# Patient Record
Sex: Female | Born: 1935
Health system: Southern US, Community
[De-identification: ages and names within clinical notes are randomized; demographics above are authoritative.]

## PROBLEM LIST (undated history)

## (undated) DIAGNOSIS — I4891 Unspecified atrial fibrillation: Secondary | ICD-10-CM

## (undated) DIAGNOSIS — E785 Hyperlipidemia, unspecified: Secondary | ICD-10-CM

## (undated) DIAGNOSIS — I639 Cerebral infarction, unspecified: Secondary | ICD-10-CM

## (undated) DIAGNOSIS — I779 Disorder of arteries and arterioles, unspecified: Secondary | ICD-10-CM

## (undated) DIAGNOSIS — I2510 Atherosclerotic heart disease of native coronary artery without angina pectoris: Secondary | ICD-10-CM

## (undated) DIAGNOSIS — I10 Essential (primary) hypertension: Secondary | ICD-10-CM

## (undated) DIAGNOSIS — I739 Peripheral vascular disease, unspecified: Secondary | ICD-10-CM

## (undated) DIAGNOSIS — I6789 Other cerebrovascular disease: Secondary | ICD-10-CM

## (undated) DIAGNOSIS — I422 Other hypertrophic cardiomyopathy: Secondary | ICD-10-CM

## (undated) DIAGNOSIS — F17200 Nicotine dependence, unspecified, uncomplicated: Secondary | ICD-10-CM

## (undated) HISTORY — DX: Peripheral vascular disease, unspecified: I73.9

## (undated) HISTORY — DX: Other hypertrophic cardiomyopathy: I42.2

## (undated) HISTORY — DX: Nicotine dependence, unspecified, uncomplicated: F17.200

## (undated) HISTORY — DX: Other cerebrovascular disease: I67.89

## (undated) HISTORY — DX: Atherosclerotic heart disease of native coronary artery without angina pectoris: I25.10

## (undated) HISTORY — DX: Hyperlipidemia, unspecified: E78.5

## (undated) HISTORY — DX: Essential (primary) hypertension: I10

## (undated) HISTORY — DX: Disorder of arteries and arterioles, unspecified: I77.9

## (undated) HISTORY — PX: VEIN SURGERY: SHX48

---

## 1997-04-19 ENCOUNTER — Encounter: Admission: RE | Admit: 1997-04-19 | Discharge: 1997-04-19 | Payer: Self-pay | Admitting: Family Medicine

## 1997-04-21 ENCOUNTER — Emergency Department (HOSPITAL_COMMUNITY): Admission: EM | Admit: 1997-04-21 | Discharge: 1997-04-21 | Payer: Self-pay | Admitting: Emergency Medicine

## 1997-05-12 ENCOUNTER — Encounter: Admission: RE | Admit: 1997-05-12 | Discharge: 1997-05-12 | Payer: Self-pay | Admitting: Sports Medicine

## 1997-06-01 ENCOUNTER — Encounter: Admission: RE | Admit: 1997-06-01 | Discharge: 1997-06-01 | Payer: Self-pay | Admitting: Family Medicine

## 1997-06-13 ENCOUNTER — Encounter: Admission: RE | Admit: 1997-06-13 | Discharge: 1997-06-13 | Payer: Self-pay | Admitting: Internal Medicine

## 1997-06-15 ENCOUNTER — Encounter: Admission: RE | Admit: 1997-06-15 | Discharge: 1997-09-13 | Payer: Self-pay | Admitting: Internal Medicine

## 1997-11-24 ENCOUNTER — Encounter: Admission: RE | Admit: 1997-11-24 | Discharge: 1997-11-24 | Payer: Self-pay | Admitting: Family Medicine

## 1997-12-11 ENCOUNTER — Encounter: Payer: Self-pay | Admitting: Family Medicine

## 1997-12-11 ENCOUNTER — Inpatient Hospital Stay (HOSPITAL_COMMUNITY): Admission: EM | Admit: 1997-12-11 | Discharge: 1997-12-19 | Payer: Self-pay | Admitting: Emergency Medicine

## 1997-12-11 ENCOUNTER — Encounter: Payer: Self-pay | Admitting: Emergency Medicine

## 1997-12-14 ENCOUNTER — Encounter: Payer: Self-pay | Admitting: Family Medicine

## 1997-12-29 ENCOUNTER — Encounter: Payer: Self-pay | Admitting: Pulmonary Disease

## 1997-12-29 ENCOUNTER — Ambulatory Visit (HOSPITAL_COMMUNITY): Admission: RE | Admit: 1997-12-29 | Discharge: 1997-12-29 | Payer: Self-pay | Admitting: Pulmonary Disease

## 1998-01-16 ENCOUNTER — Encounter: Admission: RE | Admit: 1998-01-16 | Discharge: 1998-01-16 | Payer: Self-pay | Admitting: Sports Medicine

## 1998-01-29 ENCOUNTER — Encounter: Admission: RE | Admit: 1998-01-29 | Discharge: 1998-02-01 | Payer: Self-pay | Admitting: Internal Medicine

## 1998-02-14 ENCOUNTER — Encounter: Admission: RE | Admit: 1998-02-14 | Discharge: 1998-05-15 | Payer: Self-pay | Admitting: Internal Medicine

## 1998-02-15 ENCOUNTER — Encounter: Payer: Self-pay | Admitting: Pulmonary Disease

## 1998-02-15 ENCOUNTER — Ambulatory Visit (HOSPITAL_COMMUNITY): Admission: RE | Admit: 1998-02-15 | Discharge: 1998-02-15 | Payer: Self-pay | Admitting: Pulmonary Disease

## 1998-02-21 ENCOUNTER — Ambulatory Visit (HOSPITAL_COMMUNITY): Admission: RE | Admit: 1998-02-21 | Discharge: 1998-02-21 | Payer: Self-pay | Admitting: Pulmonary Disease

## 1998-02-21 ENCOUNTER — Encounter: Payer: Self-pay | Admitting: Pulmonary Disease

## 1998-02-26 ENCOUNTER — Encounter: Payer: Self-pay | Admitting: Surgery

## 1998-02-26 ENCOUNTER — Inpatient Hospital Stay (HOSPITAL_COMMUNITY): Admission: RE | Admit: 1998-02-26 | Discharge: 1998-02-28 | Payer: Self-pay | Admitting: Surgery

## 1998-08-14 ENCOUNTER — Encounter (INDEPENDENT_AMBULATORY_CARE_PROVIDER_SITE_OTHER): Payer: Self-pay | Admitting: *Deleted

## 1998-09-10 ENCOUNTER — Encounter: Admission: RE | Admit: 1998-09-10 | Discharge: 1998-09-10 | Payer: Self-pay | Admitting: Family Medicine

## 1999-03-18 ENCOUNTER — Encounter: Admission: RE | Admit: 1999-03-18 | Discharge: 1999-03-18 | Payer: Self-pay | Admitting: Sports Medicine

## 1999-03-18 ENCOUNTER — Encounter: Payer: Self-pay | Admitting: Sports Medicine

## 1999-11-21 ENCOUNTER — Encounter: Admission: RE | Admit: 1999-11-21 | Discharge: 1999-11-21 | Payer: Self-pay | Admitting: Family Medicine

## 2000-11-24 ENCOUNTER — Encounter: Admission: RE | Admit: 2000-11-24 | Discharge: 2000-11-24 | Payer: Self-pay | Admitting: Family Medicine

## 2000-11-30 ENCOUNTER — Encounter: Admission: RE | Admit: 2000-11-30 | Discharge: 2000-11-30 | Payer: Self-pay | Admitting: Sports Medicine

## 2001-06-07 ENCOUNTER — Encounter: Admission: RE | Admit: 2001-06-07 | Discharge: 2001-06-07 | Payer: Self-pay | Admitting: Sports Medicine

## 2001-06-15 ENCOUNTER — Encounter: Payer: Self-pay | Admitting: Sports Medicine

## 2001-06-15 ENCOUNTER — Encounter: Admission: RE | Admit: 2001-06-15 | Discharge: 2001-06-15 | Payer: Self-pay | Admitting: Sports Medicine

## 2001-08-05 ENCOUNTER — Encounter: Admission: RE | Admit: 2001-08-05 | Discharge: 2001-08-05 | Payer: Self-pay | Admitting: Family Medicine

## 2001-08-05 ENCOUNTER — Encounter: Payer: Self-pay | Admitting: Surgery

## 2001-08-05 ENCOUNTER — Ambulatory Visit (HOSPITAL_COMMUNITY): Admission: RE | Admit: 2001-08-05 | Discharge: 2001-08-05 | Payer: Self-pay | Admitting: Surgery

## 2001-11-19 ENCOUNTER — Encounter: Admission: RE | Admit: 2001-11-19 | Discharge: 2001-11-19 | Payer: Self-pay | Admitting: Family Medicine

## 2002-01-04 ENCOUNTER — Encounter: Admission: RE | Admit: 2002-01-04 | Discharge: 2002-01-04 | Payer: Self-pay | Admitting: Sports Medicine

## 2002-11-11 ENCOUNTER — Encounter: Admission: RE | Admit: 2002-11-11 | Discharge: 2002-11-11 | Payer: Self-pay | Admitting: Family Medicine

## 2003-10-31 ENCOUNTER — Ambulatory Visit: Payer: Self-pay | Admitting: Family Medicine

## 2004-01-29 ENCOUNTER — Ambulatory Visit: Payer: Self-pay | Admitting: Sports Medicine

## 2004-09-11 ENCOUNTER — Ambulatory Visit: Payer: Self-pay | Admitting: Family Medicine

## 2004-10-10 ENCOUNTER — Ambulatory Visit: Payer: Self-pay | Admitting: Family Medicine

## 2004-11-11 ENCOUNTER — Ambulatory Visit: Payer: Self-pay | Admitting: Sports Medicine

## 2004-11-28 ENCOUNTER — Ambulatory Visit: Payer: Self-pay | Admitting: Cardiology

## 2004-11-28 ENCOUNTER — Encounter: Payer: Self-pay | Admitting: Cardiology

## 2004-11-28 ENCOUNTER — Ambulatory Visit (HOSPITAL_COMMUNITY): Admission: RE | Admit: 2004-11-28 | Discharge: 2004-11-28 | Payer: Self-pay | Admitting: Sports Medicine

## 2004-12-11 ENCOUNTER — Ambulatory Visit: Payer: Self-pay | Admitting: Family Medicine

## 2005-04-10 ENCOUNTER — Ambulatory Visit: Payer: Self-pay | Admitting: Family Medicine

## 2005-09-24 ENCOUNTER — Ambulatory Visit: Payer: Self-pay | Admitting: Sports Medicine

## 2005-10-08 ENCOUNTER — Ambulatory Visit: Payer: Self-pay | Admitting: Family Medicine

## 2005-10-16 ENCOUNTER — Ambulatory Visit: Payer: Self-pay | Admitting: Family Medicine

## 2005-10-24 ENCOUNTER — Ambulatory Visit: Payer: Self-pay | Admitting: Family Medicine

## 2005-11-04 ENCOUNTER — Ambulatory Visit: Payer: Self-pay | Admitting: Family Medicine

## 2005-11-25 HISTORY — PX: NM MYOCAR PERF WALL MOTION: HXRAD629

## 2006-01-27 ENCOUNTER — Inpatient Hospital Stay (HOSPITAL_COMMUNITY): Admission: AD | Admit: 2006-01-27 | Discharge: 2006-01-28 | Payer: Self-pay | Admitting: Cardiology

## 2006-01-27 HISTORY — PX: CARDIAC CATHETERIZATION: SHX172

## 2006-03-12 DIAGNOSIS — B351 Tinea unguium: Secondary | ICD-10-CM

## 2006-03-12 DIAGNOSIS — I70219 Atherosclerosis of native arteries of extremities with intermittent claudication, unspecified extremity: Secondary | ICD-10-CM

## 2006-03-12 DIAGNOSIS — M1710 Unilateral primary osteoarthritis, unspecified knee: Secondary | ICD-10-CM

## 2006-03-12 DIAGNOSIS — I10 Essential (primary) hypertension: Secondary | ICD-10-CM | POA: Insufficient documentation

## 2006-03-12 DIAGNOSIS — E7800 Pure hypercholesterolemia, unspecified: Secondary | ICD-10-CM

## 2006-03-12 DIAGNOSIS — I2510 Atherosclerotic heart disease of native coronary artery without angina pectoris: Secondary | ICD-10-CM | POA: Insufficient documentation

## 2006-03-12 DIAGNOSIS — F068 Other specified mental disorders due to known physiological condition: Secondary | ICD-10-CM

## 2006-03-12 DIAGNOSIS — I6789 Other cerebrovascular disease: Secondary | ICD-10-CM

## 2006-03-12 DIAGNOSIS — J449 Chronic obstructive pulmonary disease, unspecified: Secondary | ICD-10-CM | POA: Insufficient documentation

## 2006-03-12 DIAGNOSIS — F17200 Nicotine dependence, unspecified, uncomplicated: Secondary | ICD-10-CM

## 2006-03-12 DIAGNOSIS — I739 Peripheral vascular disease, unspecified: Secondary | ICD-10-CM

## 2006-03-12 HISTORY — DX: Nicotine dependence, unspecified, uncomplicated: F17.200

## 2006-03-12 HISTORY — DX: Other cerebrovascular disease: I67.89

## 2006-03-13 ENCOUNTER — Encounter (INDEPENDENT_AMBULATORY_CARE_PROVIDER_SITE_OTHER): Payer: Self-pay | Admitting: *Deleted

## 2006-04-17 ENCOUNTER — Encounter (INDEPENDENT_AMBULATORY_CARE_PROVIDER_SITE_OTHER): Payer: Self-pay | Admitting: Family Medicine

## 2006-06-15 ENCOUNTER — Telehealth: Payer: Self-pay | Admitting: *Deleted

## 2006-06-29 ENCOUNTER — Telehealth (INDEPENDENT_AMBULATORY_CARE_PROVIDER_SITE_OTHER): Payer: Self-pay | Admitting: Family Medicine

## 2006-08-03 ENCOUNTER — Telehealth (INDEPENDENT_AMBULATORY_CARE_PROVIDER_SITE_OTHER): Payer: Self-pay | Admitting: Family Medicine

## 2006-08-04 ENCOUNTER — Telehealth (INDEPENDENT_AMBULATORY_CARE_PROVIDER_SITE_OTHER): Payer: Self-pay | Admitting: *Deleted

## 2006-08-12 ENCOUNTER — Encounter (INDEPENDENT_AMBULATORY_CARE_PROVIDER_SITE_OTHER): Payer: Self-pay | Admitting: Family Medicine

## 2006-08-12 ENCOUNTER — Ambulatory Visit: Payer: Self-pay | Admitting: Family Medicine

## 2006-08-12 DIAGNOSIS — I2510 Atherosclerotic heart disease of native coronary artery without angina pectoris: Secondary | ICD-10-CM | POA: Insufficient documentation

## 2006-08-12 LAB — CONVERTED CEMR LAB
ALT: <8 units/L (ref 0–35)
BUN: 15 mg/dL (ref 6–23)
CO2: 27 meq/L (ref 19–32)
Chloride: 110 meq/L (ref 96–112)
Creatinine, Ser: 0.95 mg/dL (ref 0.40–1.20)
Direct LDL: 117 mg/dL — ABNORMAL HIGH
Glucose, Bld: 98 mg/dL (ref 70–99)
HCT: 35.6 % — ABNORMAL LOW (ref 36.0–46.0)
Hemoglobin: 11.7 g/dL — ABNORMAL LOW (ref 12.0–15.0)
MCHC: 32.9 g/dL (ref 30.0–36.0)
MCV: 93.7 fL (ref 78.0–100.0)
Platelets: 193 10*3/uL (ref 150–400)
RDW: 13.6 % (ref 11.5–14.0)
Total Bilirubin: 0.5 mg/dL (ref 0.3–1.2)
Total Protein: 7.3 g/dL (ref 6.0–8.3)

## 2006-11-20 ENCOUNTER — Ambulatory Visit: Payer: Self-pay | Admitting: Family Medicine

## 2007-06-11 ENCOUNTER — Encounter (INDEPENDENT_AMBULATORY_CARE_PROVIDER_SITE_OTHER): Payer: Self-pay | Admitting: Family Medicine

## 2007-06-11 ENCOUNTER — Ambulatory Visit: Payer: Self-pay | Admitting: Family Medicine

## 2007-06-14 LAB — CONVERTED CEMR LAB
BUN: 11 mg/dL (ref 6–23)
Calcium: 9.2 mg/dL (ref 8.4–10.5)
Creatinine, Ser: 0.69 mg/dL (ref 0.40–1.20)
Hemoglobin: 11.6 g/dL — ABNORMAL LOW (ref 12.0–15.0)
Platelets: 201 10*3/uL (ref 150–400)
Triglycerides: 46 mg/dL (ref <150)
WBC: 3.6 10*3/uL — ABNORMAL LOW (ref 4.0–10.5)

## 2007-10-27 ENCOUNTER — Ambulatory Visit: Payer: Self-pay | Admitting: Family Medicine

## 2007-11-29 ENCOUNTER — Telehealth: Payer: Self-pay | Admitting: *Deleted

## 2007-12-24 ENCOUNTER — Telehealth: Payer: Self-pay | Admitting: *Deleted

## 2008-01-05 ENCOUNTER — Telehealth: Payer: Self-pay | Admitting: *Deleted

## 2008-01-24 ENCOUNTER — Ambulatory Visit: Payer: Self-pay | Admitting: Family Medicine

## 2008-02-01 ENCOUNTER — Encounter: Payer: Self-pay | Admitting: Family Medicine

## 2008-07-18 ENCOUNTER — Ambulatory Visit: Payer: Self-pay | Admitting: Family Medicine

## 2008-10-09 ENCOUNTER — Encounter: Payer: Self-pay | Admitting: Family Medicine

## 2008-10-12 ENCOUNTER — Encounter: Admission: RE | Admit: 2008-10-12 | Discharge: 2008-10-12 | Payer: Self-pay | Admitting: Family Medicine

## 2008-10-16 ENCOUNTER — Encounter: Payer: Self-pay | Admitting: Family Medicine

## 2008-10-23 ENCOUNTER — Ambulatory Visit: Payer: Self-pay | Admitting: Family Medicine

## 2008-12-03 ENCOUNTER — Encounter: Payer: Self-pay | Admitting: Family Medicine

## 2008-12-06 ENCOUNTER — Encounter: Payer: Self-pay | Admitting: Family Medicine

## 2008-12-19 ENCOUNTER — Encounter: Payer: Self-pay | Admitting: Family Medicine

## 2009-02-05 ENCOUNTER — Telehealth: Payer: Self-pay | Admitting: Family Medicine

## 2009-07-10 ENCOUNTER — Ambulatory Visit: Payer: Self-pay | Admitting: Family Medicine

## 2009-08-12 ENCOUNTER — Emergency Department (HOSPITAL_COMMUNITY): Admission: EM | Admit: 2009-08-12 | Discharge: 2009-08-12 | Payer: Self-pay | Admitting: Emergency Medicine

## 2009-10-26 ENCOUNTER — Ambulatory Visit: Payer: Self-pay | Admitting: Family Medicine

## 2009-12-10 ENCOUNTER — Encounter: Payer: Self-pay | Admitting: Family Medicine

## 2009-12-10 DIAGNOSIS — I5032 Chronic diastolic (congestive) heart failure: Secondary | ICD-10-CM

## 2010-02-02 ENCOUNTER — Encounter: Payer: Self-pay | Admitting: Family Medicine

## 2010-02-14 NOTE — Assessment & Plan Note (Signed)
Summary: flu shot,tcb   Nurse Visit   Vital Signs:  Patient profile:   77 year old female Temp:     98.4 degrees F  Vitals Entered By: Theresia Lo RN (October 26, 2009 3:10 PM)  Allergies: No Known Drug Allergies  Immunizations Administered:  Influenza Vaccine # 1:    Vaccine Type: Fluvax MCR    Site: right deltoid    Mfr: GlaxoSmithKline    Dose: 0.5 ml    Route: IM    Given by: Theresia Lo RN    Exp. Date: 07/10/2010    Lot #: [id number]    VIS given: 08/07/09 version given October 26, 2009.  Flu Vaccine Consent Questions:    Do you have a history of severe allergic reactions to this vaccine? no    Any prior history of allergic reactions to egg and/or gelatin? no    Do you have a sensitivity to the preservative Thimersol? no    Do you have a past history of Guillan-Barre Syndrome? no    Do you currently have an acute febrile illness? no    Have you ever had a severe reaction to latex? no    Vaccine information given and explained to patient? yes    Are you currently pregnant? no  Orders Added: 1)  Influenza Vaccine MCR [00025] 2)  Administration Flu vaccine - MCR [G0008]     Vital Signs:  Patient profile:   77 year old female Temp:     98.4 degrees F  Vitals Entered By: Theresia Lo RN (October 26, 2009 3:10 PM)

## 2010-02-14 NOTE — Miscellaneous (Signed)
Summary: QI project   Clinical Lists Changes  Problems: Removed problem of NEED PROPHYLACTIC VACCINATION&INOCULATION FLU (ICD-V04.81) Removed problem of NEED PROPHYLACTIC VACCINATION&INOCULATION FLU (ICD-V04.81) Removed problem of CHF, EJECTION FRACTION > OR = 50% (ICD-428.32) Added new problem of CHRONIC DIASTOLIC HEART FAILURE (ICD-428.32)

## 2010-02-14 NOTE — Progress Notes (Signed)
Summary: Rx Req   Phone Note Refill Request Call back at [phone] Message from:  Tilman Neat Neice  Refills Requested: Medication #1:  COMBIVENT 103-18 MCG/ACT  AERO 1 puff qid PT [facility]  Initial call taken by: Clydell Hakim,  February 05, 2009 3:40 PM  Follow-up for Phone Call        will forward to MD. Follow-up by: Theresia Lo RN,  February 05, 2009 3:45 PM    Prescriptions: COMBIVENT 103-18 MCG/ACT  AERO (ALBUTEROL-IPRATROPIUM) 1 puff qid  #1 x 1   Entered and Authorized by:   Angeline Slim MD   Signed by:   Angeline Slim MD on 02/05/2009   Method used:   [facility]ically to        [facility] [address] [phone]* (retail)       [address]       [place], [place]  [address]       Ph: [phone] or [phone]       Fax: [phone]   RxID:   [id number]

## 2010-02-14 NOTE — Assessment & Plan Note (Signed)
Summary: knee pain,tcb   Vital Signs:  Patient profile:   77 year old female Weight:      130.9 pounds Temp:     97.9 degrees F oral Pulse rate:   57 / minute Pulse rhythm:   regular BP sitting:   153 / 66  (right arm)  Vitals Entered By: Loralee Pacas CMA (July 10, 2009 10:10 AM)  Serial Vital Signs/Assessments:  Time      Position  BP       Pulse  Resp  Temp     By                     136/60                         Rodney Langton MD   Primary Care Provider:  Jamie Brookes MD   History of Present Illness: Pt here with c/o L knee pain.  Pain:  has actually since resolved.  Not taking ibuprofen but is taking ASA 325 regularly.  Pain was random, not associated with activity, no swelling, no redness, no fevers/chills, no injury, no popping/catching/locking.  Was present posteromedial knee.  Hx blood clot.  HTN:  BP elevated today.  CAD:  Was taking ASA 81 before switching to 325.  Current Medications (verified): 1)  Norvasc 10 Mg  Tabs (Amlodipine Besylate) .Marland Kitchen.. 1 Tab By Mouth Daily 2)  Zocor 40 Mg  Tabs (Simvastatin) .Marland Kitchen.. 1 Tab By Mouth Daily 3)  Imdur 30 Mg  Tb24 (Isosorbide Mononitrate) .Marland Kitchen.. 1 Tab By Mouth Daily 4)  Hydrochlorothiazide 25 Mg  Tabs (Hydrochlorothiazide) .Marland Kitchen.. 1 Tab By Mouth Daily 5)  Combivent 103-18 Mcg/act  Aero (Albuterol-Ipratropium) .Marland Kitchen.. 1 Puff Qid 6)  Lopressor 50 Mg  Tabs (Metoprolol Tartrate) .Marland Kitchen.. 1 Tab By Mouth Two Times A Day. 7)  Adult Aspirin Ec Low Strength 81 Mg  Tbec (Aspirin) .Marland Kitchen.. 1 Tab By Mouth Daily 8)  Oscal 500/200 D-3 500-200 Mg-Unit  Tabs (Calcium-Vitamin D) .Marland Kitchen.. 1 Tab By Mouth Bid 9)  Tylenol Arthritis Pain 650 Mg Cr-Tabs (Acetaminophen) .... One Tab By Mouth Q8h As Needed For Knee Pain  Allergies (verified): No Known Drug Allergies  Physical Exam  General:  Well-developed,well-nourished,in no acute distress; alert,appropriate and cooperative throughout examination Lungs:  Normal respiratory effort, chest expands  symmetrically. Lungs are clear to auscultation, no crackles or wheezes. Heart:  Normal rate and regular rhythm. S1 and S2 normal without gallop, murmur, click, rub or other extra sounds. Msk:  Knee:L Normal to inspection with no erythema or effusion or obvious bony abnormalities. Palpation normal with no warmth or joint line tenderness or patellar tenderness or condyle tenderness. ROM normal in flexion and extension and lower leg rotation. Ligaments with solid consistent endpoints including ACL, PCL, LCL, MCL. Negative Mcmurray's and provocative meniscal tests. Non painful patellar compression. Patellar and quadriceps tendons unremarkable. Hamstring and quadriceps strength is normal.     Pt pointed to spot where it was originally tender which appears to be over the medial head of the gastrocnemius.  No longer tender.  Homan's Negative.   Impression & Recommendations:  Problem # 1:  OSTEOARTHRITIS, LOWER LEG (ICD-715.96) Assessment Improved Posteromedial joint OA may be the cause of her knee pain,  all ligamentous structures intact, pain gone with NSAIDS so no further tx.  Would like her to go back to ASA 81 and try tylenol 650 for her knee pain as gastritis/pud is a concern  in this elderly lady.  She can RTC if pain recurs  on tylenol and I would be happy to try a therapeutic/diagnostic knee injection.  Her updated medication list for this problem includes:    Adult Aspirin Ec Low Strength 81 Mg Tbec (Aspirin) .Marland Kitchen... 1 tab by mouth daily    Tylenol Arthritis Pain 650 Mg Cr-tabs (Acetaminophen) ..... One tab by mouth q8h as needed for knee pain  Orders: [facility]- Est  Level 4 (16109)  Problem # 2:  CORONARY ARTERY DISEASE (ICD-414.00) Assessment: Unchanged Back to ASA 81.  Her updated medication list for this problem includes:    Norvasc 10 Mg Tabs (Amlodipine besylate) .Marland Kitchen... 1 tab by mouth daily    Imdur 30 Mg Tb24 (Isosorbide mononitrate) .Marland Kitchen... 1 tab by mouth daily     Hydrochlorothiazide 25 Mg Tabs (Hydrochlorothiazide) .Marland Kitchen... 1 tab by mouth daily    Lopressor 50 Mg Tabs (Metoprolol tartrate) .Marland Kitchen... 1 tab by mouth two times a day.    Adult Aspirin Ec Low Strength 81 Mg Tbec (Aspirin) .Marland Kitchen... 1 tab by mouth daily  Orders: [facility]- Est  Level 4 (60454)  Problem # 3:  HYPERTENSION, BENIGN SYSTEMIC (ICD-401.1) Assessment: Improved BP better on recheck, no changes.  Her updated medication list for this problem includes:    Norvasc 10 Mg Tabs (Amlodipine besylate) .Marland Kitchen... 1 tab by mouth daily    Hydrochlorothiazide 25 Mg Tabs (Hydrochlorothiazide) .Marland Kitchen... 1 tab by mouth daily    Lopressor 50 Mg Tabs (Metoprolol tartrate) .Marland Kitchen... 1 tab by mouth two times a day.  Orders: [facility]- Est  Level 4 (99214)  Complete Medication List: 1)  Norvasc 10 Mg Tabs (Amlodipine besylate) .Marland Kitchen.. 1 tab by mouth daily 2)  Zocor 40 Mg Tabs (Simvastatin) .Marland Kitchen.. 1 tab by mouth daily 3)  Imdur 30 Mg Tb24 (Isosorbide mononitrate) .Marland Kitchen.. 1 tab by mouth daily 4)  Hydrochlorothiazide 25 Mg Tabs (Hydrochlorothiazide) .Marland Kitchen.. 1 tab by mouth daily 5)  Combivent 103-18 Mcg/act Aero (Albuterol-ipratropium) .Marland Kitchen.. 1 puff qid 6)  Lopressor 50 Mg Tabs (Metoprolol tartrate) .Marland Kitchen.. 1 tab by mouth two times a day. 7)  Adult Aspirin Ec Low Strength 81 Mg Tbec (Aspirin) .Marland Kitchen.. 1 tab by mouth daily 8)  Oscal 500/200 D-3 500-200 Mg-unit Tabs (Calcium-vitamin d) .Marland Kitchen.. 1 tab by mouth bid 9)  Tylenol Arthritis Pain 650 Mg Cr-tabs (Acetaminophen) .... One tab by mouth q8h as needed for knee pain  Patient Instructions: 1)  Great to meet you today, 2)  Stop your aspirin 325 mg and go back to a baby aspirin (81mg ) a day for now, try tylenol 650 if your knee pain comes back. 3)  -Dr. Karie Schwalbe. Prescriptions: TYLENOL ARTHRITIS PAIN 650 MG CR-TABS (ACETAMINOPHEN) One tab by mouth q8h as needed for knee pain  #30 x 6   Entered and Authorized by:   Rodney Langton MD   Signed by:   Rodney Langton MD on 07/10/2009   Method used:   Print  then Give to Patient   RxID:   [id number]

## 2010-03-12 ENCOUNTER — Other Ambulatory Visit: Payer: Self-pay | Admitting: Family Medicine

## 2010-03-12 NOTE — Telephone Encounter (Signed)
Refill request

## 2010-04-11 ENCOUNTER — Other Ambulatory Visit: Payer: Self-pay | Admitting: Family Medicine

## 2010-04-11 ENCOUNTER — Encounter: Payer: Self-pay | Admitting: Family Medicine

## 2010-04-11 ENCOUNTER — Ambulatory Visit (INDEPENDENT_AMBULATORY_CARE_PROVIDER_SITE_OTHER): Payer: Medicare Other | Admitting: Family Medicine

## 2010-04-11 VITALS — BP 185/70 | HR 64 | Temp 97.6°F | Wt 132.0 lb

## 2010-04-11 DIAGNOSIS — I10 Essential (primary) hypertension: Secondary | ICD-10-CM

## 2010-04-11 DIAGNOSIS — R2689 Other abnormalities of gait and mobility: Secondary | ICD-10-CM | POA: Insufficient documentation

## 2010-04-11 DIAGNOSIS — R269 Unspecified abnormalities of gait and mobility: Secondary | ICD-10-CM

## 2010-04-11 MED ORDER — ISOSORBIDE MONONITRATE ER 30 MG PO TB24: 30.0000 mg | ORAL_TABLET | Freq: Every day | ORAL | Status: DC

## 2010-04-11 MED ORDER — METOPROLOL TARTRATE 50 MG PO TABS: 50.0000 mg | ORAL_TABLET | Freq: Two times a day (BID) | ORAL | Status: DC

## 2010-04-11 NOTE — Patient Instructions (Signed)
I will call you with results.  Keep exercising. And use the medicines as directed including the inhaler.

## 2010-04-12 LAB — BASIC METABOLIC PANEL
BUN: 14 mg/dL (ref 6–23)
Calcium: 9.2 mg/dL (ref 8.4–10.5)
Chloride: 107 mEq/L (ref 96–112)
Potassium: 4 mEq/L (ref 3.5–5.3)
Sodium: 142 mEq/L (ref 135–145)

## 2010-04-12 LAB — CBC
Hemoglobin: 12.2 g/dL (ref 12.0–15.0)
Platelets: 172 10*3/uL (ref 150–400)
RDW: 13.7 % (ref 11.5–15.5)
WBC: 4.8 10*3/uL (ref 4.0–10.5)

## 2010-04-15 ENCOUNTER — Telehealth: Payer: Self-pay | Admitting: *Deleted

## 2010-04-15 NOTE — Telephone Encounter (Signed)
Informed of lab work being normal

## 2010-04-15 NOTE — Telephone Encounter (Signed)
Message copied by Jimmy Footman on Mon Apr 15, 2010  9:32 AM ------      Message from: Clotilde Dieter, [place]      Created: Mon Apr 15, 2010  9:05 AM      Regarding: Please call this patient.        Please let this patient know that her labs and completely normal.       Thanks.             ----- Message -----         From: Lab In [facility] Interface         Sent: 04/12/2010   2:37 AM           To: Jamie Brookes

## 2010-04-15 NOTE — Assessment & Plan Note (Signed)
Pt has a high BP today. Unclear why it is so high as the patient says she is taking all her meds.  I am concerned with her slow gait and left knee pain that if I drop her BP she may also become dizzy and unbalanced. Will not change her meds at this time but if it is high the next time she comes in I would add a 4th medication or increase her Metoprolol.

## 2010-04-15 NOTE — Assessment & Plan Note (Signed)
Pt has some left knee pain off and on for many months if not years (daughter and sister disagree). She fell about 4 months ago but was hurting even before that. She does not use a walking or cane and generally has good balance when she is walking. She is just slow. Advised to not stop moving and to keep going to the gym with her sister. Pt is not SOB and oxygen while sitting and walking are above 92%.  No new care plans at this time.

## 2010-04-15 NOTE — Progress Notes (Signed)
Knee pain and unsteady gait: Pt has had a slow gait and left knee pain for several months if not years. Her daughter and her sister do not agree on the timeframe but it is clear that she has OA and is taking Tylenol for her pain. She did fall about 4 months ago but says that her knee was hurting before that as well. She is continuing to go to a gym and exercise classes but is concerned because she is the slowest person there. She wants to know if there is something wrong with her. She says she use to smoke for about 60 years but does not necessarily feel short of breath.   HTN: Pt has a high BP today. She is taking all her medications and has brought them in today. Her BP is high today but with her story of a fall a few months ago and her feeling even more slow, weak and with unsteady gait/knee pain she likely does not need low blood pressure as well.   ROS: neg except for aches and pains with her joints at times. She has OA.   PE:  Gen: NAD, sitting on bed comfortably.  HEENT: [place]/AT, eye conjunctiva is normal and pink on the lower lid. EOMI, PERRL CV: RRR, no murmurs Pulm: CTAB, no wheezes  Ext: some left knee tenderness and enlargement, no significant swelling, no erythema or fluctuance.

## 2010-04-17 ENCOUNTER — Encounter: Payer: Self-pay | Admitting: Home Health Services

## 2010-05-04 ENCOUNTER — Other Ambulatory Visit: Payer: Self-pay | Admitting: Family Medicine

## 2010-05-05 NOTE — Telephone Encounter (Signed)
Refill request

## 2010-05-14 ENCOUNTER — Other Ambulatory Visit: Payer: Self-pay | Admitting: Family Medicine

## 2010-05-14 NOTE — Telephone Encounter (Signed)
Refill request

## 2010-05-31 NOTE — Discharge Summary (Signed)
Bethany Cardenas, Bethany Cardenas               ACCOUNT NO.:  [id number]   MEDICAL RECORD NO.:  [id number]          PATIENT TYPE:  INP   LOCATION:  3735                         FACILITY:  [facility]   PHYSICIAN:  Madaline Savage, M.D.DATE OF BIRTH:  1922-10-20   DATE OF ADMISSION:  01/27/2006  DATE OF DISCHARGE:  01/28/2006                               DISCHARGE SUMMARY   DISCHARGE DIAGNOSIS:  1. Severe three vessel coronary disease with an 80% left main and      normal left ventricular function not felt to be an operative      candidate.  2. Known peripheral vascular disease.  3. Treated hypertension with ACE inhibitor intolerance.  4. Treated dyslipidemia.  5. Chronic obstructive pulmonary disease.   HOSPITAL COURSE:  The patient is a pleasant 77 year old female who is  followed by Dr. Deirdre Priest.  She was referred to Dr. Elsie Lincoln for cardiac  evaluation.  She had an outpatient Cardiolite which was abnormal.  She  also had markedly diminished ABIs at 0.42 on the right and 0.39 on the  left.  She has moderate disease in her internal carotids bilaterally.  She was set up for diagnostic catheterization as an outpatient which was  done January 27, 2006.  This revealed total native LAD, total native  circumflex, total native RCA.  There was some right to right collaterals  and some right to left collaterals.  Her EF was 60%.  There was also an  80% left main stenosis noted.  The patient was transferred to [place] and put on heparin and nitrates.  She was seen in consult by  Dr. Rexanne Mano.  Dr. Laneta Simmers felt ultimately that she was a poor  candidate for bypass surgery.  She has had a prior stroke, she has  diffuse vascular disease, there is some degree of dementia, and she has  COPD.  The plan is for medical therapy.  We did add nitrate.  We feel  she can be discharged January 28, 2006.   DISCHARGE MEDICATIONS:  HCTZ 25 mg a day, Toprol XL 100 mg a day, Pletal  100 mg b.i.d., simvastatin  40 mg a day, coated aspirin daily, Norvasc 10  mg a day, Imdur 30 mg a day, nitroglycerin sublingual p.r.n.   LABORATORY DATA:  White count 6.6, hemoglobin 10.9, hematocrit 33.2,  platelets 158.  Sodium 139, potassium 4, BUN 10, creatinine 0.8.  Liver  functions were normal.  LDL was 71.  TSH is pending.   DISPOSITION:  The patient is discharged in stable condition.  She lives  with her sister.  She will follow up with Dr. gamble and Dr. Deirdre Priest  as an outpatient.      Abelino Derrick, P.A.    ______________________________  Madaline Savage, M.D.    Lenard Lance  D:  01/28/2006  T:  01/28/2006  Job:  [id number]   cc:   Pearlean Brownie, M.D.  Evelene Croon, M.D.

## 2010-05-31 NOTE — H&P (Signed)
Bethany Cardenas, Bethany Cardenas               ACCOUNT NO.:  [id number]   MEDICAL RECORD NO.:  [id number]          PATIENT TYPE:  INP   LOCATION:  3735                         FACILITY:  [facility]   PHYSICIAN:  Madaline Savage, M.D.DATE OF BIRTH:  06/05/1922   DATE OF ADMISSION:  01/27/2006  DATE OF DISCHARGE:  01/28/2006                              HISTORY & PHYSICAL   Dictation ended at this point.      Abelino Derrick, P.A.    ______________________________  Madaline Savage, M.D.    Lenard Lance  D:  03/17/2006  T:  03/17/2006  Job:  [id number]

## 2010-08-15 ENCOUNTER — Other Ambulatory Visit: Payer: Self-pay | Admitting: Family Medicine

## 2010-08-15 NOTE — Telephone Encounter (Signed)
Refill request

## 2010-08-16 NOTE — Telephone Encounter (Signed)
Refill request

## 2010-11-14 ENCOUNTER — Ambulatory Visit (INDEPENDENT_AMBULATORY_CARE_PROVIDER_SITE_OTHER): Payer: Medicare Other | Admitting: *Deleted

## 2010-11-14 DIAGNOSIS — Z23 Encounter for immunization: Secondary | ICD-10-CM

## 2011-01-16 ENCOUNTER — Other Ambulatory Visit: Payer: Self-pay | Admitting: Family Medicine

## 2011-01-16 NOTE — Telephone Encounter (Signed)
Refill request

## 2011-01-24 DIAGNOSIS — Q828 Other specified congenital malformations of skin: Secondary | ICD-10-CM | POA: Diagnosis not present

## 2011-01-24 DIAGNOSIS — M79609 Pain in unspecified limb: Secondary | ICD-10-CM | POA: Diagnosis not present

## 2011-01-24 DIAGNOSIS — B351 Tinea unguium: Secondary | ICD-10-CM | POA: Diagnosis not present

## 2011-04-04 DIAGNOSIS — Q828 Other specified congenital malformations of skin: Secondary | ICD-10-CM | POA: Diagnosis not present

## 2011-04-04 DIAGNOSIS — B351 Tinea unguium: Secondary | ICD-10-CM | POA: Diagnosis not present

## 2011-04-04 DIAGNOSIS — M79609 Pain in unspecified limb: Secondary | ICD-10-CM | POA: Diagnosis not present

## 2011-04-25 ENCOUNTER — Ambulatory Visit (INDEPENDENT_AMBULATORY_CARE_PROVIDER_SITE_OTHER): Payer: Medicare Other | Admitting: Family Medicine

## 2011-04-25 ENCOUNTER — Encounter: Payer: Self-pay | Admitting: Family Medicine

## 2011-04-25 VITALS — BP 130/62 | HR 64 | Temp 98.1°F | Wt 132.0 lb

## 2011-04-25 DIAGNOSIS — Z23 Encounter for immunization: Secondary | ICD-10-CM

## 2011-04-25 DIAGNOSIS — I509 Heart failure, unspecified: Secondary | ICD-10-CM

## 2011-04-25 DIAGNOSIS — IMO0002 Reserved for concepts with insufficient information to code with codable children: Secondary | ICD-10-CM | POA: Diagnosis not present

## 2011-04-25 DIAGNOSIS — M1710 Unilateral primary osteoarthritis, unspecified knee: Secondary | ICD-10-CM

## 2011-04-25 DIAGNOSIS — I5032 Chronic diastolic (congestive) heart failure: Secondary | ICD-10-CM | POA: Diagnosis not present

## 2011-04-25 MED ORDER — ACETAMINOPHEN ER 650 MG PO TBCR: 650.0000 mg | EXTENDED_RELEASE_TABLET | Freq: Two times a day (BID) | ORAL | Status: DC

## 2011-04-25 MED ORDER — POTASSIUM CHLORIDE ER 10 MEQ PO TBCR: 20.0000 meq | EXTENDED_RELEASE_TABLET | Freq: Every day | ORAL | Status: DC

## 2011-04-25 MED ORDER — FUROSEMIDE 40 MG PO TABS: 40.0000 mg | ORAL_TABLET | Freq: Every day | ORAL | Status: DC

## 2011-04-25 MED ORDER — ZOSTER VACCINE LIVE 19400 UNT/0.65ML ~~LOC~~ SOLR: 0.6500 mL | Freq: Once | SUBCUTANEOUS | Status: AC

## 2011-04-25 NOTE — Progress Notes (Signed)
  Subjective:    Patient ID: Bethany Cardenas, female    DOB: December 27, 1922, 78 y.o.   MRN: [id number]  HPI 1. SOB Patient has a dx of CHF. She has SOB with minimal exertion. No sob at night. No GERD symptoms. No respiratory distress today. She has an inhaler for COPD as well. No wheezing noted. She has dull sounds on ausculation. She has controlled BP today.   2. Bilateral knee pain Patient has knee pain bilaterally, without joint swelling or injury. She has worse pain in the morning. No other joints. Blood supply appears normal to lower ext. Pulses palpable and skin healthy. Negative homen's sign. She does have trace pitting edema of the lower ext.   Tobacco use: Patient is a non-smoker.   Review of Systems Pertinent items are noted in HPI.     Objective:   Physical Exam Filed Vitals:   04/25/11 1046  BP: 130/62  Pulse: 64  Temp: 98.1 F (36.7 C)  TempSrc: Oral  Weight: 132 lb (59.875 kg)  SpO2: 97%  Neck:  No deformities, thyromegaly, masses, or tenderness noted.   Supple with full range of motion without pain.Marland Kitchen Heart - Regular rate and rhythm.  No murmurs, gallops or rubs.    Lungs:  Chest expands symmetrically. Lungs are dull to auscultation with poor insp. effort, no crackles or wheezes. Extremities:   Non-tender, No cyanosis, edema, or deformity noted. DP and PT pulses palpable. onychomycosis bilateral feet.  Skin:  Intact without suspicious lesions or rashes        Assessment & Plan:

## 2011-04-25 NOTE — Patient Instructions (Signed)
It was great to see you today!  Schedule an appointment to see me in 2 weeks.

## 2011-04-25 NOTE — Progress Notes (Signed)
Addended by: Deno Etienne on: 04/25/2011 01:31 PM   Modules accepted: Orders

## 2011-04-25 NOTE — Assessment & Plan Note (Signed)
C/o bilateral knee pain that is classic for OA Meds ordered this encounter  Medications  . zoster vaccine live, PF, (ZOSTAVAX) 46962 UNT/0.65ML injection    Sig: Inject 19,400 Units into the skin once.    Dispense:  1 each    Refill:  0  . furosemide (LASIX) 40 MG tablet    Sig: Take 1 tablet (40 mg total) by mouth daily.    Dispense:  14 tablet    Refill:  0    14 days of lasix for leg edema, SOB  . potassium chloride (K-DUR) 10 MEQ tablet    Sig: Take 2 tablets (20 mEq total) by mouth daily.    Dispense:  30 tablet    Refill:  0  . acetaminophen (TYLENOL 8 HOUR) 650 MG CR tablet    Sig: Take 1 tablet (650 mg total) by mouth every 12 (twelve) hours.    Dispense:  60 tablet    Refill:  6

## 2011-04-25 NOTE — Assessment & Plan Note (Signed)
Patient has trace pitting edema and sob with minimal exertion. I will treat her with Lasix 40 mg daily for the next two weeks and reevaluate.

## 2011-05-06 ENCOUNTER — Encounter: Payer: Self-pay | Admitting: Family Medicine

## 2011-05-06 ENCOUNTER — Ambulatory Visit (INDEPENDENT_AMBULATORY_CARE_PROVIDER_SITE_OTHER): Payer: Medicare Other | Admitting: Family Medicine

## 2011-05-06 VITALS — BP 132/70 | HR 64 | Temp 97.0°F | Wt 133.0 lb

## 2011-05-06 DIAGNOSIS — I5032 Chronic diastolic (congestive) heart failure: Secondary | ICD-10-CM | POA: Diagnosis not present

## 2011-05-06 DIAGNOSIS — I509 Heart failure, unspecified: Secondary | ICD-10-CM | POA: Diagnosis not present

## 2011-05-06 DIAGNOSIS — R609 Edema, unspecified: Secondary | ICD-10-CM

## 2011-05-06 DIAGNOSIS — R600 Localized edema: Secondary | ICD-10-CM | POA: Insufficient documentation

## 2011-05-06 DIAGNOSIS — M1710 Unilateral primary osteoarthritis, unspecified knee: Secondary | ICD-10-CM

## 2011-05-06 DIAGNOSIS — IMO0002 Reserved for concepts with insufficient information to code with codable children: Secondary | ICD-10-CM | POA: Diagnosis not present

## 2011-05-06 MED ORDER — POTASSIUM CHLORIDE ER 10 MEQ PO TBCR: 20.0000 meq | EXTENDED_RELEASE_TABLET | Freq: Every day | ORAL | Status: DC

## 2011-05-06 MED ORDER — FUROSEMIDE 40 MG PO TABS: 40.0000 mg | ORAL_TABLET | Freq: Every day | ORAL | Status: DC

## 2011-05-06 NOTE — Assessment & Plan Note (Signed)
Continue tylenol 650 mg q 8 hours.  I do not want to prescribe NSAIDS with her chf/edema. I do not feel tramadol or narcotics is a good idea with her unsteady gait, beers criteria.

## 2011-05-06 NOTE — Patient Instructions (Signed)
It was great to see you today!  Schedule an appointment to see me as needed.  Meds ordered this encounter  Medications  . furosemide (LASIX) 40 MG tablet    Sig: Take 1 tablet (40 mg total) by mouth daily.    Dispense:  30 tablet    Refill:  6    Patient to take it PRN leg swelling and SOB. She should take it for a week when the swelling starts.  . potassium chloride (K-DUR) 10 MEQ tablet    Sig: Take 2 tablets (20 mEq total) by mouth daily.    Dispense:  30 tablet    Refill:  6   I have prescribed you LAsix and potassium. Take this when your legs swell or you feel short of breath. Take it for one week at a time.  Please call me if you are short of breath.

## 2011-05-06 NOTE — Assessment & Plan Note (Signed)
Doing better after 2 weeks of lasix. No edema at this time. She will take the lasix one week at a time when she has swelling or SOB.

## 2011-05-06 NOTE — Progress Notes (Signed)
  Subjective:    Patient ID: Bethany Cardenas, female    DOB: [birth year], 78 y.o.   MRN: [id number]  HPI 1. SOB/EDEMA Patient has a dx of CHF. She has completed a 2 week course of lasix. She has resolved SOB with minimal exertion. No sob at night. No GERD symptoms. No respiratory distress today. She has an inhaler for COPD as well. No wheezing noted. She has dull sounds on ausculation. She has slightly elevated BP today.  Her edema in both legs is completely resolved. She has good skin turgor and does not appear dehydrated.  2. Bilateral knee pain Patient has knee pain bilaterally, without joint swelling or injury. She has worse pain in the morning. No other joints. Blood supply appears normal to lower ext. Pulses palpable and skin healthy. Negative homen's sign. She does have trace pitting edema of the lower ext.   Tobacco use: Patient is a non-smoker.   Review of Systems Pertinent items are noted in HPI.     Objective:   Physical Exam Filed Vitals:   05/06/11 0945  BP: 155/68  Pulse: 57  Temp: 97 F (36.1 C)  TempSrc: Oral  Weight: 133 lb (60.328 kg)  Neck:  No deformities, thyromegaly, masses, or tenderness noted.   Supple with full range of motion without pain.Marland Kitchen Heart - Regular rate and rhythm.  No murmurs, gallops or rubs.    Lungs:  Chest expands symmetrically. Lungs are dull to auscultation with poor insp. effort, no crackles or wheezes. Extremities:   Non-tender, No cyanosis, no edema, or deformity noted.  Skin:  Intact without suspicious lesions or rashes    Assessment & Plan:

## 2011-05-06 NOTE — Assessment & Plan Note (Signed)
Doing better after 2 weeks of lasix. No edema at this time. She will take the lasix one week at a time when she has swelling or SOB. 

## 2011-06-05 ENCOUNTER — Ambulatory Visit (INDEPENDENT_AMBULATORY_CARE_PROVIDER_SITE_OTHER): Payer: Medicare Other | Admitting: Family Medicine

## 2011-06-05 ENCOUNTER — Encounter: Payer: Self-pay | Admitting: Family Medicine

## 2011-06-05 VITALS — BP 152/63 | HR 61 | Temp 97.1°F | Wt 131.4 lb

## 2011-06-05 DIAGNOSIS — R609 Edema, unspecified: Secondary | ICD-10-CM

## 2011-06-05 DIAGNOSIS — I509 Heart failure, unspecified: Secondary | ICD-10-CM | POA: Diagnosis not present

## 2011-06-05 DIAGNOSIS — I5032 Chronic diastolic (congestive) heart failure: Secondary | ICD-10-CM

## 2011-06-05 DIAGNOSIS — I10 Essential (primary) hypertension: Secondary | ICD-10-CM

## 2011-06-05 DIAGNOSIS — R600 Localized edema: Secondary | ICD-10-CM

## 2011-06-05 NOTE — Assessment & Plan Note (Signed)
BP Readings from Last 3 Encounters:  06/05/11 152/63  05/06/11 132/70  04/25/11 130/62  I think in an 78 y/o female the risk of lowering her current BP with med adjustments outweighs the benefits.  Will continue current regimen. 6 month follow up.

## 2011-06-05 NOTE — Assessment & Plan Note (Signed)
No edema whatsoever, continue current regimen.

## 2011-06-05 NOTE — Patient Instructions (Signed)
It was great to see you today!  Schedule an appointment to see me in 6 months.  Im glad you have no Shortness of breath or leg swelling.  Your blood pressure was acceptable today.

## 2011-06-05 NOTE — Assessment & Plan Note (Signed)
Doing well. No sob or leg swelling. Continue same plan.

## 2011-06-05 NOTE — Progress Notes (Signed)
  Subjective:   Patient ID: Bethany Cardenas, female DOB: July 25, 1922 78 y.o. MRN: [id number] HPI:  1. CHF Synopsis: well controlled at this time without SOB or edema.  Onset: has been chronic  Time period of: many year(s).  Severity is described as mild-moderate.  Aggravating: unknown Alleviating: lasix Associated sx/sn: no leg swelling, no falls, no syncope, no sob, no chest pain  2. Leg edema Synopsis: likely 2nd to CHF. Controlled with PRN lasix. Currently not present.  Location: lower ext. b/l Onset: has been recurrent and resolved  Time period of: years  Severity is described as mild-moderate.  Aggravating: prolonged standing, CHF exacerbation Alleviating: lasix, walking, elevation of legs.  Associated sx/sn: currently none.   3. HYPERTENSION Disease Monitoring Home BP Monitoring not doing Chest pain- no     Dyspnea-  no  Medications Compliance: taking as prescribed. Lightheadedness-  no  Edema-  no, does occasionally have edema, now well controlled.    [facility] Lab Review   Potassium  Date Value Range Status  04/11/2010 4.0  3.5-5.3 (mEq/L) Final     Sodium  Date Value Range Status  04/11/2010 142  135-145 (mEq/L) Final      History  Substance Use Topics  . Smoking status: Former Games developer  . Smokeless tobacco: Not on file  . Alcohol Use: No    Review of Systems: Pertinent items are noted in HPI.  Labs Reviewed: last BMP/CBC Cr normal Reviewed Chart Review for last notes.     Objective:   Filed Vitals:   06/05/11 0859  BP: 152/63  Pulse: 61  Temp: 97.1 F (36.2 C)  TempSrc: Oral  Weight: 131 lb 6.4 oz (59.603 kg)  SpO2: 96%   Physical Exam: General: aaf, nad, pleasant Lungs:  Normal respiratory effort, chest expands symmetrically. Lungs are clear to auscultation but distant lung sounds, no crackles or wheezes. Heart - Regular rate and rhythm.  No murmurs, gallops or rubs.    Extremities: Non-tender, No cyanosis, edema, or deformity noted. Abdomen: soft  and non-tender without masses, organomegaly or hernias noted.  No guarding or rebound Skin:  Intact without suspicious lesions or rashes Neurologic exam : Cn 2-7 intact Strength equal & normal in upper & lower extremities Able to walk without marked gait irregularity Assessment & Plan:

## 2011-07-05 ENCOUNTER — Other Ambulatory Visit: Payer: Self-pay | Admitting: Family Medicine

## 2011-07-09 DIAGNOSIS — I6529 Occlusion and stenosis of unspecified carotid artery: Secondary | ICD-10-CM | POA: Diagnosis not present

## 2011-07-31 DIAGNOSIS — I4891 Unspecified atrial fibrillation: Secondary | ICD-10-CM | POA: Diagnosis not present

## 2011-07-31 DIAGNOSIS — I2510 Atherosclerotic heart disease of native coronary artery without angina pectoris: Secondary | ICD-10-CM | POA: Diagnosis not present

## 2011-07-31 DIAGNOSIS — I10 Essential (primary) hypertension: Secondary | ICD-10-CM | POA: Diagnosis not present

## 2011-08-05 DIAGNOSIS — B351 Tinea unguium: Secondary | ICD-10-CM | POA: Diagnosis not present

## 2011-08-05 DIAGNOSIS — M79609 Pain in unspecified limb: Secondary | ICD-10-CM | POA: Diagnosis not present

## 2011-08-05 DIAGNOSIS — Q828 Other specified congenital malformations of skin: Secondary | ICD-10-CM | POA: Diagnosis not present

## 2011-10-09 ENCOUNTER — Ambulatory Visit (INDEPENDENT_AMBULATORY_CARE_PROVIDER_SITE_OTHER): Payer: Medicare Other | Admitting: Family Medicine

## 2011-10-09 ENCOUNTER — Encounter: Payer: Self-pay | Admitting: Family Medicine

## 2011-10-09 VITALS — BP 156/75 | HR 93 | Ht 60.5 in | Wt 128.5 lb

## 2011-10-09 DIAGNOSIS — M949 Disorder of cartilage, unspecified: Secondary | ICD-10-CM

## 2011-10-09 DIAGNOSIS — R269 Unspecified abnormalities of gait and mobility: Secondary | ICD-10-CM | POA: Diagnosis not present

## 2011-10-09 DIAGNOSIS — F068 Other specified mental disorders due to known physiological condition: Secondary | ICD-10-CM | POA: Diagnosis not present

## 2011-10-09 DIAGNOSIS — I10 Essential (primary) hypertension: Secondary | ICD-10-CM

## 2011-10-09 DIAGNOSIS — M899 Disorder of bone, unspecified: Secondary | ICD-10-CM

## 2011-10-09 DIAGNOSIS — R1013 Epigastric pain: Secondary | ICD-10-CM | POA: Insufficient documentation

## 2011-10-09 DIAGNOSIS — I5032 Chronic diastolic (congestive) heart failure: Secondary | ICD-10-CM

## 2011-10-09 DIAGNOSIS — Z23 Encounter for immunization: Secondary | ICD-10-CM | POA: Diagnosis not present

## 2011-10-09 DIAGNOSIS — R2689 Other abnormalities of gait and mobility: Secondary | ICD-10-CM

## 2011-10-09 LAB — COMPREHENSIVE METABOLIC PANEL
ALT: 12 U/L (ref 0–35)
Albumin: 4.3 g/dL (ref 3.5–5.2)
BUN: 13 mg/dL (ref 6–23)
Sodium: 139 mEq/L (ref 135–145)
Total Protein: 7.3 g/dL (ref 6.0–8.3)

## 2011-10-09 LAB — CBC
HCT: 34.8 % — ABNORMAL LOW (ref 36.0–46.0)
Hemoglobin: 11.9 g/dL — ABNORMAL LOW (ref 12.0–15.0)
MCH: 30.2 pg (ref 26.0–34.0)
MCHC: 34.2 g/dL (ref 30.0–36.0)
Platelets: 186 10*3/uL (ref 150–400)
RBC: 3.94 MIL/uL (ref 3.87–5.11)

## 2011-10-09 MED ORDER — PANTOPRAZOLE SODIUM 40 MG PO TBEC: 40.0000 mg | DELAYED_RELEASE_TABLET | Freq: Every day | ORAL | Status: DC

## 2011-10-09 NOTE — Assessment & Plan Note (Signed)
Moderate, likely vascular etiology vs alzheimer's. Living situation is stable currently. Discussed option of cholinergic medications, do not feel strongly about starting an agent today in absence of decompensation or family desire. Consider aricept if behavioral issues arise.

## 2011-10-09 NOTE — Assessment & Plan Note (Signed)
Asymptomatic. Continue imdur, BB, lasix, statin. Check labs.

## 2011-10-09 NOTE — Assessment & Plan Note (Signed)
Balance is steady. No concerns identified today. Advised she may benefit from assistive cane if worsens. Encouraged continuing home and senior program exercises.

## 2011-10-09 NOTE — Assessment & Plan Note (Signed)
Intermittent. No red flags on exam today or by history. Treat as presumed GERD/gastritis. Start daily PPI. Follow up in 1-2 weeks if not resolved or sooner if worsens. Discussed dietary modification-no caffeine, spicy foods.

## 2011-10-09 NOTE — Patient Instructions (Addendum)
Nice to meet you. Your stomach pain may be from acid reflux. Please start taking protonix daily-sent to your pharmacy. Will call or send letter about lab work. Keep up good work with exercises and staying active. Please make another appointment if you still have stomach pain in next 2 weeks. Avoid drinking caffeine or eating spicy foods. Make appointment with Dr. Claiborne Billings in next 2-3 months for check up.

## 2011-10-09 NOTE — Assessment & Plan Note (Signed)
At goal today for age. Compliant with norvasc, HCTZ, metoprolol. Due for labs today.

## 2011-10-09 NOTE — Progress Notes (Signed)
  Subjective:    Patient ID: Bethany Cardenas, female    DOB: 09/29/1922, 78 y.o.   MRN: [id number]  HPI  Geriatric assessment.  1. Dementia. She lives independently with her 33 yo sister who does many IADLS including cooking. The patient's niece Christin Bach also checks on them daily and a grand-daughter puts meds in pill box.  Endorses some memory problems and speech-word finding difficulty since a stroke 10 years ago. She has regained some speech function. Has a very mild left side weakness sometimes. Ambulates without assistive device. Her home has only one step at front door, denies problems navigating. Her last fall was several years ago closer to time of her stroke. Does have urinary urge incontinence and wears diapers sometimes. Denies hearing, vision decline or depression recently.   2. Epigastric abdominal pain. Has complained to family about this intermittently past 2 weeks. Not present today. Has good appetite, though was decreased transiently last week. No significant weight loss. Denies any bowel changes, dysuria, emesis, chest pain, palpitations, dyspnea, excessive caffeine intake or NSAID use.   Review of Systems See HPI otherwise negative.  reports that she has quit smoking. She does not have any smokeless tobacco history on file.     Objective:   Physical Exam  Vitals reviewed. Constitutional: She appears well-developed and well-nourished. No distress.  HENT:  Head: Normocephalic and atraumatic.  Mouth/Throat: Oropharynx is clear and moist.  Eyes: EOM are normal. Pupils are equal, round, and reactive to light.  Cardiovascular: Normal rate, regular rhythm and normal heart sounds.  Exam reveals no gallop.   No murmur heard. Pulmonary/Chest: Effort normal and breath sounds normal. No respiratory distress. She has no wheezes.       Bibasilar rales.  Abdominal: Soft. Bowel sounds are normal. She exhibits no distension. There is no tenderness. There is no rebound and no  guarding.  Musculoskeletal: She exhibits no edema and no tenderness.       Bilateral bunions, overriding second toes.  Gait is narrow based, short strides, decreased arm swing on left. Good turn around.  Requires extra time with getting up.  Neurological: She is alert.  Skin: She is not diaphoretic.       Right foot has corn on metatarsal pad. No erythema or sign of infection.  Psychiatric: She has a normal mood and affect.       MMSE 12/30. Oriented to month, day of week, state only.  Speech is hesitant and mumbles sometimes.   Alert and appropriate.    IADL Independent Needs Assistance Dependent  Cooking x    Housework  x   Manage Medications  x   Manage the telephone x    Shopping for food, clothes, Meds, etc  x   Use transportation  x   Manage Finances   x        Assessment & Plan:

## 2011-10-10 ENCOUNTER — Other Ambulatory Visit: Payer: Self-pay | Admitting: Family Medicine

## 2011-10-10 MED ORDER — ERGOCALCIFEROL 1.25 MG (50000 UT) PO CAPS: 50000.0000 [IU] | ORAL_CAPSULE | ORAL | Status: DC

## 2011-10-14 ENCOUNTER — Telehealth: Payer: Self-pay | Admitting: *Deleted

## 2011-10-14 NOTE — Telephone Encounter (Signed)
Message copied by Farrell Ours on Tue Oct 14, 2011  1:54 PM ------      Message from: Durwin Reges      Created: Fri Oct 10, 2011  4:51 PM      Regarding: low vitamin D       Please notify I sent vitamin d rx 50000 units q week to the pharmacy [facility] to start taking. Needs OV to meet new PCP in 2 months please.

## 2011-10-14 NOTE — Telephone Encounter (Signed)
LVM for patient callback.   

## 2011-10-15 NOTE — Telephone Encounter (Signed)
Called to inform pt that she can p/u her Rx was given the following # to call Claris Che [phone]. Spoke with Claris Che and was told that this med was picked up.Loralee Pacas [place]

## 2011-10-28 DIAGNOSIS — Q828 Other specified congenital malformations of skin: Secondary | ICD-10-CM | POA: Diagnosis not present

## 2011-10-28 DIAGNOSIS — B351 Tinea unguium: Secondary | ICD-10-CM | POA: Diagnosis not present

## 2011-10-28 DIAGNOSIS — M79609 Pain in unspecified limb: Secondary | ICD-10-CM | POA: Diagnosis not present

## 2012-01-03 ENCOUNTER — Other Ambulatory Visit: Payer: Self-pay | Admitting: Family Medicine

## 2012-01-05 ENCOUNTER — Other Ambulatory Visit: Payer: Self-pay | Admitting: Family Medicine

## 2012-01-05 NOTE — Telephone Encounter (Signed)
Will need this patient to to make an appointment in January to establish with me. I have refilled her prescriptions for now. Thanks.

## 2012-01-30 ENCOUNTER — Other Ambulatory Visit: Payer: Self-pay | Admitting: Family Medicine

## 2012-01-30 DIAGNOSIS — M79609 Pain in unspecified limb: Secondary | ICD-10-CM | POA: Diagnosis not present

## 2012-01-30 DIAGNOSIS — B351 Tinea unguium: Secondary | ICD-10-CM | POA: Diagnosis not present

## 2012-01-30 DIAGNOSIS — Q828 Other specified congenital malformations of skin: Secondary | ICD-10-CM | POA: Diagnosis not present

## 2012-02-27 ENCOUNTER — Other Ambulatory Visit: Payer: Self-pay | Admitting: Family Medicine

## 2012-03-15 DIAGNOSIS — H25099 Other age-related incipient cataract, unspecified eye: Secondary | ICD-10-CM | POA: Diagnosis not present

## 2012-05-29 ENCOUNTER — Other Ambulatory Visit: Payer: Self-pay | Admitting: Cardiovascular Disease

## 2012-05-31 ENCOUNTER — Other Ambulatory Visit: Payer: Self-pay | Admitting: *Deleted

## 2012-05-31 MED ORDER — HYDROCHLOROTHIAZIDE 25 MG PO TABS: ORAL_TABLET | ORAL | Status: DC

## 2012-06-30 ENCOUNTER — Other Ambulatory Visit: Payer: Self-pay | Admitting: Family Medicine

## 2012-06-30 ENCOUNTER — Other Ambulatory Visit: Payer: Self-pay | Admitting: *Deleted

## 2012-06-30 MED ORDER — PANTOPRAZOLE SODIUM 40 MG PO TBEC: 40.0000 mg | DELAYED_RELEASE_TABLET | Freq: Every day | ORAL | Status: DC

## 2012-07-01 ENCOUNTER — Other Ambulatory Visit: Payer: Self-pay | Admitting: Family Medicine

## 2012-07-02 ENCOUNTER — Ambulatory Visit: Payer: Medicare Other | Admitting: Family Medicine

## 2012-07-02 ENCOUNTER — Ambulatory Visit (INDEPENDENT_AMBULATORY_CARE_PROVIDER_SITE_OTHER): Payer: Medicare Other | Admitting: Family Medicine

## 2012-07-02 ENCOUNTER — Encounter: Payer: Self-pay | Admitting: Family Medicine

## 2012-07-02 VITALS — BP 159/71 | HR 96 | Temp 97.3°F | Ht 60.5 in | Wt 125.8 lb

## 2012-07-02 DIAGNOSIS — R197 Diarrhea, unspecified: Secondary | ICD-10-CM | POA: Diagnosis not present

## 2012-07-02 DIAGNOSIS — M79605 Pain in left leg: Secondary | ICD-10-CM | POA: Insufficient documentation

## 2012-07-02 DIAGNOSIS — G2581 Restless legs syndrome: Secondary | ICD-10-CM

## 2012-07-02 LAB — CBC WITH DIFFERENTIAL/PLATELET
Eosinophils Absolute: 0.1 10*3/uL (ref 0.0–0.7)
Eosinophils Relative: 3 % (ref 0–5)
HCT: 35.7 % — ABNORMAL LOW (ref 36.0–46.0)
Hemoglobin: 11.7 g/dL — ABNORMAL LOW (ref 12.0–15.0)
Lymphs Abs: 1.9 10*3/uL (ref 0.7–4.0)
MCH: 29.3 pg (ref 26.0–34.0)
MCV: 89.5 fL (ref 78.0–100.0)
Monocytes Absolute: 0.5 10*3/uL (ref 0.1–1.0)
Monocytes Relative: 12 % (ref 3–12)
RBC: 3.99 MIL/uL (ref 3.87–5.11)

## 2012-07-02 LAB — COMPREHENSIVE METABOLIC PANEL
CO2: 25 mEq/L (ref 19–32)
Calcium: 8.8 mg/dL (ref 8.4–10.5)
Chloride: 106 mEq/L (ref 96–112)
Creat: 0.7 mg/dL (ref 0.50–1.10)
Glucose, Bld: 87 mg/dL (ref 70–99)
Total Bilirubin: 0.6 mg/dL (ref 0.3–1.2)

## 2012-07-02 LAB — VITAMIN B12: Vitamin B-12: 529 pg/mL (ref 211–911)

## 2012-07-02 MED ORDER — POLYETHYLENE GLYCOL 3350 17 GM/SCOOP PO POWD: 17.0000 g | Freq: Every day | ORAL | Status: DC

## 2012-07-02 NOTE — Assessment & Plan Note (Signed)
-   patient with restless leg syndrome, encourage first starting with tylenol prior to bed. She takes 500 mg tabs PRN. Instructed patient and family on max dose of daily tylenol. - CBC, CMP, B12 and vitamin D levels ordered today.  - Will follow up with patient in 2 weeks. If pain is not better with tylenol HS, then will consider Requip or levodopa at small dose.

## 2012-07-02 NOTE — Patient Instructions (Addendum)
It was a pleasure meeting you today. I am going to get some basic lab work from Bethany Cardenas and we can go over the results on your next appointment in two weeks. I also would like you to take 500 mg tylenol before you go to bed every night (Do NOT take over 300 mg a day). If this does not make your legs feel better at night we will start a different medication on your next visit. I have also called in Miralax for you to take daily. Try to take about 1/2 cap (or less daily) to resolve your bowel problems. If this does not make your diarrhea go away, then we will check other possible things next visit.   Restless Legs Syndrome Restless legs syndrome is a movement disorder. It may also be called a sensori-motor disorder.  CAUSES  No one knows what specifically causes restless legs syndrome, but it tends to run in families. It is also more common in people with low iron, in pregnancy, in people who need dialysis, and those with nerve damage (neuropathy).Some medications may make restless legs syndrome worse.Those medications include drugs to treat high blood pressure, some heart conditions, nausea, colds, allergies, and depression. SYMPTOMS Symptoms include uncomfortable sensations in the legs. These leg sensations are worse during periods of inactivity or rest. They are also worse while sitting or lying down. Individuals that have the disorder describe sensations in the legs that feel like:  Pulling.  Drawing.  Crawling.  Worming.  Boring.  Tingling.  Pins and needles.  Prickling.  Pain. The sensations are usually accompanied by an overwhelming urge to move the legs. Sudden muscle jerks may also occur. Movement provides temporary relief from the discomfort. In rare cases, the arms may also be affected. Symptoms may interfere with going to sleep (sleep onset insomnia). Restless legs syndrome may also be related to periodic limb movement disorder (PLMD). PLMD is another more common motor  disorder. It also causes interrupted sleep. The symptoms from PLMD usually occur most often when you are awake. TREATMENT  Treatment for restless legs syndrome is symptomatic. This means that the symptoms are treated.   Massage and cold compresses may provide temporary relief.  Walk, stretch, or take a cold or hot bath.  Get regular exercise and a good night's sleep.  Avoid caffeine, alcohol, nicotine, and medications that can make it worse.  Do activities that provide mental stimulation like discussions, needlework, and video games. These may be helpful if you are not able to walk or stretch. Some medications are effective in relieving the symptoms. However, many of these medications have side effects. Ask your caregiver about medications that may help your symptoms. Correcting iron deficiency may improve symptoms for some patients. Document Released: 12/20/2001 Document Revised: 03/24/2011 Document Reviewed: 03/28/2010 [facility] Patient Information 2014 [place], [place].

## 2012-07-02 NOTE — Assessment & Plan Note (Addendum)
-   Possibly constipation causing intermitent diarrhea. Have instructed to use Miralax 1/2 cap until BM regulate. If diarrhea becomes worse to stop Miralax. - F/U: 2 weeks

## 2012-07-02 NOTE — Progress Notes (Signed)
Subjective:     Patient ID: Bethany Cardenas, female   DOB: 1922/09/30, 79 y.o.   MRN: [id number]  HPI Leg pain: She is in accompianed by her sister and niece. She describes the pain as achy bilateral pain that is worse at night, causing her to stay awake and need to move her legs, or get up and walk. She has had this pain intermittently in the past, but the last few days has become worse. She denies fever, erythema, weakness or swelling of her legs. She does not feel this pain with walking or after walking. She reports the pain is only below her knees and it does not affect her thighs or arms.   Diarrhea: Her family reports diarrhea. She does not seem as concerned. Her family reports it is often and patient had diarrhea from Thursday to Sunday. Patient does not complain of abdominal pain, nausea, vomit, dysuria or fever.     Of note patient lives with her sister (elderly), the niece visits daily and manages all medicaitons.   A full review of medications, social history, problem list, immunizations and allergies were performed.  Review of Systems See above HPI    Objective:   Physical Exam BP 159/71  Pulse 96  Temp(Src) 97.3 F (36.3 C) (Oral)  Ht 5' 0.5" (1.537 m)  Wt 125 lb 12.8 oz (57.063 kg)  BMI 24.15 kg/m2 Gen: NAD. Pleasant elderly female. Rather mobile for age. Likely mild dementia. HENT: [place]. AT. PERLA EOMI. Bilateral nares without deformity and patent. Bilateral eyes without sclera icterus or drainage. Mildly HOH.  CV: RRR. No murmur.  Chest: CTAB. No wheeze or rales ABd: Soft. NTND. Could palpate mild stool burden. BS present and normal.  MSK: Gait with wide stance, unable to tandem stance. Normal walk without deviation. Able to maneuver on table slowly. 5/5 muscle strength.  Neuro: Grossly intact. PERLA EOMi. Mildly HOH Ext: Normal ROM. No erythema, trace edema. Negative homans. No tenderness to palpation. +2/4 pulses bilaterally.

## 2012-07-03 ENCOUNTER — Encounter: Payer: Self-pay | Admitting: Family Medicine

## 2012-07-03 DIAGNOSIS — E559 Vitamin D deficiency, unspecified: Secondary | ICD-10-CM | POA: Insufficient documentation

## 2012-07-03 LAB — VITAMIN D 25 HYDROXY (VIT D DEFICIENCY, FRACTURES): Vit D, 25-Hydroxy: 22 ng/mL — ABNORMAL LOW (ref 30–89)

## 2012-07-05 DIAGNOSIS — M79609 Pain in unspecified limb: Secondary | ICD-10-CM | POA: Diagnosis not present

## 2012-07-05 DIAGNOSIS — B351 Tinea unguium: Secondary | ICD-10-CM | POA: Diagnosis not present

## 2012-07-05 DIAGNOSIS — Q828 Other specified congenital malformations of skin: Secondary | ICD-10-CM | POA: Diagnosis not present

## 2012-07-30 ENCOUNTER — Ambulatory Visit: Payer: Medicare Other | Admitting: Family Medicine

## 2012-08-11 ENCOUNTER — Ambulatory Visit: Payer: Medicare Other | Admitting: Family Medicine

## 2012-08-31 ENCOUNTER — Encounter: Payer: Self-pay | Admitting: Family Medicine

## 2012-08-31 ENCOUNTER — Ambulatory Visit (INDEPENDENT_AMBULATORY_CARE_PROVIDER_SITE_OTHER): Payer: Medicare Other | Admitting: Family Medicine

## 2012-08-31 VITALS — BP 145/57 | HR 92 | Temp 98.8°F | Ht 65.0 in | Wt 124.0 lb

## 2012-08-31 DIAGNOSIS — I70219 Atherosclerosis of native arteries of extremities with intermittent claudication, unspecified extremity: Secondary | ICD-10-CM | POA: Diagnosis not present

## 2012-08-31 DIAGNOSIS — M79605 Pain in left leg: Secondary | ICD-10-CM

## 2012-08-31 DIAGNOSIS — M79609 Pain in unspecified limb: Secondary | ICD-10-CM

## 2012-08-31 DIAGNOSIS — D649 Anemia, unspecified: Secondary | ICD-10-CM

## 2012-08-31 NOTE — Patient Instructions (Addendum)
It was a pleasure seeing you today. -  I want you to drink 1 L of water a day.  - I am going to get Iron studies today. I will call you with results - I am also ordering an ABI of your left leg and compression stockings  Please make an appointment with your cardiologist ASAP for follow up to your heart condition.

## 2012-09-01 ENCOUNTER — Encounter: Payer: Self-pay | Admitting: Family Medicine

## 2012-09-01 LAB — IBC PANEL
%SAT: 30 % (ref 20–55)
TIBC: 275 ug/dL (ref 250–470)

## 2012-09-01 NOTE — Assessment & Plan Note (Addendum)
-   Prior appt symptoms seemed more consistent with restless leg. Now seems more cramp-like in nature. Unilateral (left) and anterior leg. I do not believe she is a good candidate for muscle relaxer.  - Will order compression stockings. Likely Venous insufficiency causing ache/cramps. No signs or symptoms of DVT.  - Electrolytes have been normal on last visit, as well B12 and Vitamin D.  - Will complete Iron studies today and ABI. Raj Janus studies also normal today) - encouraged her drink a liter of fluid a day (at least) - F/u after abi and cardiology appt.

## 2012-09-01 NOTE — Progress Notes (Signed)
Subjective:     Patient ID: Bethany Cardenas, female   DOB: 07-19-1922, 79 y.o.   MRN: [id number]  HPI Left Leg pain: She reports her left leg still hurts her. She now points to her anterior LE by her shin. She reports it is more of a cramp like in nature and occurs day or night. She reports it helps to walk it out. She does not report pain with walking distances in her legs. She does not take tylenol every night, as we discussed last appointment. She has recently lost her brother and was unable to continue follow-up at scheduled appointment for me or her cardiologists. She has been trying to drink more water since her last visit, but does not feel she is drinking enough. We reviewed her last labs that were basically normal CBC, B12 and vitamin D. She has seen no improvement since last visit. She has never wore compression stockings that she can recall. She denies current pain.   Patient's past medical, social, and family history were reviewed and updated as appropriate. Review of Systems Negative, with the exception of above mentioned in HPI  Objective:   Physical Exam BP 145/57  Pulse 92  Temp(Src) 98.8 F (37.1 C) (Oral)  Ht 5\' 5"  (1.651 m)  Wt 124 lb (56.246 kg)  BMI 20.63 kg/m2 Gen: Pleasant elderly lady. Ambulates well with caution.  CV: RRR Chest: CTAB Ext: No edema or erythema. Not TTP currently. Left leg is mildly larger than right (has been the location of vein graft prior). Pulses equal bilateral LE

## 2012-09-06 ENCOUNTER — Other Ambulatory Visit: Payer: Self-pay | Admitting: Cardiovascular Disease

## 2012-09-22 ENCOUNTER — Other Ambulatory Visit: Payer: Self-pay | Admitting: Family Medicine

## 2012-09-29 ENCOUNTER — Telehealth: Payer: Self-pay | Admitting: Family Medicine

## 2012-09-29 NOTE — Telephone Encounter (Signed)
Pt called and would like to get a refill on amlodipine sent to pharmacy. JW

## 2012-09-30 ENCOUNTER — Other Ambulatory Visit: Payer: Self-pay | Admitting: Family Medicine

## 2012-09-30 DIAGNOSIS — I10 Essential (primary) hypertension: Secondary | ICD-10-CM

## 2012-09-30 MED ORDER — AMLODIPINE BESYLATE 10 MG PO TABS: 10.0000 mg | ORAL_TABLET | Freq: Every day | ORAL | Status: DC

## 2012-09-30 NOTE — Telephone Encounter (Signed)
Will forward to Dr Claiborne Billings

## 2012-10-19 ENCOUNTER — Other Ambulatory Visit: Payer: Self-pay | Admitting: Cardiovascular Disease

## 2012-10-20 NOTE — Telephone Encounter (Signed)
Rx was sent to pharmacy electronically. 

## 2012-10-26 ENCOUNTER — Telehealth: Payer: Self-pay | Admitting: Family Medicine

## 2012-10-26 NOTE — Telephone Encounter (Signed)
Patient would like a referral to [facility]. Please call her niece, Claris Che, at [phone]

## 2012-10-28 NOTE — Telephone Encounter (Signed)
Will need more information in order to refer this patient to podiatry. Please call and obtain more information from niece, Claris Che, on why she would like a referral to podiatry. Her phone number per prior message is [phone]. Thanks

## 2012-10-31 ENCOUNTER — Other Ambulatory Visit: Payer: Self-pay | Admitting: Cardiovascular Disease

## 2012-11-01 ENCOUNTER — Encounter: Payer: Self-pay | Admitting: *Deleted

## 2012-11-02 NOTE — Telephone Encounter (Signed)
Rx was sent to pharmacy electronically. 

## 2012-11-20 ENCOUNTER — Other Ambulatory Visit: Payer: Self-pay | Admitting: Family Medicine

## 2012-11-23 ENCOUNTER — Ambulatory Visit (INDEPENDENT_AMBULATORY_CARE_PROVIDER_SITE_OTHER): Payer: Medicare Other | Admitting: *Deleted

## 2012-11-23 DIAGNOSIS — Z23 Encounter for immunization: Secondary | ICD-10-CM

## 2012-11-30 ENCOUNTER — Other Ambulatory Visit: Payer: Self-pay | Admitting: Family Medicine

## 2012-11-30 ENCOUNTER — Other Ambulatory Visit: Payer: Self-pay | Admitting: Cardiovascular Disease

## 2012-12-01 NOTE — Telephone Encounter (Signed)
Rx was sent to pharmacy electronically. 

## 2012-12-03 ENCOUNTER — Encounter: Payer: Self-pay | Admitting: Family Medicine

## 2012-12-03 ENCOUNTER — Ambulatory Visit (INDEPENDENT_AMBULATORY_CARE_PROVIDER_SITE_OTHER): Payer: Medicare Other | Admitting: Family Medicine

## 2012-12-03 VITALS — BP 144/66 | HR 96 | Temp 97.6°F | Ht 65.0 in | Wt 125.1 lb

## 2012-12-03 DIAGNOSIS — H6120 Impacted cerumen, unspecified ear: Secondary | ICD-10-CM

## 2012-12-03 DIAGNOSIS — H6123 Impacted cerumen, bilateral: Secondary | ICD-10-CM

## 2012-12-03 NOTE — Patient Instructions (Addendum)
Thank you for coming in today!  Your ear complaints are related to ear wax accumulation, which was treated today. If you experience continuing symptoms, return to the clinic. You should not place anything into your ears, including paper towels, tissue, or Qtips.   Take care and seek immediate care if you develop persistent dizziness, nausea or vomiting, ear drainage, or severe ear pain.  Please feel free to call with any questions or concerns at any time, at [phone]. - Dr. Jarvis Newcomer  Follow up in about 2 weeks with Dr. Claiborne Billings to follow up on your right ear, which still has some wax in it.

## 2012-12-03 NOTE — Progress Notes (Signed)
Patient ID: Bethany Cardenas, female   DOB: 27-Oct-1922, 79 y.o.   MRN: [id number] Subjective:  HPI:   Bethany Cardenas is a 79 y.o. female with h/o CAD, PVD, HTN, HLD, and dementia here with ear complaints.   She has had decreased hearing on both sides and c/o feeling fullness bilaterally. She describes a "wooshing" occasionally but no pain, no drainage, no recent illness or sick contacts. No fever, sore throat, cough. No history of this. No recent swimming.   She is accompanied by her sister and niece, and history is supplemented by them. Per them, this has been a progressive issue over weeks, worst in the past week.   Review of Systems:  Per HPI. All other systems reviewed and are negative.    Objective:  Physical Exam: BP 144/66  Pulse 96  Temp(Src) 97.6 F (36.4 C) (Oral)  Ht 5\' 5"  (1.651 m)  Wt 125 lb 1.6 oz (56.745 kg)  BMI 20.82 kg/m2  Gen: 79 y.o. female in NAD HEENT: MMM, oropharynx clear without erythema or enlarged tonsils.  Ear canals obstructed with dark brown cerumen bilaterally. No pain on palpation of pinna, no drainage. TMs not visualized.  CV: RRR, no MRG Resp: Non-labored, CTAB, no wheezes noted Abd: Soft, NTND, BS present, no guarding or organomegaly MSK: No edema noted, full ROM Neuro: Alert and oriented, speech normal, hearing grossly impaired, othe CNs grossly intact.     Assessment:     Bethany Cardenas is a 79 y.o. female with cerumen impaction.     Plan:     Cerumen is removed by syringing and manual debridement. Post procedure examination shows cerumen was completely removed from L ear, though R ear still has dark brown impaction with some lighter brown more friable components. Efforts were stopped due to patient reporting some pain.  Hearing much improved bilaterally. Instructions for home care to prevent wax buildup are given. Follow up in 2 weeks to reassess R ear.

## 2012-12-12 ENCOUNTER — Emergency Department (HOSPITAL_COMMUNITY)
Admission: EM | Admit: 2012-12-12 | Discharge: 2012-12-12 | Disposition: A | Discharge status: Home / Self Care | Payer: Medicare Other | Attending: Emergency Medicine | Admitting: Emergency Medicine

## 2012-12-12 ENCOUNTER — Other Ambulatory Visit: Payer: Self-pay | Admitting: Cardiovascular Disease

## 2012-12-12 ENCOUNTER — Emergency Department (HOSPITAL_COMMUNITY): Payer: Medicare Other

## 2012-12-12 ENCOUNTER — Encounter (HOSPITAL_COMMUNITY): Payer: Self-pay | Admitting: Emergency Medicine

## 2012-12-12 DIAGNOSIS — Z8673 Personal history of transient ischemic attack (TIA), and cerebral infarction without residual deficits: Secondary | ICD-10-CM | POA: Insufficient documentation

## 2012-12-12 DIAGNOSIS — Z79899 Other long term (current) drug therapy: Secondary | ICD-10-CM | POA: Diagnosis not present

## 2012-12-12 DIAGNOSIS — I4891 Unspecified atrial fibrillation: Secondary | ICD-10-CM | POA: Insufficient documentation

## 2012-12-12 DIAGNOSIS — R55 Syncope and collapse: Secondary | ICD-10-CM | POA: Diagnosis not present

## 2012-12-12 DIAGNOSIS — Z87891 Personal history of nicotine dependence: Secondary | ICD-10-CM | POA: Diagnosis not present

## 2012-12-12 DIAGNOSIS — R42 Dizziness and giddiness: Secondary | ICD-10-CM | POA: Diagnosis not present

## 2012-12-12 DIAGNOSIS — R404 Transient alteration of awareness: Secondary | ICD-10-CM | POA: Diagnosis not present

## 2012-12-12 DIAGNOSIS — Z7982 Long term (current) use of aspirin: Secondary | ICD-10-CM | POA: Insufficient documentation

## 2012-12-12 DIAGNOSIS — R5381 Other malaise: Secondary | ICD-10-CM | POA: Diagnosis not present

## 2012-12-12 HISTORY — DX: Cerebral infarction, unspecified: I63.9

## 2012-12-12 HISTORY — DX: Unspecified atrial fibrillation: I48.91

## 2012-12-12 LAB — URINALYSIS, ROUTINE W REFLEX MICROSCOPIC
Bilirubin Urine: NEGATIVE
Glucose, UA: NEGATIVE mg/dL
Hgb urine dipstick: NEGATIVE
Nitrite: NEGATIVE
Protein, ur: NEGATIVE mg/dL
Urobilinogen, UA: 1 mg/dL (ref 0.0–1.0)
pH: 5.5 (ref 5.0–8.0)

## 2012-12-12 LAB — CBC WITH DIFFERENTIAL/PLATELET
Basophils Absolute: 0 10*3/uL (ref 0.0–0.1)
Basophils Relative: 0 % (ref 0–1)
Eosinophils Absolute: 0 10*3/uL (ref 0.0–0.7)
Eosinophils Relative: 1 % (ref 0–5)
HCT: 38.6 % (ref 36.0–46.0)
Lymphs Abs: 1.6 10*3/uL (ref 0.7–4.0)
MCH: 31.5 pg (ref 26.0–34.0)
MCHC: 34.7 g/dL (ref 30.0–36.0)
MCV: 90.6 fL (ref 78.0–100.0)
Monocytes Absolute: 0.6 10*3/uL (ref 0.1–1.0)
Neutrophils Relative %: 64 % (ref 43–77)
Platelets: 162 10*3/uL (ref 150–400)
RDW: 13.1 % (ref 11.5–15.5)
WBC: 6.2 10*3/uL (ref 4.0–10.5)

## 2012-12-12 LAB — URINE MICROSCOPIC-ADD ON

## 2012-12-12 LAB — BASIC METABOLIC PANEL
BUN: 16 mg/dL (ref 6–23)
CO2: 25 mEq/L (ref 19–32)
Calcium: 9.8 mg/dL (ref 8.4–10.5)
GFR calc non Af Amer: 64 mL/min — ABNORMAL LOW (ref >90)
Glucose, Bld: 78 mg/dL (ref 70–99)

## 2012-12-12 NOTE — ED Provider Notes (Signed)
CSN: [id number]     Arrival date & time 12/12/12  1218 History   First MD Initiated Contact with Patient 12/12/12 1253     Chief Complaint  Patient presents with  . Hot Flashes    Patient is a 79 y.o. female presenting with weakness. The history is provided by the patient and a relative.  Weakness This is a new problem. Episode onset: earlier this morning. The problem has been rapidly improving. Pertinent negatives include no chest pain, no abdominal pain, no headaches and no shortness of breath. Nothing aggravates the symptoms. Nothing relieves the symptoms.  pt presents for "feeling hot" She was at church and felt "dizzy" but no LOC reported No cp/sob No HA No focal weakness No vomiting/diarrhea and no recent dark stools No abd pain She now feels improved  No fever reported  She had otherwise been at her baseline No new meds    Past Medical History  Diagnosis Date  . TOBACCO DEPENDENCE 03/12/2006    Qualifier: Diagnosis of  By: Abundio Miu    . CVA 03/12/2006    Qualifier: Diagnosis of  By: Abundio Miu    . Stroke   . Atrial fibrillation    History reviewed. No pertinent past surgical history. History reviewed. No pertinent family history. History  Substance Use Topics  . Smoking status: Former Games developer  . Smokeless tobacco: Never Used  . Alcohol Use: No   OB History   Grav Para Term Preterm Abortions TAB SAB Ect Mult Living                 Review of Systems  Respiratory: Negative for shortness of breath.   Cardiovascular: Negative for chest pain.  Gastrointestinal: Negative for abdominal pain and blood in stool.  Neurological: Positive for weakness. Negative for headaches.  All other systems reviewed and are negative.    Allergies  Review of patient's allergies indicates no known allergies.  Home Medications   Current Outpatient Rx  Name  Route  Sig  Dispense  Refill  . EXPIRED: acetaminophen (TYLENOL 8 HOUR) 650 MG CR tablet   Oral   Take  1 tablet (650 mg total) by mouth every 12 (twelve) hours.   60 tablet   6   . acetaminophen (TYLENOL) 650 MG CR tablet   Oral   Take 650 mg by mouth every 8 (eight) hours as needed.           Marland Kitchen amLODipine (NORVASC) 10 MG tablet   Oral   Take 1 tablet (10 mg total) by mouth daily.   30 tablet   6   . aspirin (ADULT ASPIRIN EC LOW STRENGTH) 81 MG EC tablet   Oral   Take 81 mg by mouth daily.           . calcium-vitamin D (OSCAL 500/200 D-3) 500 MG tablet   Oral   Take 1 tablet by mouth 2 (two) times daily.           . COMBIVENT 18-103 MCG/ACT inhaler      *SHAKE WELL* INHALE 1 PUFF BY MOUTH 4 TIMES A DAY   14.7 g   1   . furosemide (LASIX) 40 MG tablet      TAKE 1 TABLET (40 MG TOTAL) BY MOUTH DAILY.   30 tablet   6   . hydrochlorothiazide (HYDRODIURIL) 25 MG tablet      TAKE 1 TABLET EVERY DAY   30 tablet   2   . isosorbide  mononitrate (IMDUR) 30 MG 24 hr tablet      TAKE 1 TABLET BY MOUTH EVERY DAY   15 tablet   0     PATIENT NEEDS AN APPOINTMENT FOR FUTURE REFILLS.   Marland Kitchen metoprolol (LOPRESSOR) 50 MG tablet      TAKE 1 TABLET TWICE A DAY   15 tablet   0     PATIENT NEEDS AN APPOINTMENT FOR FUTURE REFILLS.   Marland Kitchen pantoprazole (PROTONIX) 20 MG tablet      TAKE 2 TABLETS BY MOUTH EVERY DAY   60 tablet   0   . pantoprazole (PROTONIX) 20 MG tablet      TAKE 2 TABLETS BY MOUTH EVERY DAY   60 tablet   2   . pantoprazole (PROTONIX) 40 MG tablet   Oral   Take 1 tablet (40 mg total) by mouth daily.   30 tablet   6   . polyethylene glycol powder (GLYCOLAX/MIRALAX) powder   Oral   Take 17 g by mouth daily.   3350 g   1   . EXPIRED: potassium chloride (K-DUR) 10 MEQ tablet   Oral   Take 2 tablets (20 mEq total) by mouth daily.   30 tablet   6   . potassium chloride SA (K-DUR,KLOR-CON) 20 MEQ tablet   Oral   Take 20 mEq by mouth 2 (two) times daily.         . simvastatin (ZOCOR) 40 MG tablet      TAKE 1 TABLET BY MOUTH EVERY DAY   30  tablet   0     Patient needs to make a appointment for future ref ...   . Vitamin D, Ergocalciferol, (DRISDOL) 50000 UNITS CAPS      TAKE 1 CAPSULE (50,000 UNITS TOTAL) BY MOUTH ONCE A WEEK.   4 capsule   2    BP 138/73  Pulse 84  Temp(Src) 97.9 F (36.6 C) (Oral)  Resp 15  SpO2 99% Physical Exam CONSTITUTIONAL: Well developed/well nourished HEAD: Normocephalic/atraumatic EYES: EOMI/PERRL ENMT: Mucous membranes moist NECK: supple no meningeal signs SPINE:entire spine nontender CV: irregular no murmurs/rubs/gallops noted LUNGS: coarse BS noted bilaterally ABDOMEN: soft, nontender, no rebound or guarding GU:no cva tenderness NEURO: Pt is awake/alert, moves all extremitiesx4, no arm/leg drift EXTREMITIES: pulses normal, full ROM SKIN: warm, color normal PSYCH: no abnormalities of mood noted  ED Course  Procedures (including critical care time)  1:34 PM Pt felt "hot" at church now back to baseline No syncope No new weakness and no slurred speech reported She has h/o afib and is apparently not on anticoagulants Will check labs/imaging and reassess  3:26 PM Pt feels well, no new complaints Labs reassuring Urine sent for culture (denied dysuria/pain) She has PCP in two days already scheduled Discussed strict return precautions  Labs Review Labs Reviewed  BASIC METABOLIC PANEL  URINALYSIS, ROUTINE W REFLEX MICROSCOPIC  CBC WITH DIFFERENTIAL   Imaging Review Dg Chest Portable 1 View  12/12/2012   CLINICAL DATA:  Weakness  EXAM: PORTABLE CHEST - 1 VIEW  COMPARISON:  01/28/2006  FINDINGS: Cardiac leads project over the chest. Heart size appears enlarged for portable technique, and appears more prominent in size than it did on a chest radiograph of 2008. Thoracic aortic atherosclerotic calcification noted. Pulmonary vascularity is normal. No focal airspace opacities, visible effusions, or pulmonary edema. Trachea is midline. Cardiac leads project over the chest. No  acute osseous abnormality seen.  IMPRESSION: Probable cardiomegaly. Heart size appears larger  than it did on the chest radiograph of 2008. The lungs are clear.   Electronically Signed   By: Britta Mccreedy M.D.   On: 12/12/2012 13:31    EKG Interpretation    Date/Time:  Sunday December 12 2012 13:24:00 EST Ventricular Rate:  85 PR Interval:    QRS Duration: 84 QT Interval:  381 QTC Calculation: 453 R Axis:   -84 Text Interpretation:  Atrial fibrillation Ventricular premature complex Left anterior fascicular block Probable anterior infarct, old Borderline repolarization abnormality Artifact in lead(s) I III aVR aVL aVF Confirmed by Bebe Shaggy  MD, Larry Knipp ([id number]) on 12/12/2012 1:34:11 PM            MDM  No diagnosis found. Nursing notes including past medical history and social history reviewed and considered in documentation Labs/vital reviewed and considered xrays reviewed and considered     Joya Gaskins, MD 12/12/12 1527

## 2012-12-12 NOTE — ED Notes (Signed)
Pt from church via [place] with c/o of getting "hot."  Denies LOC.  Hx of stroke.  Pt in NAD, A&O.

## 2012-12-13 NOTE — Telephone Encounter (Signed)
Rx was sent to pharmacy electronically. 

## 2012-12-14 ENCOUNTER — Encounter: Payer: Self-pay | Admitting: Family Medicine

## 2012-12-14 ENCOUNTER — Ambulatory Visit (INDEPENDENT_AMBULATORY_CARE_PROVIDER_SITE_OTHER): Payer: Medicare Other | Admitting: Family Medicine

## 2012-12-14 VITALS — BP 152/75 | HR 98 | Temp 98.1°F | Ht 65.0 in | Wt 122.0 lb

## 2012-12-14 DIAGNOSIS — H6120 Impacted cerumen, unspecified ear: Secondary | ICD-10-CM | POA: Insufficient documentation

## 2012-12-14 DIAGNOSIS — N390 Urinary tract infection, site not specified: Secondary | ICD-10-CM | POA: Diagnosis not present

## 2012-12-14 DIAGNOSIS — H6123 Impacted cerumen, bilateral: Secondary | ICD-10-CM

## 2012-12-14 MED ORDER — CEPHALEXIN 500 MG PO CAPS: 500.0000 mg | ORAL_CAPSULE | Freq: Three times a day (TID) | ORAL | Status: DC

## 2012-12-14 NOTE — Progress Notes (Signed)
   Subjective:    Patient ID: Bethany Cardenas, female    DOB: 18-Jul-1922, 79 y.o.   MRN: [id number]  HPI  UTI: Patient had urine culture obtained in the ED 2 days ago. She presented to the ED for dizziness that she says is from being overheated during church. She's had a two-week history of lack of appetite and some nausea. Original dizziness 2 weeks ago as well which was thought to be from her ear impaction. Urine culture is growing gram-negative rod greater than 100,000 colonies. Patient denies dysuria, frequency or fever.  Ears: Patient underwent cerumen impaction removal 2 weeks ago. She reports she has had no complications with the procedure and is hearing much better.    Review of Systems Negative, with the exception of above mentioned in HPI  Objective:   Physical Exam BP 152/75  Pulse 98  Temp(Src) 98.1 F (36.7 C) (Oral)  Ht 5\' 5"  (1.651 m)  Wt 122 lb (55.339 kg)  BMI 20.30 kg/m2 Gen: NAD. Pleasant. HEENT: AT. [name]. Bilateral TM visualized and normal in appearance on the left; right ear still with hard impaction. Bilateral eyes without injections or icterus. MMM.  CV: RRR Chest: CTAB, no wheeze or crackles Abd: Soft. NTND. BS present.

## 2012-12-14 NOTE — Assessment & Plan Note (Signed)
Patient doing well. Right ear still impacted. Recommended over-the-counter preparation to help loosen the earwax slowly over time.

## 2012-12-14 NOTE — Assessment & Plan Note (Signed)
Patient has gram-negative rods greater than 100,000 colonies per culture 2 days ago in the emergency room. Start patient on Keflex for 7 days. We will follow cultures.

## 2012-12-14 NOTE — Patient Instructions (Addendum)
Urinary Tract Infection Urinary tract infections (UTIs) can develop anywhere along your urinary tract. Your urinary tract is your body's drainage system for removing wastes and extra water. Your urinary tract includes two kidneys, two ureters, a bladder, and a urethra. Your kidneys are a pair of bean-shaped organs. Each kidney is about the size of your fist. They are located below your ribs, one on each side of your spine. CAUSES Infections are caused by microbes, which are microscopic organisms, including fungi, viruses, and bacteria. These organisms are so small that they can only be seen through a microscope. Bacteria are the microbes that most commonly cause UTIs. SYMPTOMS  Symptoms of UTIs may vary by age and gender of the patient and by the location of the infection. Symptoms in young women typically include a frequent and intense urge to urinate and a painful, burning feeling in the bladder or urethra during urination. Older women and men are more likely to be tired, shaky, and weak and have muscle aches and abdominal pain. A fever may mean the infection is in your kidneys. Other symptoms of a kidney infection include pain in your back or sides below the ribs, nausea, and vomiting. DIAGNOSIS To diagnose a UTI, your caregiver will ask you about your symptoms. Your caregiver also will ask to provide a urine sample. The urine sample will be tested for bacteria and white blood cells. White blood cells are made by your body to help fight infection. TREATMENT  Typically, UTIs can be treated with medication. Because most UTIs are caused by a bacterial infection, they usually can be treated with the use of antibiotics. The choice of antibiotic and length of treatment depend on your symptoms and the type of bacteria causing your infection. HOME CARE INSTRUCTIONS  If you were prescribed antibiotics, take them exactly as your caregiver instructs you. Finish the medication even if you feel better after you  have only taken some of the medication.  Drink enough water and fluids to keep your urine clear or pale yellow.  Avoid caffeine, tea, and carbonated beverages. They tend to irritate your bladder.  Empty your bladder often. Avoid holding urine for long periods of time.  Empty your bladder before and after sexual intercourse.  After a bowel movement, women should cleanse from front to back. Use each tissue only once. SEEK MEDICAL CARE IF:   You have back pain.  You develop a fever.  Your symptoms do not begin to resolve within 3 days. SEEK IMMEDIATE MEDICAL CARE IF:   You have severe back pain or lower abdominal pain.  You develop chills.  You have nausea or vomiting.  You have continued burning or discomfort with urination. MAKE SURE YOU:   Understand these instructions.  Will watch your condition.  Will get help right away if you are not doing well or get worse. Document Released: 10/09/2004 Document Revised: 07/01/2011 Document Reviewed: 02/07/2011 [facility] Patient Information 2014 [place], [place].  Please take the antibiotic 3 times a day for one week. She had any complications please contact the office and make an appointment. Your ears look very good today. That right ear still has some ear wax. I would recommend attempting an over-the-counter preparation. Ask your pharmacist about this.

## 2012-12-16 LAB — URINE CULTURE

## 2012-12-16 NOTE — ED Notes (Signed)
+   Urine Patient treated per protocol MD. 

## 2012-12-26 ENCOUNTER — Other Ambulatory Visit: Payer: Self-pay | Admitting: Cardiovascular Disease

## 2012-12-27 NOTE — Telephone Encounter (Signed)
Rx was sent to pharmacy electronically. 

## 2013-01-05 ENCOUNTER — Ambulatory Visit (INDEPENDENT_AMBULATORY_CARE_PROVIDER_SITE_OTHER): Payer: Medicare Other | Admitting: Podiatry

## 2013-01-05 ENCOUNTER — Encounter: Payer: Self-pay | Admitting: Podiatry

## 2013-01-05 VITALS — BP 131/58 | HR 87 | Resp 16

## 2013-01-05 DIAGNOSIS — B351 Tinea unguium: Secondary | ICD-10-CM

## 2013-01-05 DIAGNOSIS — Q828 Other specified congenital malformations of skin: Secondary | ICD-10-CM | POA: Diagnosis not present

## 2013-01-05 DIAGNOSIS — M79609 Pain in unspecified limb: Secondary | ICD-10-CM

## 2013-01-05 NOTE — Progress Notes (Signed)
   Subjective:    Patient ID: Bethany Cardenas, female    DOB: 01/03/1923, 79 y.o.   MRN: [id number]  HPI I just need my toenails trimmed up and also my calluses need a trim  Patient presents for ongoing debridement of painful mycotic toenails and keratoses at approximately three-month intervals or she's been a patient in this practice since 2008.  Review of Systems     Objective:   Physical Exam  Orientated x107 79 year old black female   Dermatological: Hypertrophic, brittle, elongated, discolored toenails x10. Large nucleated keratoses plantar right.        Assessment & Plan:   Assessment: Symptomatic onychomycosis x10 Porokeratoses x1  Plan: Nails x10 debrided back without a bleeding. Porokeratoses x1 debrided back without any bleeding. Reappoint at three-month intervals.

## 2013-01-09 ENCOUNTER — Emergency Department (HOSPITAL_COMMUNITY): Payer: Medicare Other

## 2013-01-09 ENCOUNTER — Encounter (HOSPITAL_COMMUNITY): Payer: Self-pay | Admitting: Emergency Medicine

## 2013-01-09 ENCOUNTER — Emergency Department (HOSPITAL_COMMUNITY)
Admission: EM | Admit: 2013-01-09 | Discharge: 2013-01-09 | Disposition: A | Discharge status: Home / Self Care | Payer: Medicare Other | Source: Home / Self Care | Attending: Emergency Medicine | Admitting: Emergency Medicine

## 2013-01-09 DIAGNOSIS — I658 Occlusion and stenosis of other precerebral arteries: Secondary | ICD-10-CM | POA: Diagnosis not present

## 2013-01-09 DIAGNOSIS — I6350 Cerebral infarction due to unspecified occlusion or stenosis of unspecified cerebral artery: Secondary | ICD-10-CM | POA: Diagnosis not present

## 2013-01-09 DIAGNOSIS — R4789 Other speech disturbances: Secondary | ICD-10-CM | POA: Diagnosis not present

## 2013-01-09 DIAGNOSIS — R918 Other nonspecific abnormal finding of lung field: Secondary | ICD-10-CM | POA: Diagnosis not present

## 2013-01-09 DIAGNOSIS — I651 Occlusion and stenosis of basilar artery: Secondary | ICD-10-CM | POA: Diagnosis not present

## 2013-01-09 DIAGNOSIS — E785 Hyperlipidemia, unspecified: Secondary | ICD-10-CM | POA: Diagnosis present

## 2013-01-09 DIAGNOSIS — K219 Gastro-esophageal reflux disease without esophagitis: Secondary | ICD-10-CM | POA: Diagnosis not present

## 2013-01-09 DIAGNOSIS — I369 Nonrheumatic tricuspid valve disorder, unspecified: Secondary | ICD-10-CM | POA: Diagnosis not present

## 2013-01-09 DIAGNOSIS — Z87891 Personal history of nicotine dependence: Secondary | ICD-10-CM | POA: Insufficient documentation

## 2013-01-09 DIAGNOSIS — Z7982 Long term (current) use of aspirin: Secondary | ICD-10-CM | POA: Insufficient documentation

## 2013-01-09 DIAGNOSIS — F458 Other somatoform disorders: Secondary | ICD-10-CM | POA: Diagnosis not present

## 2013-01-09 DIAGNOSIS — R490 Dysphonia: Secondary | ICD-10-CM | POA: Diagnosis present

## 2013-01-09 DIAGNOSIS — R0602 Shortness of breath: Secondary | ICD-10-CM | POA: Diagnosis not present

## 2013-01-09 DIAGNOSIS — R1310 Dysphagia, unspecified: Secondary | ICD-10-CM | POA: Diagnosis not present

## 2013-01-09 DIAGNOSIS — IMO0002 Reserved for concepts with insufficient information to code with codable children: Secondary | ICD-10-CM | POA: Diagnosis not present

## 2013-01-09 DIAGNOSIS — R000 Tachycardia, unspecified: Secondary | ICD-10-CM | POA: Diagnosis not present

## 2013-01-09 DIAGNOSIS — I2510 Atherosclerotic heart disease of native coronary artery without angina pectoris: Secondary | ICD-10-CM | POA: Diagnosis present

## 2013-01-09 DIAGNOSIS — R1312 Dysphagia, oropharyngeal phase: Secondary | ICD-10-CM | POA: Diagnosis not present

## 2013-01-09 DIAGNOSIS — F0390 Unspecified dementia without behavioral disturbance: Secondary | ICD-10-CM | POA: Diagnosis not present

## 2013-01-09 DIAGNOSIS — T17308A Unspecified foreign body in larynx causing other injury, initial encounter: Secondary | ICD-10-CM | POA: Diagnosis not present

## 2013-01-09 DIAGNOSIS — I10 Essential (primary) hypertension: Secondary | ICD-10-CM | POA: Diagnosis not present

## 2013-01-09 DIAGNOSIS — Z8673 Personal history of transient ischemic attack (TIA), and cerebral infarction without residual deficits: Secondary | ICD-10-CM | POA: Diagnosis not present

## 2013-01-09 DIAGNOSIS — J029 Acute pharyngitis, unspecified: Secondary | ICD-10-CM | POA: Diagnosis not present

## 2013-01-09 DIAGNOSIS — I5032 Chronic diastolic (congestive) heart failure: Secondary | ICD-10-CM | POA: Diagnosis not present

## 2013-01-09 DIAGNOSIS — R498 Other voice and resonance disorders: Secondary | ICD-10-CM | POA: Diagnosis not present

## 2013-01-09 DIAGNOSIS — J984 Other disorders of lung: Secondary | ICD-10-CM | POA: Diagnosis not present

## 2013-01-09 DIAGNOSIS — J449 Chronic obstructive pulmonary disease, unspecified: Secondary | ICD-10-CM | POA: Diagnosis present

## 2013-01-09 DIAGNOSIS — R6889 Other general symptoms and signs: Secondary | ICD-10-CM | POA: Diagnosis not present

## 2013-01-09 DIAGNOSIS — I4891 Unspecified atrial fibrillation: Secondary | ICD-10-CM | POA: Diagnosis present

## 2013-01-09 DIAGNOSIS — G9389 Other specified disorders of brain: Secondary | ICD-10-CM | POA: Diagnosis not present

## 2013-01-09 DIAGNOSIS — I509 Heart failure, unspecified: Secondary | ICD-10-CM | POA: Diagnosis not present

## 2013-01-09 NOTE — Progress Notes (Signed)
Patient stating that she can not take a deep breath but saturation is at 98%.

## 2013-01-09 NOTE — ED Notes (Signed)
Patient and family states that she was eating soup and "food went down the wrong way". Patient stated at one point she could not speak. She has an oxygen saturation of 100% at this time on RA, but states that she feels like something is still there. She's speaking, but her voice is a little raspy. Family just wanted to bring her in to be evaluated.

## 2013-01-09 NOTE — ED Provider Notes (Signed)
CSN: [id number]     Arrival date & time 01/09/13  1537 History   First MD Initiated Contact with Patient 01/09/13 1557     Chief Complaint  Patient presents with  . Airway Obstruction   (Consider location/radiation/quality/duration/timing/severity/associated sxs/prior Treatment) HPI This 79 year old female lives at home with family was eating student chicken soup just prior to arrival when she had a choking spell and thought some of the supine down the windpipe she had difficulty talking and was coughing choking but did not require Heimlich maneuver and she was able to talk during her spell he did not have syncope or vomiting, she has a mild persistent foreign body sensation in her throat however is speaking normally again is not short of breath has no chest pain no abdominal pain no vomiting and she is able to swallow her saliva as well as drink water without difficulty now. There is no treatment prior to arrival. She never had complete airway obstruction. Past Medical History  Diagnosis Date  . TOBACCO DEPENDENCE 03/12/2006    Qualifier: Diagnosis of  By: Abundio Miu    . CVA 03/12/2006    Qualifier: Diagnosis of  By: Abundio Miu    . Stroke   . Atrial fibrillation    History reviewed. No pertinent past surgical history. No family history on file. History  Substance Use Topics  . Smoking status: Former Games developer  . Smokeless tobacco: Never Used  . Alcohol Use: No   OB History   Grav Para Term Preterm Abortions TAB SAB Ect Mult Living                 Review of Systems 10 Systems reviewed and are negative for acute change except as noted in the HPI. Allergies  Review of patient's allergies indicates no known allergies.  Home Medications   Current Outpatient Rx  Name  Route  Sig  Dispense  Refill  . amLODipine (NORVASC) 10 MG tablet   Oral   Take 1 tablet (10 mg total) by mouth daily.   30 tablet   6   . aspirin (ADULT ASPIRIN EC LOW STRENGTH) 81 MG EC tablet    Oral   Take 81 mg by mouth daily.          . ergocalciferol (VITAMIN D2) 50000 UNITS capsule   Oral   Take 50,000 Units by mouth once a week. Unknown day of the week         . furosemide (LASIX) 40 MG tablet   Oral   Take 40 mg by mouth daily.         . hydrochlorothiazide (HYDRODIURIL) 25 MG tablet   Oral   Take 25 mg by mouth daily.         . isosorbide mononitrate (IMDUR) 30 MG 24 hr tablet   Oral   Take 30 mg by mouth daily.         . metoprolol (LOPRESSOR) 50 MG tablet   Oral   Take 50 mg by mouth 2 (two) times daily.         . pantoprazole (PROTONIX) 40 MG tablet   Oral   Take 1 tablet (40 mg total) by mouth daily.   30 tablet   6   . simvastatin (ZOCOR) 40 MG tablet   Oral   Take 40 mg by mouth daily.          Marland Kitchen albuterol-ipratropium (COMBIVENT) 18-103 MCG/ACT inhaler   Inhalation   Inhale 1 puff into the  lungs every 6 (six) hours as needed for wheezing or shortness of breath.           BP 146/59  Pulse 115  Temp(Src) 98.1 F (36.7 C) (Oral)  Resp 20  SpO2 100% Physical Exam  Nursing note and vitals reviewed. Constitutional:  Awake, alert, nontoxic appearance.  HENT:  Head: Atraumatic.  Mouth/Throat: Oropharynx is clear and moist.  Eyes: Right eye exhibits no discharge. Left eye exhibits no discharge.  Neck: Neck supple.  Cardiovascular:  No murmur heard. Irregularly irregular rate and rhythm patient with history of chronic atrial fibrillation  Pulmonary/Chest: Effort normal and breath sounds normal. No respiratory distress. She has no wheezes. She has no rales. She exhibits no tenderness.  Pulse oximetry normal on room air at 100%  Abdominal: Soft. Bowel sounds are normal. She exhibits no distension. There is no tenderness. There is no rebound and no guarding.  Musculoskeletal: She exhibits no tenderness.  Baseline ROM, no obvious new focal weakness.  Neurological: She is alert.  Mental status and motor strength appears baseline  for patient and situation.  Skin: No rash noted.  Psychiatric: She has a normal mood and affect.    ED Course  Procedures (including critical care time) Pt stable in ED with no significant deterioration in condition.Patient / Family / Caregiver informed of clinical course, understand medical decision-making process, and agree with plan. Labs Review Labs Reviewed - No data to display Imaging Review Dg Chest 2 View  01/09/2013   CLINICAL DATA:  Airway obstruction.  EXAM: CHEST  2 VIEW  COMPARISON:  12/12/2012  FINDINGS: Mild cardiac enlargement noted. There is no pleural effusion or edema identified. No airspace consolidation noted. The visualized skeletal structures appear unremarkable.  IMPRESSION: 1. No acute cardiopulmonary abnormalities.   Electronically Signed   By: Signa Kell M.D.   On: 01/09/2013 16:25   Ct Head Wo Contrast  01/10/2013   CLINICAL DATA:  Pt with hx of stroke. Pt's daughter states that pt has hx of CVA and has to be careful with swallowing. Pt's daughter states she thinks pt aspirated some food. Pt with sore throat and voice is hoarse. Pt's states she feels like something is sticking in the middle of her throat.  EXAM: CT HEAD WITHOUT CONTRAST  TECHNIQUE: Contiguous axial images were obtained from the base of the skull through the vertex without intravenous contrast.  COMPARISON:  08/12/2009  FINDINGS: There is no evidence of mass effect, midline shift, or extra-axial fluid collections. There is no evidence of a space-occupying lesion or intracranial hemorrhage. There is no evidence of a cortical-based area of acute infarction. There is an old right basal ganglia infarct with encephalomalacia. There are bilateral nonspecific basal ganglia calcifications. There is generalized cerebral atrophy. There is periventricular white matter low attenuation likely secondary to microangiopathy.  The ventricles and sulci are appropriate for the patient's age. The basal cisterns are  patent.  Visualized portions of the orbits are unremarkable. The visualized portions of the paranasal sinuses and mastoid air cells are unremarkable. Cerebrovascular atherosclerotic calcifications are noted.  The osseous structures are unremarkable.  IMPRESSION: No acute intracranial pathology.   Electronically Signed   By: Elige Ko   On: 01/10/2013 19:35   Ct Soft Tissue Neck Wo Contrast  01/10/2013   CLINICAL DATA:  Question aspiration. Patient with sore throat, hoarse voice, feels like something is thickening and mental of current  EXAM: CT NECK WITHOUT CONTRAST  TECHNIQUE: Multidetector CT imaging of the neck was  performed following the standard protocol without intravenous contrast.  COMPARISON:  Prior CT from 08/12/2009  FINDINGS: The visualized portions of the brain are unremarkable.  The visualized orbits are grossly normal. The paranasal sinuses are clear. No mastoid effusion within the partially visualized mastoid air cells.  The salivary glands including the parotid glands and submandibular glands demonstrate a normal appearance and are symmetric in size bilaterally.  The oral cavity is within normal limits. Oropharynx is clear. The parapharyngeal fat is preserved bilaterally. There is asymmetric fullness within the right posterior nasopharynx and fossa of Rosenmuller (series 2, image 27). No definite discrete mass lesion identified on this noncontrast CT.  Evaluation of the airway is somewhat limited by motion artifact. The vallecula is grossly clear. Epiglottis is normal. The hypopharynx and supraglottic larynx are within normal limits. True vocal cords are symmetric bilaterally. Subglottic airway is clear. Esophagus is within normal limits.  No pathologically enlarged cervical lymph nodes are identified. No adenopathy seen within the visualized superior mediastinum. No mass lesion or loculated fluid collection identified within the neck.  Thyroid gland is within normal limits.  Atherosclerotic  calcifications noted about the carotid bifurcations bilaterally.  The visualized portions of the upper lobes are clear.  Multilevel degenerative changes noted within the visualized spine. There is chronic grade 1 anterolisthesis of C3 on C4 and C4 on C5. Moderate degenerative disc disease as evidenced by intervertebral disc space narrowing, endplate sclerosis, and endplate osteophytosis is seen at C5-6 and C6-7. No focal lytic or blastic osseous lesions identified.  IMPRESSION: 1. Asymmetric fullness within the right posterior nasopharynx without definite discrete mass lesion. This finding is indeterminate, and may be partially related to patient positioning. Correlation with direct visualization for further evaluation could be performed as clinically indicated. 2. Otherwise unremarkable noncontrast CT of the neck without evidence of mass lesion, adenopathy, or loculated fluid collection.   Electronically Signed   By: Rise Mu M.D.   On: 01/10/2013 17:46   Ct Chest Wo Contrast  01/10/2013   CLINICAL DATA:  Sore throat. , course, choking, evaluate for foreign body.  EXAM: CT CHEST WITHOUT CONTRAST  TECHNIQUE: Multidetector CT imaging of the chest was performed following the standard protocol without IV contrast.  COMPARISON:  None.  FINDINGS: Noncontrast evaluation of thoracic inlet is unremarkable.  There is no evidence of radiopaque foreign bodies projecting in the the lumen of the esophagus or along the course of the esophagus. There is not appear to be significant esophageal wall thickening. There is no evidence of esophageal dilation.  Multichamber cardiac enlargement is appreciated. Atherosclerotic calcifications in the aorta and coronary arteries are identified. Within the limitations of a noncontrast CT there is no evidence of pathologic sized adenopathy nor masses within the mediastinum or hilar regions. The lung parenchyma demonstrates no focal reason consolidation. Mild areas of  interstitial prominence identified within the lung bases mild focal regions of consolidation. These findings appear to be of greatest confluence in the right infrahilar region. There is a component of mild subpleural septal thickening in the right lung base. A vague focus of ground-glass density projects within the posterior aspect of the lateral segment right middle lobe image number 25 series 3 measuring approximately 1.2 x 1 cm.  Visualized upper abdominal viscera demonstrate no gross abnormalities. Atherosclerotic calcifications are appreciated within the abdominal aorta and mesenteric vessels.  IMPRESSION: 1. Nonspecific vague finding within the lateral segment right middle lobe differential considerations are scarring versus posterior early or mild infiltrate. Surveillance evaluation  of this finding and 3-6 months is recommended. 2. Mild interstitial findings within the lung bases right greater than left. 3. No CT evidence of a radiopaque foreign body within the lumen of the esophagus nor along the course of the esophagus. 4. Cardiomegaly 5. Atherosclerotic disease   Electronically Signed   By: Salome Holmes M.D.   On: 01/10/2013 17:34    EKG Interpretation   None       MDM   1. Choking, initial encounter    I doubt any other EMC precluding discharge at this time including, but not necessarily limited to the following:aspiration PNA or airway compromise or impacted esophageal foreign body.    Hurman Horn, MD 01/10/13 2031

## 2013-01-09 NOTE — ED Notes (Signed)
Bed: ZO10 Expected date: 01/09/13 Expected time: 3:29 PM Means of arrival:  Comments: EMS FB in throat ?

## 2013-01-10 ENCOUNTER — Encounter (HOSPITAL_COMMUNITY): Payer: Self-pay | Admitting: Emergency Medicine

## 2013-01-10 ENCOUNTER — Emergency Department (HOSPITAL_COMMUNITY): Payer: Medicare Other

## 2013-01-10 ENCOUNTER — Inpatient Hospital Stay (HOSPITAL_COMMUNITY)
Admission: EM | Admit: 2013-01-10 | Discharge: 2013-01-14 | DRG: 392 | Disposition: A | Discharge status: Home Health | Payer: Medicare Other | Attending: Family Medicine | Admitting: Family Medicine

## 2013-01-10 ENCOUNTER — Inpatient Hospital Stay (HOSPITAL_COMMUNITY): Payer: Medicare Other

## 2013-01-10 DIAGNOSIS — R499 Unspecified voice and resonance disorder: Secondary | ICD-10-CM

## 2013-01-10 DIAGNOSIS — J984 Other disorders of lung: Secondary | ICD-10-CM | POA: Diagnosis not present

## 2013-01-10 DIAGNOSIS — R000 Tachycardia, unspecified: Secondary | ICD-10-CM | POA: Diagnosis not present

## 2013-01-10 DIAGNOSIS — Z8673 Personal history of transient ischemic attack (TIA), and cerebral infarction without residual deficits: Secondary | ICD-10-CM

## 2013-01-10 DIAGNOSIS — M1710 Unilateral primary osteoarthritis, unspecified knee: Secondary | ICD-10-CM

## 2013-01-10 DIAGNOSIS — E7800 Pure hypercholesterolemia, unspecified: Secondary | ICD-10-CM

## 2013-01-10 DIAGNOSIS — E785 Hyperlipidemia, unspecified: Secondary | ICD-10-CM | POA: Diagnosis present

## 2013-01-10 DIAGNOSIS — R498 Other voice and resonance disorders: Secondary | ICD-10-CM | POA: Diagnosis not present

## 2013-01-10 DIAGNOSIS — I5032 Chronic diastolic (congestive) heart failure: Secondary | ICD-10-CM

## 2013-01-10 DIAGNOSIS — I10 Essential (primary) hypertension: Secondary | ICD-10-CM

## 2013-01-10 DIAGNOSIS — I509 Heart failure, unspecified: Secondary | ICD-10-CM | POA: Diagnosis present

## 2013-01-10 DIAGNOSIS — IMO0002 Reserved for concepts with insufficient information to code with codable children: Secondary | ICD-10-CM

## 2013-01-10 DIAGNOSIS — J029 Acute pharyngitis, unspecified: Secondary | ICD-10-CM | POA: Diagnosis present

## 2013-01-10 DIAGNOSIS — J4489 Other specified chronic obstructive pulmonary disease: Secondary | ICD-10-CM

## 2013-01-10 DIAGNOSIS — R490 Dysphonia: Secondary | ICD-10-CM | POA: Diagnosis present

## 2013-01-10 DIAGNOSIS — I70219 Atherosclerosis of native arteries of extremities with intermittent claudication, unspecified extremity: Secondary | ICD-10-CM

## 2013-01-10 DIAGNOSIS — R1310 Dysphagia, unspecified: Secondary | ICD-10-CM

## 2013-01-10 DIAGNOSIS — R6889 Other general symptoms and signs: Secondary | ICD-10-CM | POA: Diagnosis not present

## 2013-01-10 DIAGNOSIS — F0390 Unspecified dementia without behavioral disturbance: Secondary | ICD-10-CM | POA: Diagnosis present

## 2013-01-10 DIAGNOSIS — R1312 Dysphagia, oropharyngeal phase: Principal | ICD-10-CM | POA: Diagnosis present

## 2013-01-10 DIAGNOSIS — I2510 Atherosclerotic heart disease of native coronary artery without angina pectoris: Secondary | ICD-10-CM

## 2013-01-10 DIAGNOSIS — J449 Chronic obstructive pulmonary disease, unspecified: Secondary | ICD-10-CM | POA: Diagnosis present

## 2013-01-10 DIAGNOSIS — I4891 Unspecified atrial fibrillation: Secondary | ICD-10-CM | POA: Diagnosis present

## 2013-01-10 DIAGNOSIS — T17308A Unspecified foreign body in larynx causing other injury, initial encounter: Secondary | ICD-10-CM

## 2013-01-10 LAB — CBC WITH DIFFERENTIAL/PLATELET
Basophils Absolute: 0 10*3/uL (ref 0.0–0.1)
Basophils Relative: 0 % (ref 0–1)
Eosinophils Absolute: 0.1 10*3/uL (ref 0.0–0.7)
Hemoglobin: 12.7 g/dL (ref 12.0–15.0)
MCH: 31.2 pg (ref 26.0–34.0)
MCHC: 34 g/dL (ref 30.0–36.0)
Monocytes Absolute: 0.9 10*3/uL (ref 0.1–1.0)
Monocytes Relative: 13 % — ABNORMAL HIGH (ref 3–12)
Neutrophils Relative %: 67 % (ref 43–77)
RDW: 13.1 % (ref 11.5–15.5)

## 2013-01-10 LAB — POCT I-STAT TROPONIN I: Troponin i, poc: 0.05 ng/mL (ref 0.00–0.08)

## 2013-01-10 LAB — POCT I-STAT, CHEM 8
Calcium, Ion: 1.17 mmol/L (ref 1.13–1.30)
Glucose, Bld: 114 mg/dL — ABNORMAL HIGH (ref 70–99)
HCT: 45 % (ref 36.0–46.0)
Hemoglobin: 15.3 g/dL — ABNORMAL HIGH (ref 12.0–15.0)
Potassium: 4.2 mEq/L (ref 3.5–5.1)

## 2013-01-10 MED ORDER — HEPARIN SODIUM (PORCINE) 5000 UNIT/ML IJ SOLN
5000.0000 [IU] | Freq: Three times a day (TID) | INTRAMUSCULAR | Status: DC
  Administered 2013-01-11 – 2013-01-14 (×10): 5000 [IU] via SUBCUTANEOUS
  Filled 2013-01-10 (×14): qty 1

## 2013-01-10 MED ORDER — ASPIRIN 300 MG RE SUPP
300.0000 mg | Freq: Every day | RECTAL | Status: DC
  Filled 2013-01-10 (×2): qty 1

## 2013-01-10 MED ORDER — IPRATROPIUM-ALBUTEROL 18-103 MCG/ACT IN AERO: 1.0000 | INHALATION_SPRAY | Freq: Four times a day (QID) | RESPIRATORY_TRACT | Status: DC | PRN

## 2013-01-10 MED ORDER — DEXTROSE-NACL 5-0.45 % IV SOLN
INTRAVENOUS | Status: DC
  Administered 2013-01-11 – 2013-01-12 (×2): via INTRAVENOUS

## 2013-01-10 MED ORDER — SODIUM CHLORIDE 0.9 % IV BOLUS (SEPSIS)
500.0000 mL | Freq: Once | INTRAVENOUS | Status: AC
  Administered 2013-01-10: 500 mL via INTRAVENOUS

## 2013-01-10 MED ORDER — SENNOSIDES-DOCUSATE SODIUM 8.6-50 MG PO TABS
1.0000 | ORAL_TABLET | Freq: Every evening | ORAL | Status: DC | PRN
  Filled 2013-01-10: qty 1

## 2013-01-10 MED ORDER — METOPROLOL TARTRATE 1 MG/ML IV SOLN
5.0000 mg | Freq: Four times a day (QID) | INTRAVENOUS | Status: DC
  Administered 2013-01-10 – 2013-01-14 (×16): 5 mg via INTRAVENOUS
  Filled 2013-01-10 (×20): qty 5

## 2013-01-10 MED ORDER — STROKE: EARLY STAGES OF RECOVERY BOOK
Freq: Once | Status: AC
  Administered 2013-01-10: 23:00:00
  Filled 2013-01-10: qty 1

## 2013-01-10 NOTE — H&P (Signed)
Family Medicine Teaching [facility] Admission History and Physical Service Pager: [phone]  Patient name: Bethany Cardenas Medical record number: [id number] Date of birth: March 08, 1922 Age: 79 y.o. Gender: female  Primary Care Provider: Felix Pacini, DO Consultants: none Code Status: full  Chief Complaint: sore throat, hoarseness, feeling like something caught in her throat  Assessment and Plan: Bethany BRYAND is a 79 y.o. female with a history of afib, CVA, CAD, HLD, dementia, HTN, COPD presenting with a change in her voice and the sensation that something is caught in her throat.  # Hoarseness/voice change/dysphagia: patient with sudden onset of sensation of something caught in her throat yesterday with progressive hoarseness and voice change. Given history of CVA and afib not on anticoagulation (especially with chads-vasc score of 7) there is concern that this could be related to a CVA, though this may be an unlikely presentation given there are no other neuro changes other than voice change. Must also consider GI causes, such as reflux, stricture, esophageal cancer, and pulmonary tree issues such as a laryngeal cancer. -will admit to tele, attending Dr Lum Babe -will proceed with stroke work up-MRI/MRA, echo, carotid dopplers, lipid panel, A1c, monitor on tele, will increase ASA to 300 mg daily rectally -will plan to consult GI in the morning for further evaluation of this dysphagia -will start on IV protonix for potential GI cause -patient failed bedside swallow, so will need SLP eval -PT/OT to eval as well  # A fib: rate increased to the 120's-130's in the ED. Patient without any symptoms with this. -given failed swallow study will plan on rate control with metoprolol 5 mg IV q6 hours -will monitor on tele -repeat EKG in the am -consider cardiology consult in the am as it appears the patient has been without a cardiologist for the past year  # HTN: normal range on  admission. -given concern for possible stroke will hold all anti-HTN medications with exception of metoprolol for rate control to allow for permissive HTN  # History of diastolic CHF: no recent echo with last in 2006 without mention of diastolic dysfunction. -will obtain repeat echo -will hydrate gently given NPO status with D5 1/2 NS @ 75 mL/hr -monitor fluid status with strict I/O's  # COPD: will continue home combivent  FEN/GI: NPO until passes speech eval, D5 1/2 NS 75 mL/hr Prophylaxis: heparin SQ  Disposition: admit to tele, pending completion of evaluation  History of Present Illness: Bethany Cardenas is a 79 y.o. female with a history of afib, CVA, CAD, HLD, dementia, HTN, COPD presenting with a change in her voice and the sensation that something is caught in her throat. The patient states on 01/09/13 around 2 pm she was eating some chicken stew when she noted a choking sensation and then feeling like she could not breath. They called EMS and they stated her pulse and oxygen were ok, but she was brought to the ED for evaluation. Per the ED note the patient felt as though some of the stew went down her windpipe and she had coughing and trouble talking afterward. She did not require the heimlich and was able to talk during this event. She had a CXR in the ED that revealed no acute process. At that time she denied chest pain, abdominal pain, vomiting, and was able to handle her secretions well. She was discharged home, but since that time the patients family notes that her voice has become more raspy and less understandable to them. At this  time the patient denies difficulty breathing and has no irritation in her throat, though she states earlier she had some difficulty breathing and irritation. She denies chest pain, weakness, numbness, and tingling.  In the ED the patient had a CT neck and a chest to evaluate for foreign body and mass. The CT neck revealed an asymmetric fullness in the right  posterior nasopharynx without a discrete mass, otherwise this study was unremarkable. Her CT chest revealed scarring versus posterior early  or mild infiltrate and mild interstitial findings in the lung bases R>L. She had a CT head that revealed no acute findings. Her CBC and chem 8 were unremarkable. She had a negative istat troponin.  Review Of Systems: Per HPI with the following additions: none Otherwise 12 point review of systems was performed and was unremarkable.  Patient Active Problem List   Diagnosis Date Noted  . Change in voice 01/10/2013  . Infection of urinary tract 12/14/2012  . Cerumen impaction 12/14/2012  . Unspecified vitamin D deficiency 07/03/2012  . Diarrhea 07/02/2012  . Left leg pain 07/02/2012  . Cautious gait 04/11/2010  . Chronic diastolic heart failure 12/10/2009  . CORONARY ARTERY DISEASE 08/12/2006  . HYPERCHOLESTEROLEMIA 03/12/2006  . DEMENTIA, NOT SPECIFIED 03/12/2006  . HYPERTENSION, BENIGN SYSTEMIC 03/12/2006  . PERIPHERAL VASCULAR DISEASE, UNSPEC. 03/12/2006  . COPD 03/12/2006  . OSTEOARTHRITIS, LOWER LEG 03/12/2006   Past Medical History: Past Medical History  Diagnosis Date  . TOBACCO DEPENDENCE 03/12/2006    Qualifier: Diagnosis of  By: Abundio Miu    . CVA 03/12/2006    Qualifier: Diagnosis of  By: Abundio Miu    . Stroke   . Atrial fibrillation    Past Surgical History: History reviewed. No pertinent past surgical history. Social History: History  Substance Use Topics  . Smoking status: Former Games developer  . Smokeless tobacco: Never Used  . Alcohol Use: No   Additional social history: none  Please also refer to relevant sections of EMR.  Family History: No family history on file. Allergies and Medications: No Known Allergies No current facility-administered medications on file prior to encounter.   Current Outpatient Prescriptions on File Prior to Encounter  Medication Sig Dispense Refill  . albuterol-ipratropium  (COMBIVENT) 18-103 MCG/ACT inhaler Inhale 1 puff into the lungs every 6 (six) hours as needed for wheezing or shortness of breath.       Marland Kitchen amLODipine (NORVASC) 10 MG tablet Take 1 tablet (10 mg total) by mouth daily.  30 tablet  6  . aspirin (ADULT ASPIRIN EC LOW STRENGTH) 81 MG EC tablet Take 81 mg by mouth daily.       . ergocalciferol (VITAMIN D2) 50000 UNITS capsule Take 50,000 Units by mouth once a week. Unknown day of the week      . furosemide (LASIX) 40 MG tablet Take 40 mg by mouth daily.      . hydrochlorothiazide (HYDRODIURIL) 25 MG tablet Take 25 mg by mouth daily.      . isosorbide mononitrate (IMDUR) 30 MG 24 hr tablet Take 30 mg by mouth daily.      . metoprolol (LOPRESSOR) 50 MG tablet Take 50 mg by mouth 2 (two) times daily.      . pantoprazole (PROTONIX) 40 MG tablet Take 1 tablet (40 mg total) by mouth daily.  30 tablet  6  . simvastatin (ZOCOR) 40 MG tablet Take 40 mg by mouth daily.         Objective: BP 121/78  Pulse 106  Temp(Src) 97.8 F (36.6 C) (Oral)  Resp 19  SpO2 99% Exam: General: NAD, sitting in bed comfortably HEENT: NCAT, MMM, no apparent oral lesions, patient would not let me stick the tongue depressor in to further evaluate her throat Cardiovascular: irr irr, no murmurs appreciated Respiratory: Clear with exception of upper airway sounds that sounded as though the patient is groaning, when asked to not make noise while breathing these sounds would go away Abdomen: s, NT, ND Extremities: no edema Skin: no lesions noted Neuro: alert, speech is difficult to understand, it is raspy and mumbled, otherwise CN 2-12 intact with good palate elevation, 5/5 strength in bilateral biceps, triceps, grip, hip flexors, quads, plantar flexion and dorsiflexion, sensation to light touch intact throughout bilateral upper and lower extremities  Labs and Imaging: CBC BMET   Recent Labs Lab 01/10/13 1833  WBC 6.8  HGB 12.7  HCT 37.3  PLT 171    Recent Labs Lab  01/10/13 1657  NA 139  K 4.2  CL 103  BUN 13  CREATININE 0.70  GLUCOSE 114*     Results for orders placed during the hospital encounter of 01/10/13 (from the past 24 hour(s))  POCT I-STAT TROPONIN I     Status: None   Collection Time    01/10/13  4:55 PM      Result Value Range   Troponin i, poc 0.05  0.00 - 0.08 ng/mL   Comment 3           POCT I-STAT, CHEM 8     Status: Abnormal   Collection Time    01/10/13  4:57 PM      Result Value Range   Sodium 139  135 - 145 mEq/L   Potassium 4.2  3.5 - 5.1 mEq/L   Chloride 103  96 - 112 mEq/L   BUN 13  6 - 23 mg/dL   Creatinine, Ser 1.61  0.50 - 1.10 mg/dL   Glucose, Bld 096 (*) 70 - 99 mg/dL   Calcium, Ion 0.45  4.09 - 1.30 mmol/L   TCO2 26  0 - 100 mmol/L   Hemoglobin 15.3 (*) 12.0 - 15.0 g/dL   HCT 81.1  91.4 - 78.2 %  CBC WITH DIFFERENTIAL     Status: Abnormal   Collection Time    01/10/13  6:33 PM      Result Value Range   WBC 6.8  4.0 - 10.5 K/uL   RBC 4.07  3.87 - 5.11 MIL/uL   Hemoglobin 12.7  12.0 - 15.0 g/dL   HCT 95.6  21.3 - 08.6 %   MCV 91.6  78.0 - 100.0 fL   MCH 31.2  26.0 - 34.0 pg   MCHC 34.0  30.0 - 36.0 g/dL   RDW 57.8  46.9 - 62.9 %   Platelets 171  150 - 400 K/uL   Neutrophils Relative % 67  43 - 77 %   Neutro Abs 4.6  1.7 - 7.7 K/uL   Lymphocytes Relative 19  12 - 46 %   Lymphs Abs 1.3  0.7 - 4.0 K/uL   Monocytes Relative 13 (*) 3 - 12 %   Monocytes Absolute 0.9  0.1 - 1.0 K/uL   Eosinophils Relative 1  0 - 5 %   Eosinophils Absolute 0.1  0.0 - 0.7 K/uL   Basophils Relative 0  0 - 1 %   Basophils Absolute 0.0  0.0 - 0.1 K/uL   Dg Chest 2 View  01/09/2013  IMPRESSION: 1. No acute cardiopulmonary abnormalities.   Electronically Signed   By: Signa Kell M.D.   On: 01/09/2013 16:25   Ct Head Wo Contrast  01/10/2013    IMPRESSION: No acute intracranial pathology.   Electronically Signed   By: Elige Ko   On: 01/10/2013 19:35   Ct Soft Tissue Neck Wo Contrast  01/10/2013    IMPRESSION:  1. Asymmetric fullness within the right posterior nasopharynx without definite discrete mass lesion. This finding is indeterminate, and may be partially related to patient positioning. Correlation with direct visualization for further evaluation could be performed as clinically indicated. 2. Otherwise unremarkable noncontrast CT of the neck without evidence of mass lesion, adenopathy, or loculated fluid collection.   Electronically Signed   By: Rise Mu M.D.   On: 01/10/2013 17:46   Ct Chest Wo Contrast  01/10/2013    IMPRESSION: 1. Nonspecific vague finding within the lateral segment right middle lobe differential considerations are scarring versus posterior early or mild infiltrate. Surveillance evaluation of this finding and 3-6 months is recommended. 2. Mild interstitial findings within the lung bases right greater than left. 3. No CT evidence of a radiopaque foreign body within the lumen of the esophagus nor along the course of the esophagus. 4. Cardiomegaly 5. Atherosclerotic disease   Electronically Signed   By: Salome Holmes M.D.   On: 01/10/2013 17:34     Glori Luis, MD 01/10/2013, 8:07 PM PGY-2, [facility]PT[facility] Intern pager: [phone], text pages welcome

## 2013-01-10 NOTE — ED Notes (Signed)
Pt with hx of stroke.  Pt's daughter states that pt has hx of CVA and has to be careful with swallowing.  Pt's daughter states she thinks pt aspirated some food.  Pt with sore throat and voice is hoarse.  Pt's states she feels like something is sticking in the middle of her throat.

## 2013-01-10 NOTE — ED Provider Notes (Signed)
CSN: [id number]     Arrival date & time 01/10/13  1404 History   First MD Initiated Contact with Patient 01/10/13 1621     Chief Complaint  Patient presents with  . Sore Throat  . Hoarse  . Dysphagia   (Consider location/radiation/quality/duration/timing/severity/associated sxs/prior Treatment) Patient is a 79 y.o. female presenting with pharyngitis. The history is provided by the patient.  Sore Throat Associated symptoms include shortness of breath. Pertinent negatives include no chest pain and no abdominal pain.   patient choked on some chicken soup yesterday. Felt as if some food went down the wrong pipe. She was seen in the ED and had negative x-ray. She's continued to have difficulty breathing and difficulty talking. Patient gives very little history mostly comes from someone else in the room. The patient reportedly has not been able to the much in her voice sounds different. No fevers. No vomiting,, however they state that food will not go down. She has a mild sore throat. She's has had occasional episodes with difficulty swallowing after previous stroke.  Past Medical History  Diagnosis Date  . TOBACCO DEPENDENCE 03/12/2006    Qualifier: Diagnosis of  By: Abundio Miu    . CVA 03/12/2006    Qualifier: Diagnosis of  By: Abundio Miu    . Stroke   . Atrial fibrillation    History reviewed. No pertinent past surgical history. No family history on file. History  Substance Use Topics  . Smoking status: Former Games developer  . Smokeless tobacco: Never Used  . Alcohol Use: No   OB History   Grav Para Term Preterm Abortions TAB SAB Ect Mult Living                 Review of Systems  Constitutional: Negative for fever, activity change and appetite change.  HENT: Positive for sore throat and voice change.   Eyes: Negative for visual disturbance.  Respiratory: Positive for cough and shortness of breath.   Cardiovascular: Negative for chest pain.  Gastrointestinal: Negative for  abdominal pain.  Genitourinary: Negative for dyspareunia.  Skin: Negative for wound.  Neurological: Negative for light-headedness.    Allergies  Review of patient's allergies indicates no known allergies.  Home Medications   No current outpatient prescriptions on file. BP 148/84  Pulse 109  Temp(Src) 97.8 F (36.6 C) (Oral)  Resp 18  Ht 5' (1.524 m)  Wt 129 lb 6.6 oz (58.7 kg)  BMI 25.27 kg/m2  SpO2 97% Physical Exam  Constitutional: She appears well-developed.  HENT:  Head: Normocephalic.  Difficulty viewing posterior pharynx. Harsh voice.  Eyes: EOM are normal. Pupils are equal, round, and reactive to light.  Neck: Neck supple.  Cardiovascular:  Tachycardia  Pulmonary/Chest: She has wheezes.  Mild diffuse wheezes and prolonged expirations. Possible mild stridor  Abdominal: There is no tenderness.  Musculoskeletal: Normal range of motion.  Neurological: She is alert.  Patient appears awake and in her reported baseline.  Skin: Skin is warm.    ED Course  Procedures (including critical care time) Labs Review Labs Reviewed  CBC WITH DIFFERENTIAL - Abnormal; Notable for the following:    Monocytes Relative 13 (*)    All other components within normal limits  POCT I-STAT, CHEM 8 - Abnormal; Notable for the following:    Glucose, Bld 114 (*)    Hemoglobin 15.3 (*)    All other components within normal limits  HEMOGLOBIN A1C  LIPID PANEL  CBC  BASIC METABOLIC PANEL  POCT I-STAT TROPONIN  I   Imaging Review Dg Chest 2 View  01/09/2013   CLINICAL DATA:  Airway obstruction.  EXAM: CHEST  2 VIEW  COMPARISON:  12/12/2012  FINDINGS: Mild cardiac enlargement noted. There is no pleural effusion or edema identified. No airspace consolidation noted. The visualized skeletal structures appear unremarkable.  IMPRESSION: 1. No acute cardiopulmonary abnormalities.   Electronically Signed   By: Signa Kell M.D.   On: 01/09/2013 16:25   Ct Head Wo Contrast  01/10/2013    CLINICAL DATA:  Pt with hx of stroke. Pt's daughter states that pt has hx of CVA and has to be careful with swallowing. Pt's daughter states she thinks pt aspirated some food. Pt with sore throat and voice is hoarse. Pt's states she feels like something is sticking in the middle of her throat.  EXAM: CT HEAD WITHOUT CONTRAST  TECHNIQUE: Contiguous axial images were obtained from the base of the skull through the vertex without intravenous contrast.  COMPARISON:  08/12/2009  FINDINGS: There is no evidence of mass effect, midline shift, or extra-axial fluid collections. There is no evidence of a space-occupying lesion or intracranial hemorrhage. There is no evidence of a cortical-based area of acute infarction. There is an old right basal ganglia infarct with encephalomalacia. There are bilateral nonspecific basal ganglia calcifications. There is generalized cerebral atrophy. There is periventricular white matter low attenuation likely secondary to microangiopathy.  The ventricles and sulci are appropriate for the patient's age. The basal cisterns are patent.  Visualized portions of the orbits are unremarkable. The visualized portions of the paranasal sinuses and mastoid air cells are unremarkable. Cerebrovascular atherosclerotic calcifications are noted.  The osseous structures are unremarkable.  IMPRESSION: No acute intracranial pathology.   Electronically Signed   By: Elige Ko   On: 01/10/2013 19:35   Ct Soft Tissue Neck Wo Contrast  01/10/2013   CLINICAL DATA:  Question aspiration. Patient with sore throat, hoarse voice, feels like something is thickening and mental of current  EXAM: CT NECK WITHOUT CONTRAST  TECHNIQUE: Multidetector CT imaging of the neck was performed following the standard protocol without intravenous contrast.  COMPARISON:  Prior CT from 08/12/2009  FINDINGS: The visualized portions of the brain are unremarkable.  The visualized orbits are grossly normal. The paranasal sinuses are  clear. No mastoid effusion within the partially visualized mastoid air cells.  The salivary glands including the parotid glands and submandibular glands demonstrate a normal appearance and are symmetric in size bilaterally.  The oral cavity is within normal limits. Oropharynx is clear. The parapharyngeal fat is preserved bilaterally. There is asymmetric fullness within the right posterior nasopharynx and fossa of Rosenmuller (series 2, image 27). No definite discrete mass lesion identified on this noncontrast CT.  Evaluation of the airway is somewhat limited by motion artifact. The vallecula is grossly clear. Epiglottis is normal. The hypopharynx and supraglottic larynx are within normal limits. True vocal cords are symmetric bilaterally. Subglottic airway is clear. Esophagus is within normal limits.  No pathologically enlarged cervical lymph nodes are identified. No adenopathy seen within the visualized superior mediastinum. No mass lesion or loculated fluid collection identified within the neck.  Thyroid gland is within normal limits.  Atherosclerotic calcifications noted about the carotid bifurcations bilaterally.  The visualized portions of the upper lobes are clear.  Multilevel degenerative changes noted within the visualized spine. There is chronic grade 1 anterolisthesis of C3 on C4 and C4 on C5. Moderate degenerative disc disease as evidenced by intervertebral disc space narrowing,  endplate sclerosis, and endplate osteophytosis is seen at C5-6 and C6-7. No focal lytic or blastic osseous lesions identified.  IMPRESSION: 1. Asymmetric fullness within the right posterior nasopharynx without definite discrete mass lesion. This finding is indeterminate, and may be partially related to patient positioning. Correlation with direct visualization for further evaluation could be performed as clinically indicated. 2. Otherwise unremarkable noncontrast CT of the neck without evidence of mass lesion, adenopathy, or  loculated fluid collection.   Electronically Signed   By: Rise Mu M.D.   On: 01/10/2013 17:46   Ct Chest Wo Contrast  01/10/2013   CLINICAL DATA:  Sore throat. , course, choking, evaluate for foreign body.  EXAM: CT CHEST WITHOUT CONTRAST  TECHNIQUE: Multidetector CT imaging of the chest was performed following the standard protocol without IV contrast.  COMPARISON:  None.  FINDINGS: Noncontrast evaluation of thoracic inlet is unremarkable.  There is no evidence of radiopaque foreign bodies projecting in the the lumen of the esophagus or along the course of the esophagus. There is not appear to be significant esophageal wall thickening. There is no evidence of esophageal dilation.  Multichamber cardiac enlargement is appreciated. Atherosclerotic calcifications in the aorta and coronary arteries are identified. Within the limitations of a noncontrast CT there is no evidence of pathologic sized adenopathy nor masses within the mediastinum or hilar regions. The lung parenchyma demonstrates no focal reason consolidation. Mild areas of interstitial prominence identified within the lung bases mild focal regions of consolidation. These findings appear to be of greatest confluence in the right infrahilar region. There is a component of mild subpleural septal thickening in the right lung base. A vague focus of ground-glass density projects within the posterior aspect of the lateral segment right middle lobe image number 25 series 3 measuring approximately 1.2 x 1 cm.  Visualized upper abdominal viscera demonstrate no gross abnormalities. Atherosclerotic calcifications are appreciated within the abdominal aorta and mesenteric vessels.  IMPRESSION: 1. Nonspecific vague finding within the lateral segment right middle lobe differential considerations are scarring versus posterior early or mild infiltrate. Surveillance evaluation of this finding and 3-6 months is recommended. 2. Mild interstitial findings within  the lung bases right greater than left. 3. No CT evidence of a radiopaque foreign body within the lumen of the esophagus nor along the course of the esophagus. 4. Cardiomegaly 5. Atherosclerotic disease   Electronically Signed   By: Salome Holmes M.D.   On: 01/10/2013 17:34    EKG Interpretation    Date/Time:  Monday January 10 2013 17:39:05 EST Ventricular Rate:  133 PR Interval:    QRS Duration: 68 QT Interval:  349 QTC Calculation: 519 R Axis:   -24 Text Interpretation:  Atrial fibrillation Borderline left axis deviation Anterior infarct, old Nonspecific T abnormalities, lateral leads Prolonged QT interval Baseline wander in lead(s) I II aVR aVL aVF V1 V2 V3 V4 V5 V6 Confirmed by Justine Dines  MD, Maridel Pixler ([id number]) on 01/10/2013 11:59:09 PM            MDM   1. Change in voice   2. Choking, initial encounter    Patient with change in her voice and choking. Episode occurred a day ago. Seen in ER for same. Imaging negative here after CT. Will admit to patient's primary care Dr. Patient has had previous strokes. She also has atrial fibrillation with RVR.    Juliet Rude. Rubin Payor, MD 01/10/13 [phone]

## 2013-01-11 ENCOUNTER — Inpatient Hospital Stay (HOSPITAL_COMMUNITY): Payer: Medicare Other

## 2013-01-11 DIAGNOSIS — I6350 Cerebral infarction due to unspecified occlusion or stenosis of unspecified cerebral artery: Secondary | ICD-10-CM | POA: Diagnosis not present

## 2013-01-11 DIAGNOSIS — R4789 Other speech disturbances: Secondary | ICD-10-CM | POA: Diagnosis not present

## 2013-01-11 DIAGNOSIS — I10 Essential (primary) hypertension: Secondary | ICD-10-CM

## 2013-01-11 DIAGNOSIS — R1312 Dysphagia, oropharyngeal phase: Secondary | ICD-10-CM | POA: Diagnosis not present

## 2013-01-11 DIAGNOSIS — I658 Occlusion and stenosis of other precerebral arteries: Secondary | ICD-10-CM | POA: Diagnosis not present

## 2013-01-11 DIAGNOSIS — I651 Occlusion and stenosis of basilar artery: Secondary | ICD-10-CM | POA: Diagnosis not present

## 2013-01-11 DIAGNOSIS — R498 Other voice and resonance disorders: Secondary | ICD-10-CM | POA: Diagnosis not present

## 2013-01-11 DIAGNOSIS — I369 Nonrheumatic tricuspid valve disorder, unspecified: Secondary | ICD-10-CM

## 2013-01-11 DIAGNOSIS — T17308A Unspecified foreign body in larynx causing other injury, initial encounter: Secondary | ICD-10-CM

## 2013-01-11 DIAGNOSIS — F0390 Unspecified dementia without behavioral disturbance: Secondary | ICD-10-CM | POA: Diagnosis not present

## 2013-01-11 DIAGNOSIS — I509 Heart failure, unspecified: Secondary | ICD-10-CM | POA: Diagnosis not present

## 2013-01-11 DIAGNOSIS — I5032 Chronic diastolic (congestive) heart failure: Secondary | ICD-10-CM | POA: Diagnosis not present

## 2013-01-11 DIAGNOSIS — IMO0002 Reserved for concepts with insufficient information to code with codable children: Secondary | ICD-10-CM | POA: Diagnosis not present

## 2013-01-11 LAB — BASIC METABOLIC PANEL
BUN: 8 mg/dL (ref 6–23)
CO2: 23 mEq/L (ref 19–32)
Chloride: 101 mEq/L (ref 96–112)
Creatinine, Ser: 0.72 mg/dL (ref 0.50–1.10)
GFR calc non Af Amer: 74 mL/min — ABNORMAL LOW (ref >90)
Glucose, Bld: 110 mg/dL — ABNORMAL HIGH (ref 70–99)
Sodium: 139 mEq/L (ref 137–147)

## 2013-01-11 LAB — CBC
HCT: 41.8 % (ref 36.0–46.0)
Hemoglobin: 14.6 g/dL (ref 12.0–15.0)
MCV: 91.9 fL (ref 78.0–100.0)
Platelets: 158 10*3/uL (ref 150–400)
RBC: 4.55 MIL/uL (ref 3.87–5.11)
WBC: 6 10*3/uL (ref 4.0–10.5)

## 2013-01-11 LAB — LIPID PANEL
LDL Cholesterol: 65 mg/dL (ref 0–99)
Total CHOL/HDL Ratio: 2 RATIO
VLDL: 9 mg/dL (ref 0–40)

## 2013-01-11 LAB — HEMOGLOBIN A1C
Hgb A1c MFr Bld: 6.4 % — ABNORMAL HIGH
Mean Plasma Glucose: 137 mg/dL — ABNORMAL HIGH

## 2013-01-11 MED ORDER — PANTOPRAZOLE SODIUM 40 MG IV SOLR
40.0000 mg | INTRAVENOUS | Status: DC
  Administered 2013-01-11 (×2): 40 mg via INTRAVENOUS
  Filled 2013-01-11 (×3): qty 40

## 2013-01-11 NOTE — Progress Notes (Signed)
  Echocardiogram 2D Echocardiogram has been performed.  Sherisse Fullilove 01/11/2013, 11:23 AM

## 2013-01-11 NOTE — Evaluation (Signed)
Clinical/Bedside Swallow Evaluation Patient Details  Name: Bethany Cardenas MRN: [id number] Date of Birth: 01/27/1922  Today's Date: 01/11/2013 Time: 4098-1191 SLP Time Calculation (min): 20 min  Past Medical History:  Past Medical History  Diagnosis Date  . TOBACCO DEPENDENCE 03/12/2006    Qualifier: Diagnosis of  By: Abundio Miu    . CVA 03/12/2006    Qualifier: Diagnosis of  By: Abundio Miu    . Stroke   . Atrial fibrillation    Past Surgical History: History reviewed. No pertinent past surgical history. HPI:  79 yr old female with a history of afib, CVA, CAD, HLD, dementia, HTN, COPD presenting with a change in her voice and the sensation that something is caught in her throat.  # Hoarseness/voice change/dysphagia: patient with sudden onset of sensation of something caught in her throat yesterday with progressive hoarseness and voice change. Given history of CVA and afib not on anticoagulation (especially with chads-vasc score of 7) there is concern that this could be related to a CVA, though this may be an unlikely presentation given there are no other neuro changes other than voice change. Must also consider GI causes, such as reflux, stricture, esophageal cancer, and pulmonary tree issues such as a laryngeal cancer.  CXR No active disease. Cardiomegaly. Chronic pulmonary markings.    Assessment / Plan / Recommendation Clinical Impression  Pharyngeal deficits identified during evaluation include moderately decreased laryngeal elevation during reflexive swallows, audible swallow sometimes indicative of dysfunction and delayed cough.  She may also have esophageal components as well.  Recommend MBS to fully assess oropharyngeal swallow function and recommend safest po's.        Aspiration Risk  Moderate    Diet Recommendation NPO        Other  Recommendations Recommended Consults: MBS Oral Care Recommendations: Oral care BID   Follow Up Recommendations   (TBD)     Frequency and Duration        Pertinent Vitals/Pain WDL         Swallow Study        Oral/Motor/Sensory Function Overall Oral Motor/Sensory Function:  (mild general weakness)   Ice Chips Ice chips: Not tested   Thin Liquid Thin Liquid: Impaired Presentation: Cup;Spoon;Straw Oral Phase Impairments: Reduced labial seal Oral Phase Functional Implications: Right anterior spillage Pharyngeal  Phase Impairments: Decreased hyoid-laryngeal movement;Multiple swallows;Suspected delayed Swallow;Cough - Delayed (audible swallow)    Nectar Thick Nectar Thick Liquid: Not tested   Honey Thick Honey Thick Liquid: Not tested   Puree Puree: Impaired Pharyngeal Phase Impairments: Decreased hyoid-laryngeal movement;Multiple swallows (audible swallow)   Solid   GO    Solid: Not tested       Royce Macadamia M.Ed [employer] [phone]  01/11/2013

## 2013-01-11 NOTE — Progress Notes (Signed)
UR Completed Baraa Tubbs Graves-Bigelow, RN,BSN [phone]  

## 2013-01-11 NOTE — H&P (Addendum)
FMTS Attending  Note: Bethany Misch,MD I  have seen and examined this patient, reviewed their chart. I have discussed this patient with the resident. I agree with the resident's findings, assessment and care plan.  Patient presented to the ED for sudden onset of choking after eating chicken at home yesterday associated with voice change. I could not understand her today due to mumbling,her sister who was by her bedside stated she has problem with talking in the past but this is worse. She denies any limb weakness,no facial dropping,no fall or LOC.Denies coughing or SOB. No fever. She has hx of stroke about 20 yrs ago with no residual weakness.  Filed Vitals:   01/11/13 0250 01/11/13 0410 01/11/13 0610 01/11/13 0818  BP: 103/70 136/72 135/84 134/83  Pulse: 110 100 105 95  Temp:      TempSrc:      Resp: 17 17 16    Height:      Weight:      SpO2: 100% 100% 97%     Exam: Gen: Calm in bed not in distress,mumbling most of her words,voice does not seem slurry. Neuro: Awake and alert, oriented x 3. No facial dropping. No sensory loss, power across all joint about 4/5. DTR++. CN grossly intact. Gait not assessed. Resp: Air entry equal b/l,no abnormal sounds. CV: S1 S2 normal, no murmurs NFA:OZHYQM Ext: No edema.  A/P: 79 Y/O F with PMX of Afib, HTN,previous stroke with; 1. Hoarseness of voice and choking:     I agree with r/o stroke due to hx of Afib not on anticoagulant and hx of stroke.     R/O GI pathology,denies prior hx of colonoscopy or EGD.     Stroke work up in progress.     GI consult today for possible EGD.     PPI for GI prophylaxis.     Speech therapy and PT recommended.  2. Afib: Unclear why not on anticoagulant, (age vs hx of bleeding or bleeding risk).    Recommend ASA at the minimum.    I agree with cardiology assessment if necessary.  3. HTN: May restart home med if stroke ruled out.     Currently her BP looks ok.  Please review residents note for full  documentation.

## 2013-01-11 NOTE — Progress Notes (Signed)
Family Medicine Teaching Service Daily Progress Note Intern Pager: [phone]  Patient name: Bethany Cardenas Medical record number: [id number] Date of birth: Sep 02, 1922 Age: 79 y.o. Gender: female  Primary Care Provider: Felix Pacini, DO Consultants: GI Code Status: Full  Pt Overview and Major Events to Date:  12/30: barium swallow, GI consult  Assessment and Plan: Bethany Cardenas is a 79 y.o. female with a history of afib, CVA, CAD, HLD, dementia, HTN, COPD presenting with a change in her voice and the sensation that something is caught in her throat.   # Hoarseness/voice change/dysphagia: patient with sudden onset of sensation of something caught in her throat yesterday with progressive hoarseness and voice change. Given history of CVA and afib not on anticoagulation (especially with chads-vasc score of 7) there is concern that this could be related to a CVA, though this may be an unlikely presentation given there are no other neuro changes other than voice change. Must also consider GI causes, such as reflux, stricture, esophageal cancer, and pulmonary tree issues such as a laryngeal cancer.  -pending stroke work up-MRI/MRA, echo, carotid dopplers, lipid panel, A1c, monitor on tele, will increase ASA to 300 mg daily rectally  -will start on IV protonix for potential GI cause  -PT/OT to eval -SLP eval with pharyngeal deficits, will get MBS today -will consult GI to discuss patient and possible EGD  # A fib: rate increased to the 120's-130's in the ED. Patient without any symptoms with this.  -given failed swallow study will plan on rate control with metoprolol 5 mg IV q6 hours  -will monitor on tele  -repeat EKG in the am unchanged -continue metoprolol IV 5mg  q6hr  # HTN: normal range on admission.  -given concern for possible stroke will hold all anti-HTN medications with exception of metoprolol for rate control to allow for permissive HTN   # History of diastolic CHF: no recent echo  with last in 2006 without mention of diastolic dysfunction.  -will obtain repeat echo  -will hydrate gently given NPO status with D5 1/2 NS @ 75 mL/hr  -monitor fluid status with strict I/O's   # COPD: will continue home combivent   FEN/GI: NPO until passes speech eval, D5 1/2 NS 75 mL/hr  Prophylaxis: heparin SQ  Disposition: inpatient status, pending workup and clinical improvement  Subjective:  Pt still with hoarse voice today but she says she is better. No complaints of pain. No CP, no SOB, no difficulty breathing.   Objective: Temp:  [97.8 F (36.6 C)-98.3 F (36.8 C)] 97.8 F (36.6 C) (12/29 2046) Pulse Rate:  [91-140] 105 (12/30 0610) Resp:  [16-23] 16 (12/30 0610) BP: (103-169)/(65-93) 135/84 mmHg (12/30 0610) SpO2:  [95 %-100 %] 97 % (12/30 0610) Weight:  [129 lb 6.6 oz (58.7 kg)] 129 lb 6.6 oz (58.7 kg) (12/29 2046) Physical Exam: General: NAD, sitting upright in bed HEENT: MMM, no obvious obstruction but  Cardiovascular: irreg irreg, no murmurs appreciated Respiratory: CTAB, effort slightly reduced Abdomen: bowel sounds present, soft, NTTP Extremities: no edema or cyanosis Neuro: awake and alert, speech impaired. Questionable palate elevation but difficult to assess due to tongue obstructing view. CN2-12 otherwise grossly normal.  Laboratory:  Recent Labs Lab 01/10/13 1657 01/10/13 1833  WBC  --  6.8  HGB 15.3* 12.7  HCT 45.0 37.3  PLT  --  171    Recent Labs Lab 01/10/13 1657  NA 139  K 4.2  CL 103  BUN 13  CREATININE 0.70  GLUCOSE 114*    Imaging/Diagnostic Tests: Pending results for echo, carotid dopplers, MRI/MRA  Tawni Carnes, MD 01/11/2013, 7:19 AM PGY-1, [facility] Family Medicine FPTS Intern pager: [phone], text pages welcome

## 2013-01-11 NOTE — Progress Notes (Signed)
I have seen patient and discussed current hospitalization. I agree with the excellent care provided by the primary team of [facility] and want to thank them for their continued efforts in caring for my patient.   Ramaj Frangos DO 

## 2013-01-11 NOTE — Evaluation (Signed)
Physical Therapy Evaluation Patient Details Name: Bethany Cardenas MRN: [id number] DOB: 01/29/1922 Today's Date: 01/11/2013 Time: 0941-1009 PT Time Calculation (min): 28 min  PT Assessment / Plan / Recommendation History of Present Illness  Bethany Cardenas is a 79 y.o. female with a history of afib, CVA, CAD, HLD, dementia, HTN, COPD presenting with a change in her voice and the sensation that something is caught in her throat.  Likely aspiration of chicken stew.  Clinical Impression  Pt admitted with possible TIA/CVA due to aspiration of chicken stew. Pt currently with functional limitations due to the deficits listed below (see PT Problem List). Pt will benefit from skilled PT to increase their independence and safety with mobility to allow discharge to the venue listed below. Anticipate d/c home with HHPT and RW.     PT Assessment  Patient needs continued PT services    Follow Up Recommendations  Home health PT;Supervision/Assistance - 24 hour    Does the patient have the potential to tolerate intense rehabilitation      Barriers to Discharge Decreased caregiver support;Other (comment) pt. lives with older sister and therefore concerned about level of care provided at home.    Equipment Recommendations  Rolling walker with 5" wheels    Recommendations for Other Services     Frequency Min 4X/week    Precautions / Restrictions Precautions Precautions: Fall Restrictions Weight Bearing Restrictions: No   Pertinent Vitals/Pain Pt. Denied pain      Mobility  Bed Mobility Bed Mobility: Supine to Sit;Sit to Supine Supine to Sit: 5: Supervision;HOB elevated;With rails Sit to Supine: 3: Mod assist;HOB flat Details for Bed Mobility Assistance: increased difficulty with sit to supine Transfers Transfers: Sit to Stand;Stand to Sit Sit to Stand: 4: Min guard;From bed;From chair/3-in-1 Stand to Sit: 4: Min guard;To chair/3-in-1;To bed Details for Transfer Assistance: increased  time needed; no physical assistance needed Ambulation/Gait Ambulation/Gait Assistance: 4: Min guard Ambulation Distance (Feet): 90 Feet Assistive device: Rolling walker;None;1 person hand held assist Ambulation/Gait Assistance Details: no device x 7' however short shuffling steps taken therefore utilized RW for remainder of ambulation Gait Pattern: Step-to pattern;Within Functional Limits;Shuffle Gait velocity: decreased General Gait Details: improved step length with RW Stairs: No Wheelchair Mobility Wheelchair Mobility: No Modified Rankin (Stroke Patients Only) Pre-Morbid Rankin Score: Slight disability Modified Rankin: Slight disability    Exercises     PT Diagnosis: Difficulty walking;Abnormality of gait  PT Problem List: Decreased activity tolerance;Decreased balance;Decreased mobility;Decreased knowledge of use of DME;Decreased safety awareness;Decreased knowledge of precautions PT Treatment Interventions: DME instruction;Gait training;Stair training;Functional mobility training;Therapeutic activities;Therapeutic exercise;Balance training;Neuromuscular re-education;Patient/family education     PT Goals(Current goals can be found in the care plan section) Acute Rehab PT Goals Patient Stated Goal: to return home PT Goal Formulation: With patient/family Time For Goal Achievement: 01/18/13 Potential to Achieve Goals: Good  Visit Information  Last PT Received On: 01/11/13 Assistance Needed: +1 History of Present Illness: Bethany Cardenas is a 79 y.o. female with a history of afib, CVA, CAD, HLD, dementia, HTN, COPD presenting with a change in her voice and the sensation that something is caught in her throat.  Likely aspiration of chicken stew.       Prior Functioning  Home Living Family/patient expects to be discharged to:: Private residence Living Arrangements: Other relatives (sister) Available Help at Discharge: Family;Available 24 hours/day Type of Home: House Home  Access: Stairs to enter [employer] of Steps: 2 Entrance Stairs-Rails: None Home Layout: One level Home Equipment:  Cane - single point Prior Function Level of Independence: Independent Communication Communication: Expressive difficulties    Cognition  Cognition Arousal/Alertness: Awake/alert Behavior During Therapy: WFL for tasks assessed/performed Overall Cognitive Status: History of cognitive impairments - at baseline Memory: Decreased short-term memory    Extremity/Trunk Assessment Upper Extremity Assessment Upper Extremity Assessment: Defer to OT evaluation Lower Extremity Assessment Lower Extremity Assessment: Overall WFL for tasks assessed Cervical / Trunk Assessment Cervical / Trunk Assessment: Normal   Balance Balance Balance Assessed: Yes Static Sitting Balance Static Sitting - Balance Support: Feet supported;No upper extremity supported Static Sitting - Level of Assistance: 5: Stand by assistance Static Sitting - Comment/# of Minutes: 5 Static Standing Balance Static Standing - Balance Support: No upper extremity supported;During functional activity Static Standing - Level of Assistance: 4: Min assist Static Standing - Comment/# of Minutes: 3  End of Session PT - End of Session Equipment Utilized During Treatment: Gait belt Activity Tolerance: Patient tolerated treatment well Patient left: in bed;with nursing/sitter in room (transportation in room as pt. going to test) Nurse Communication: Mobility status  GP     Moshe Cipro K 01/11/2013, 10:26 AM  Clarita Crane, PT, DPT [phone]

## 2013-01-11 NOTE — Progress Notes (Signed)
OT Cancellation Note  Patient Details Name: Bethany Cardenas MRN: [id number] DOB: August 10, 1922   Cancelled Treatment:    Reason Eval/Treat Not Completed: Patient at procedure or test/ unavailable  Harolyn Rutherford Pager: [phone]  01/11/2013, 1:20 PM

## 2013-01-11 NOTE — Progress Notes (Signed)
FMTS Attending  Note: Kehinde Eniola,MD I  have seen and examined this patient, reviewed their chart. I have discussed this patient with the resident. I agree with the resident's findings, assessment and care plan.  

## 2013-01-11 NOTE — Procedures (Signed)
Objective Swallowing Evaluation: Modified Barium Swallowing Study  Patient Details  Name: Bethany Cardenas MRN: [id number] Date of Birth: 1922/09/24  Today's Date: 01/11/2013 Time: 1320-1340 SLP Time Calculation (min): 20 min  Past Medical History:  Past Medical History  Diagnosis Date  . TOBACCO DEPENDENCE 03/12/2006    Qualifier: Diagnosis of  By: Bethany Cardenas    . CVA 03/12/2006    Qualifier: Diagnosis of  By: Bethany Cardenas    . Stroke   . Atrial fibrillation    Past Surgical History: History reviewed. No pertinent past surgical history. HPI:  79 yr old female with a history of afib, CVA, CAD, HLD, dementia, HTN, COPD presenting with a change in her voice and the sensation that something is caught in her throat.  # Hoarseness/voice change/dysphagia: patient with sudden onset of sensation of something caught in her throat yesterday with progressive hoarseness and voice change. Given history of CVA and afib not on anticoagulation (especially with chads-vasc score of 7) there is concern that this could be related to a CVA, though this may be an unlikely presentation given there are no other neuro changes other than voice change. Must also consider GI causes, such as reflux, stricture, esophageal cancer, and pulmonary tree issues such as a laryngeal cancer.  CXR No active disease. Cardiomegaly. Chronic pulmonary markings.      Assessment / Plan / Recommendation Clinical Impression  Dysphagia Diagnosis: Mild oral phase dysphagia;Mild pharyngeal phase dysphagia Clinical impression: Pt. exhibited holding and delays of boluses possibly due to cognitive deficits.  Mild motor based pharyngeal dysphagia evident with thin via straw due to large sip and decreased control resulting in flash laryngeal penetration.  Small cup sips thin were safe during study and vallecular residue was mild.  Brief scan of esophagus (no radiologist present to confirm) revealed possible, minimal stasis (?).   Recommend Dys 3 diet texture and thin liquids, pills whole in applesauce, no straw, sit upright.  ST will follow up x 1.     Treatment Recommendation  Therapy as outlined in treatment plan below    Diet Recommendation Dysphagia 3 (Mechanical Soft);Thin liquid   Liquid Administration via: Cup;No straw Medication Administration: Whole meds with puree Supervision: Patient able to self feed;Intermittent supervision to cue for compensatory strategies Compensations: Slow rate;Small sips/bites Postural Changes and/or Swallow Maneuvers: Seated upright 90 degrees    Other  Recommendations Oral Care Recommendations: Oral care BID   Follow Up Recommendations  None    Frequency and Duration min 1 x/week  1 week   Pertinent Vitals/Pain WDl          Reason for Referral Objectively evaluate swallowing function   Oral Phase Oral Preparation/Oral Phase Oral Phase: Impaired Oral - Nectar Oral - Nectar Cup: Delayed oral transit;Holding of bolus Oral - Thin Oral - Thin Teaspoon: Delayed oral transit;Holding of bolus Oral - Thin Cup: Delayed oral transit;Holding of bolus Oral - Thin Straw: Delayed oral transit;Holding of bolus Oral - Solids Oral - Puree: Delayed oral transit;Holding of bolus Oral - Regular: Delayed oral transit   Pharyngeal Phase Pharyngeal Phase Pharyngeal Phase: Impaired Pharyngeal - Nectar Pharyngeal - Nectar Cup: Pharyngeal residue - valleculae;Reduced laryngeal elevation Pharyngeal - Thin Pharyngeal - Thin Teaspoon: Pharyngeal residue - valleculae;Reduced tongue base retraction Pharyngeal - Thin Cup: Pharyngeal residue - valleculae;Reduced tongue base retraction Pharyngeal - Thin Straw: Pharyngeal residue - valleculae;Reduced tongue base retraction;Penetration/Aspiration during swallow Penetration/Aspiration details (thin straw): Material enters airway, remains ABOVE vocal cords then ejected out Pharyngeal -  Solids Pharyngeal - Puree: Pharyngeal residue -  valleculae;Reduced tongue base retraction Pharyngeal - Regular: Pharyngeal residue - valleculae;Reduced tongue base retraction  Cervical Esophageal Phase    GO    Cervical Esophageal Phase Cervical Esophageal Phase: Bethany Cardenas         Bethany Cardenas.Ed [employer] [phone]  01/11/2013

## 2013-01-11 NOTE — Progress Notes (Signed)
VASCULAR LAB PRELIMINARY  PRELIMINARY  PRELIMINARY  PRELIMINARY  Carotid duplex completed.    Preliminary report:  Mild to moderate plaque disease noted throughout without significant ICA stenosis bilaterally.   Arick Mareno, RVT 01/11/2013, 10:57 AM

## 2013-01-12 DIAGNOSIS — IMO0002 Reserved for concepts with insufficient information to code with codable children: Secondary | ICD-10-CM | POA: Diagnosis not present

## 2013-01-12 DIAGNOSIS — R498 Other voice and resonance disorders: Secondary | ICD-10-CM | POA: Diagnosis not present

## 2013-01-12 DIAGNOSIS — T17308A Unspecified foreign body in larynx causing other injury, initial encounter: Secondary | ICD-10-CM | POA: Diagnosis not present

## 2013-01-12 DIAGNOSIS — F458 Other somatoform disorders: Secondary | ICD-10-CM | POA: Diagnosis not present

## 2013-01-12 DIAGNOSIS — R1310 Dysphagia, unspecified: Secondary | ICD-10-CM | POA: Diagnosis not present

## 2013-01-12 MED ORDER — SODIUM CHLORIDE 0.9 % IV SOLN
INTRAVENOUS | Status: DC
  Administered 2013-01-12: 10 mL via INTRAVENOUS

## 2013-01-12 MED ORDER — PANTOPRAZOLE SODIUM 40 MG PO TBEC
40.0000 mg | DELAYED_RELEASE_TABLET | Freq: Every day | ORAL | Status: DC
  Administered 2013-01-12 – 2013-01-13 (×2): 40 mg via ORAL
  Filled 2013-01-12 (×2): qty 1

## 2013-01-12 MED ORDER — ASPIRIN 81 MG PO CHEW
81.0000 mg | CHEWABLE_TABLET | Freq: Every day | ORAL | Status: DC
  Administered 2013-01-13 – 2013-01-14 (×2): 81 mg via ORAL
  Filled 2013-01-12 (×2): qty 1

## 2013-01-12 MED ORDER — ASPIRIN 325 MG PO TABS
325.0000 mg | ORAL_TABLET | Freq: Every day | ORAL | Status: DC
  Administered 2013-01-12: 325 mg via ORAL
  Filled 2013-01-12: qty 1

## 2013-01-12 NOTE — Progress Notes (Signed)
Physical Therapy Treatment Patient Details Name: Bethany Cardenas MRN: [id number] DOB: 08/03/1922 Today's Date: 01/12/2013 Time: 4098-1191 PT Time Calculation (min): 28 min  PT Assessment / Plan / Recommendation  History of Present Illness Bethany Cardenas is a 79 y.o. female with a history of afib, CVA, CAD, HLD, dementia, HTN, COPD presenting with a change in her voice and the sensation that something is caught in her throat.  Likely aspiration of chicken stew.   PT Comments   Pt generally unsteady and needs A with mobility for safety.  Pt's sister present for treatment and attempting to educate pt and sister on need for A and safety, however question retention.  Very concerned about pt and her sister living home alone.  Sister asking this PT if we were "sending someone out to cook" for them and when PT asked if sister needed info for meals on wheels she replied that "Oh so you don't cook, but you'll take care of [place]."  PT emphasized that we were not going to be cooking or taking care of pt and sister, however have recommended HHPT.  Pt's sister just smiled and stated "That's fine.  They'll take care of both of [place]."  Spoke with RNCM about this situation and need to see if family are involved in caring for pt and her sister otherwise will need to consider HHRN, Aide, and SW.  Pt's sister is to be pt's caregiver, however sister clearly has cognitive deficits as well.  Will continue to follow.    Follow Up Recommendations  Home health PT;Supervision/Assistance - 24 hour     Does the patient have the potential to tolerate intense rehabilitation     Barriers to Discharge        Equipment Recommendations  Rolling walker with 5" wheels    Recommendations for Other Services    Frequency Min 4X/week   Progress towards PT Goals Progress towards PT goals: Progressing toward goals  Plan Current plan remains appropriate    Precautions / Restrictions Precautions Precautions: Fall Restrictions Weight  Bearing Restrictions: No   Pertinent Vitals/Pain Denied pain.      Mobility  Bed Mobility Bed Mobility: Not assessed Transfers Transfers: Sit to Stand;Stand to Sit Sit to Stand: 4: Min guard;With upper extremity assist;From bed Stand to Sit: 4: Min guard;With upper extremity assist;To bed Details for Transfer Assistance: cues for UE use and needs increased time to complete.   Ambulation/Gait Ambulation/Gait Assistance: 4: Min guard Ambulation Distance (Feet): 180 Feet Assistive device: Rolling walker Ambulation/Gait Assistance Details: cues for use of RW, positioning within RW, safety.   Gait Pattern: Step-through pattern;Decreased stride length;Shuffle;Trunk flexed Stairs: Yes Stairs Assistance: 4: Min assist Stairs Assistance Details (indicate cue type and reason): cues for safety, use of one rail, and education for pt's sister on how to A pt and need for sister to carry RW up and down stairs for pt.   Stair Management Technique: One rail Right;Forwards;Step to pattern Number of Stairs: 5 Wheelchair Mobility Wheelchair Mobility: No Modified Rankin (Stroke Patients Only) Pre-Morbid Rankin Score: Slight disability Modified Rankin: Slight disability    Exercises     PT Diagnosis:    PT Problem List:   PT Treatment Interventions:     PT Goals (current goals can now be found in the care plan section) Acute Rehab PT Goals Patient Stated Goal: to return home Time For Goal Achievement: 01/18/13 Potential to Achieve Goals: Good  Visit Information  Last PT Received On: 01/12/13 Assistance Needed: +  1 History of Present Illness: Bethany Cardenas is a 79 y.o. female with a history of afib, CVA, CAD, HLD, dementia, HTN, COPD presenting with a change in her voice and the sensation that something is caught in her throat.  Likely aspiration of chicken stew.    Subjective Data  Patient Stated Goal: to return home   Cognition  Cognition Arousal/Alertness: Awake/alert Behavior During  Therapy: WFL for tasks assessed/performed Overall Cognitive Status: History of cognitive impairments - at baseline Memory: Decreased short-term memory    Balance  Balance Balance Assessed: Yes Static Standing Balance Static Standing - Balance Support: Bilateral upper extremity supported Static Standing - Level of Assistance: 5: Stand by assistance  End of Session PT - End of Session Equipment Utilized During Treatment: Gait belt Activity Tolerance: Patient tolerated treatment well Patient left: in bed;with call bell/phone within reach;with bed alarm set;with family/visitor present (sitting EOB) Nurse Communication: Mobility status   GP     Sunny Schlein, [place] [phone] 01/12/2013, 10:57 AM

## 2013-01-12 NOTE — Progress Notes (Signed)
Family Medicine Teaching Service Daily Progress Note Intern Pager: [phone]  Patient name: Bethany Cardenas Medical record number: [id number] Date of birth: 10-Jun-1922 Age: 79 y.o. Gender: female  Primary Care Provider: Felix Pacini, DO Consultants: GI Code Status: Full  Pt Overview and Major Events to Date:  12/30: MBS, CVA r/o 12/31: GI consult to see today  Assessment and Plan: Bethany Cardenas is a 79 y.o. female with a history of afib, CVA, CAD, HLD, dementia, HTN, COPD presenting with a change in her voice and the sensation that something is caught in her throat.   # Rule out CVA:  -MRI/MRA: no acute infarct, carotid duplex without significant ICA stenosis bilaterally, echo negative for cardiac origin. -PT rec: Home health PT -OT rec: pending -Risk factor stratification:   A1c: 6.4  Lipids: wnl (LDL 65) -Given CVA ruled out, will return to ASA 81mg  chewable tablet  # Dysphagia: patient with sudden onset of sensation of something caught in her throat yesterday with progressive hoarseness and voice change. DDx: reflux, stricture, esophageal cancer, and pulmonary tree issues such as a laryngeal cancer.   -will start on IV protonix for potential GI cause  -SLP recommends dysphagia 3 diet (mechanical soft) after MBS 12/30 -Consult GI to discuss patient and possible EGD  # A fib: rate increased to the 120's-130's in the ED. Patient without any symptoms with this.  -Rate control: Change metoprolol IV 5mg  q6hr > metoprolol 50mg  po BID -will monitor on tele  -repeat EKG in the am unchanged  # HTN: normal range on admission.   -Continuing metoprolol for rate control -Not continuing home meds due to normotension: lasix 40mg  daily, HCTZ 25mg  daily, norvasc 10mg  daily, metoprolol 50mg  BID.  # History of diastolic CHF: no recent echo with last in 2006 without mention of diastolic dysfunction.  -Repeat echo with preserved EF (55-60%) and inadequate study to assess diastolic function.   -monitor fluid status, KVO fluids  # COPD: will continue home combivent   FEN/GI: Dysphagia 3 diet, KVO Prophylaxis: heparin SQ  Disposition: Pending GI recommendations, CM to arrange home health  Subjective:  Pt and sisters report improvement in swallowing. No complaints of pain. Still occasionally hoarse. No CP/SOB/N/V/D  Objective: Temp:  [99 F (37.2 C)-99.5 F (37.5 C)] 99.5 F (37.5 C) (12/30 2034) Pulse Rate:  [95-109] 99 (12/30 2034) BP: (115-139)/(56-84) 115/56 mmHg (12/30 2034) SpO2:  [96 %-98 %] 98 % (12/30 2034) Physical Exam: General: NAD, sitting upright in bed HEENT: MMM, no obvious obstruction but  Cardiovascular: irreg irreg, no murmurs appreciated Respiratory: CTAB, effort slightly reduced Abdomen: bowel sounds present, soft, NTTP Extremities: no edema or cyanosis Neuro: awake, alert and oriented, speech impaired (baseline), CN II-XII grossly intact, diffusely weak.   Laboratory:  Recent Labs Lab 01/10/13 1657 01/10/13 1833 01/11/13 1429  WBC  --  6.8 6.0  HGB 15.3* 12.7 14.6  HCT 45.0 37.3 41.8  PLT  --  171 158    Recent Labs Lab 01/10/13 1657 01/11/13 1429  NA 139 139  K 4.2 4.0  CL 103 101  CO2  --  23  BUN 13 8  CREATININE 0.70 0.72  CALCIUM  --  9.3  GLUCOSE 114* 110*    Imaging/Diagnostic Tests:  MRI HEAD WITHOUT CONTRAST  MRA HEAD WITHOUT CONTRAST  TECHNIQUE:  Multiplanar, multiecho pulse sequences of the brain and surrounding  structures were obtained without intravenous contrast. Angiographic  images of the head were obtained using MRA technique without  contrast.  COMPARISON: 01/10/2013 CT. No comparison MR.  FINDINGS:  MRI HEAD FINDINGS  Exam is motion degraded.  No acute infarct.  Remote infarct right basal ganglia, right operculum region and right  centrum semiovale with subsequent dilation of the right lateral  ventricle. Wallerian degeneration.  Remote small left cerebellar infarct.  Prominent mineral  deposition globus pallidus without evidence of  intracranial hemorrhage.  Mild small vessel disease type changes.  Global atrophy without hydrocephalus.  No intracranial mass lesion noted on this unenhanced exam.  Mild cervical kyphosis centered at the C5 level mild spinal  stenosis.  Right cerebellar tonsils slightly low lying without a pointed  appearance. Pituitary region, pineal region and orbital structures  unremarkable.  Partial opacification inferior aspect mastoid air cells bilaterally.  Minimal paranasal sinus mucosal thickening.  MRA HEAD FINDINGS  Exam is motion degraded.  Moderate to marked narrowing of the pre cavernous segment of the  internal carotid artery bilaterally.  Irregularity with ectasia and narrowing involving the cavernous  segment of the internal carotid artery bilaterally.  Moderate to marked narrowing of the supraclinoid segment of the  internal carotid artery bilaterally with narrowing more notable on  the right.  Abrupt loss of signal proximal M1 segment right middle cerebral  artery with non visualized right middle cerebral artery branches  consistent with patient's remote infarct.  Moderate to marked tandem stenosis A1 segment of the left anterior  cerebral artery. Moderate tandem stenosis A1 segment right anterior  cerebral artery and involving the A2 segment of the middle cerebral  artery bilaterally.  Moderate tandem stenosis of the M1 segment of the left middle  cerebral artery most notable distal M1 segment. Moderate narrowing  of portions of the left middle cerebral artery branches.  Mild to moderate narrowing distal vertebral arteries more notable on  the right. Motion artifact may partially contribute to this finding.  Moderate narrowing proximal to mid aspect of the basilar artery.  Poor delineation of the right posterior inferior cerebellar artery  and the anterior inferior cerebellar arteries bilaterally.  Mild to moderate narrowing of  portions of the superior cerebellar  arteries more notable on the left.  Mild to moderate narrowing involving portions of the posterior  cerebral arteries bilaterally.  No up aneurysm detected.  IMPRESSION:  No acute infarct.  Remote infarcts and prominent intracranial atherosclerotic type  changes as detailed above.  Please see above for additional findings.  Echo:  - Left ventricle: The cavity size was normal. There was moderate concentric hypertrophy. Systolic function was normal. The estimated ejection fraction was in the range of 55% to 60%. The study is not technically sufficient to allow evaluation of LV diastolic function. - Aortic valve: Trileaflet. Sclerosis without stenosis. No regurgitation. - Mitral valve: Calcified annulus. There is tethering of the posterior leaflet and prolapse of the anterior leaflet at end-systole. Mild posteriorly directed mitral regurgitation. - Left atrium: The atrium was mildly dilated (area 21 cm2). - Tricuspid valve: Mildly thickened leaflets. Moderate regurgitation. - Pulmonary arteries: PA peak pressure: 48mm Hg (S). - Inferior vena cava: The vessel was normal in size; the respirophasic diameter changes were in the normal range (= 50%); findings are consistent with normal central venous pressure. - Pericardium, extracardiac: There was no pericardial effusion.  Carotid duplex:  A vascular evaluation was performed with the patient in the supine position. Image quality was good. The right CCA, right ECA, right ICA, right vertebral, left CCA, left ECA, left ICA, and left vertebral arteries were  examined. Carotid duplex study. Carotid Duplex exam including 2D imaging, color and spectral Doppler were performed on the extracranial carotid and vertebral arteries using standard established protocols. Location: Vascular laboratory. Patient status: Inpatient.  Hazeline Junker, MD 01/12/2013, 6:54 AM PGY-1, [facility] Family Medicine FPTS  Intern pager: [phone], text pages welcome

## 2013-01-12 NOTE — Evaluation (Signed)
Occupational Therapy Evaluation Patient Details Name: Bethany Cardenas MRN: [id number] DOB: 1922/10/06 Today's Date: 01/12/2013 Time: 4098-1191 OT Time Calculation (min): 16 min  OT Assessment / Plan / Recommendation History of present illness Bethany Cardenas is a 79 y.o. female with a history of afib, CVA, CAD, HLD, dementia, HTN, COPD presenting with a change in her voice and the sensation that something is caught in her throat.  Likely aspiration of chicken stew.   Clinical Impression   PT admitted with aspiration of chicken stew. Pt currently with functional limitiations due to the deficits listed below (see OT problem list).  Pt will benefit from skilled OT to increase their independence and safety with adls and balance to allow discharge HHOT. Question caregiver support upon d//c.      OT Assessment  Patient needs continued OT Services    Follow Up Recommendations  Home health OT;Other (comment) (aid, meals on wheels, nurse)    Barriers to Discharge      Equipment Recommendations  Other (comment) (RW)    Recommendations for Other Services    Frequency  Min 2X/week    Precautions / Restrictions Precautions Precautions: Fall   Pertinent Vitals/Pain HR 121     ADL  Toilet Transfer: Min Pension scheme manager Method: Sit to Barista: Regular height toilet Transfers/Ambulation Related to ADLs: pt walking RW into objects on the left side several times this session. pt able to self correct and move rw without (A). pt walking slight behind RW and needing cues to stay within rw. Pt abandoned rw once during session ADL Comments: Pt supien on arrival and very pleasant. pt agreeable to mobility with therapist. Pt will have the (A) of her sister only at home. Friend present only provides rides. Friend present asking questions and therapist refering to patient and to ask the MD. OT did not answer questions that were hippa related since friend is not a caregiver or  POA. Pt reports to friend "i am going home today." Pt currently without discharge summary. Pt wiht incr HR 121 max during brief short ambulation. Pt unaware of HR incr.     OT Diagnosis: Generalized weakness  OT Problem List: Decreased strength;Decreased activity tolerance;Impaired balance (sitting and/or standing);Decreased cognition;Decreased safety awareness;Decreased knowledge of use of DME or AE;Decreased knowledge of precautions OT Treatment Interventions: Self-care/ADL training;Therapeutic exercise;DME and/or AE instruction;Therapeutic activities;Patient/family education;Balance training;Cognitive remediation/compensation   OT Goals(Current goals can be found in the care plan section) Acute Rehab OT Goals Patient Stated Goal: to return home OT Goal Formulation: With patient Time For Goal Achievement: 01/26/13 Potential to Achieve Goals: Good  Visit Information  Last OT Received On: 01/12/13 Assistance Needed: +1 History of Present Illness: Bethany Cardenas is a 79 y.o. female with a history of afib, CVA, CAD, HLD, dementia, HTN, COPD presenting with a change in her voice and the sensation that something is caught in her throat.  Likely aspiration of chicken stew.       Prior Functioning     Home Living Family/patient expects to be discharged to:: Private residence Living Arrangements: Other relatives Available Help at Discharge: Family;Available 24 hours/day Type of Home: House Home Access: Stairs to enter [employer] of Steps: 2 Entrance Stairs-Rails: None Home Layout: One level Home Equipment: Cane - single point Additional Comments: per family friend present- PT and sister are each others caregivers. "Granddaughter is not well and great granddaughter helps out some when she can"  The friend present helps drive patient  and elder sister to appointments. Pt s friend states that patient is walking "NOrmal" nd when asked further questions reports "well no she doesnt  use a walker" Pt requries use of RW at this time for stability Prior Function Level of Independence: Independent Communication Communication: Expressive difficulties Dominant Hand: Right         Vision/Perception     Cognition  Cognition Arousal/Alertness: Awake/alert Behavior During Therapy: WFL for tasks assessed/performed Overall Cognitive Status: History of cognitive impairments - at baseline Memory: Decreased short-term memory    Extremity/Trunk Assessment Upper Extremity Assessment Upper Extremity Assessment: Overall WFL for tasks assessed Lower Extremity Assessment Lower Extremity Assessment: Defer to PT evaluation Cervical / Trunk Assessment Cervical / Trunk Assessment: Normal     Mobility Bed Mobility Bed Mobility: Supine to Sit;Sitting - Scoot to Edge of Bed;Sit to Supine Supine to Sit: 5: Supervision;HOB flat Sitting - Scoot to Edge of Bed: 5: Supervision Sit to Supine: 4: Min guard Details for Bed Mobility Assistance: t with incr time need to complete sit<>supine. Pt progressing quickly from bed surface to EOB Transfers Transfers: Sit to Stand;Stand to Sit Sit to Stand: 4: Min guard;With upper extremity assist;From bed Stand to Sit: 4: Min guard;With upper extremity assist;To bed Details for Transfer Assistance: cues to keep RW with patient and not abandon the RW     Exercise     Balance     End of Session OT - End of Session Activity Tolerance: Patient tolerated treatment well Patient left: in bed;with call bell/phone within reach;with bed alarm set Nurse Communication: Mobility status;Precautions  GO     Harolyn Rutherford 01/12/2013, 3:46 PM Pager: [phone]

## 2013-01-12 NOTE — Progress Notes (Signed)
Speech Language Pathology Treatment: Dysphagia  Patient Details Name: Bethany Cardenas MRN: [id number] DOB: 09-Sep-1922 Today's Date: 01/12/2013 Time: 4098-1191 SLP Time Calculation (min): 8 min  Assessment / Plan / Recommendation Clinical Impression  Pt. Seen for dysphagia treatment.  She appears brighter, stronger this am with sister and niece present.  No indications of aspiration during observation of recommended po's.  Additional therapeutic intervention included review of swallow precautions with pt./family with clinical rationale.  Recommend upgrade to regular and continue thin with swallow precautions.   HPI HPI: 79 yr old female with a history of afib, CVA, CAD, HLD, dementia, HTN, COPD presenting with a change in her voice and the sensation that something is caught in her throat.  # Hoarseness/voice change/dysphagia: patient with sudden onset of sensation of something caught in her throat yesterday with progressive hoarseness and voice change. Given history of CVA and afib not on anticoagulation (especially with chads-vasc score of 7) there is concern that this could be related to a CVA, though this may be an unlikely presentation given there are no other neuro changes other than voice change. Must also consider GI causes, such as reflux, stricture, esophageal cancer, and pulmonary tree issues such as a laryngeal cancer.  CXR No active disease. Cardiomegaly. Chronic pulmonary markings.    Pertinent Vitals WDL  SLP Plan  All goals met    Recommendations Diet recommendations: Regular;Thin liquid Liquids provided via: Cup;No straw Medication Administration: Whole meds with puree Supervision: Patient able to self feed;Intermittent supervision to cue for compensatory strategies Compensations: Slow rate;Small sips/bites Postural Changes and/or Swallow Maneuvers: Seated upright 90 degrees              Oral Care Recommendations: Oral care BID Follow up Recommendations: None Plan: All  goals met    GO     Royce Macadamia M.Ed [employer] [phone]  01/12/2013

## 2013-01-12 NOTE — Care Management Note (Signed)
    Page 1 of 2   01/12/2013     12:49:05 PM   CARE MANAGEMENT NOTE 01/12/2013  Patient:  Bethany Cardenas,Bethany Cardenas   Account Number:  [id number]  Date Initiated:  01/12/2013  Documentation initiated by:  GRAVES-BIGELOW,Yitzchok Carriger  Subjective/Objective Assessment:   Pt presenting with a change in her voice and the sensation that something is caught in her throat. Pt is from home with sister and sister will provide 24 hr supervision.     Action/Plan:   CM did offer the option of SNF and family is refusing SNF at this time. Family agreeable to [facility] services and SOC to begin within 24-48 hours post d/c. Referral made with [facility].   Anticipated DC Date:  01/13/2013   Anticipated DC Plan:  HOME W HOME HEALTH SERVICES      DC Planning Services  CM consult      PAC Choice  DURABLE MEDICAL EQUIPMENT  HOME HEALTH   Choice offered to / List presented to:  C-4 Adult Children   DME arranged  3-N-1  Levan Hurst      DME agency  [facility]     [facility] arranged  HH-1 RN  HH-10 DISEASE MANAGEMENT  HH-2 PT  HH-4 NURSE'S AIDE  HH-6 SOCIAL WORKER      HH agency  [facility]   Status of service:  Completed, signed off Medicare Important Message given?   (If response is "NO", the following Medicare IM given date fields will be blank) Date Medicare IM given:   Date Additional Medicare IM given:    Discharge Disposition:  HOME W HOME HEALTH SERVICES  Per UR Regulation:  Reviewed for med. necessity/level of care/duration of stay  If discussed at Long Length of Stay Meetings, dates discussed:    Comments:

## 2013-01-12 NOTE — Progress Notes (Signed)
FMTS Attending  Note: Destaney Sarkis,MD I  have seen and examined this patient, reviewed their chart. I have discussed this patient with the resident. I agree with the resident's findings, assessment and care plan.  

## 2013-01-12 NOTE — Consult Note (Signed)
Unassigned Patient  Reason for Consult: Dysphagia Referring Physician: Teaching Service  Bethany Cardenas HPI: This is an 79 year old female admitted for an acute onset of dysphagia and dysphonia.  She has a history of a CVA in the past.  No prior EGD.  She feels that there is something that is stuck in her throat.  A CT scan of the neck was negative for any masses or other overt abnormalities.  No esophagram.  She is able to tolerate liquids and soft foods, but she states that she cannot swallow solid chicken and other similar density foods.  As a result of her symptoms a GI consultation was requested.  Speech Pathology has evaluated the patient and there is no evidence of aspiration risk.  Past Medical History  Diagnosis Date  . TOBACCO DEPENDENCE 03/12/2006    Qualifier: Diagnosis of  By: Abundio Miu    . CVA 03/12/2006    Qualifier: Diagnosis of  By: Abundio Miu    . Stroke   . Atrial fibrillation     History reviewed. No pertinent past surgical history.  No family history on file.  Social History:  reports that she has quit smoking. She has never used smokeless tobacco. She reports that she does not drink alcohol or use illicit drugs.  Allergies: No Known Allergies  Medications:  Scheduled: . aspirin  81 mg Oral Daily  . heparin subcutaneous  5,000 Units Subcutaneous Q8H  . metoprolol  5 mg Intravenous Q6H  . pantoprazole  40 mg Oral QHS   Continuous: . sodium chloride      No results found for this or any previous visit (from the past 24 hour(s)).   Dg Chest 2 View  01/11/2013   CLINICAL DATA:  Stroke.  EXAM: CHEST  2 VIEW  COMPARISON:  01/09/2013  FINDINGS: Artifact overlies the chest. The heart is enlarged. Lungs show mild scarring with no evidence of infiltrate, collapse or fusion. No significant bony finding.  IMPRESSION: No active disease.  Cardiomegaly.  Chronic pulmonary markings.   Electronically Signed   By: Paulina Fusi M.D.   On: 01/11/2013 07:55    Ct Head Wo Contrast  01/10/2013   CLINICAL DATA:  Pt with hx of stroke. Pt's daughter states that pt has hx of CVA and has to be careful with swallowing. Pt's daughter states she thinks pt aspirated some food. Pt with sore throat and voice is hoarse. Pt's states she feels like something is sticking in the middle of her throat.  EXAM: CT HEAD WITHOUT CONTRAST  TECHNIQUE: Contiguous axial images were obtained from the base of the skull through the vertex without intravenous contrast.  COMPARISON:  08/12/2009  FINDINGS: There is no evidence of mass effect, midline shift, or extra-axial fluid collections. There is no evidence of a space-occupying lesion or intracranial hemorrhage. There is no evidence of a cortical-based area of acute infarction. There is an old right basal ganglia infarct with encephalomalacia. There are bilateral nonspecific basal ganglia calcifications. There is generalized cerebral atrophy. There is periventricular white matter low attenuation likely secondary to microangiopathy.  The ventricles and sulci are appropriate for the patient's age. The basal cisterns are patent.  Visualized portions of the orbits are unremarkable. The visualized portions of the paranasal sinuses and mastoid air cells are unremarkable. Cerebrovascular atherosclerotic calcifications are noted.  The osseous structures are unremarkable.  IMPRESSION: No acute intracranial pathology.   Electronically Signed   By: Elige Ko   On: 01/10/2013 19:35  Ct Soft Tissue Neck Wo Contrast  01/10/2013   CLINICAL DATA:  Question aspiration. Patient with sore throat, hoarse voice, feels like something is thickening and mental of current  EXAM: CT NECK WITHOUT CONTRAST  TECHNIQUE: Multidetector CT imaging of the neck was performed following the standard protocol without intravenous contrast.  COMPARISON:  Prior CT from 08/12/2009  FINDINGS: The visualized portions of the brain are unremarkable.  The visualized orbits are  grossly normal. The paranasal sinuses are clear. No mastoid effusion within the partially visualized mastoid air cells.  The salivary glands including the parotid glands and submandibular glands demonstrate a normal appearance and are symmetric in size bilaterally.  The oral cavity is within normal limits. Oropharynx is clear. The parapharyngeal fat is preserved bilaterally. There is asymmetric fullness within the right posterior nasopharynx and fossa of Rosenmuller (series 2, image 27). No definite discrete mass lesion identified on this noncontrast CT.  Evaluation of the airway is somewhat limited by motion artifact. The vallecula is grossly clear. Epiglottis is normal. The hypopharynx and supraglottic larynx are within normal limits. True vocal cords are symmetric bilaterally. Subglottic airway is clear. Esophagus is within normal limits.  No pathologically enlarged cervical lymph nodes are identified. No adenopathy seen within the visualized superior mediastinum. No mass lesion or loculated fluid collection identified within the neck.  Thyroid gland is within normal limits.  Atherosclerotic calcifications noted about the carotid bifurcations bilaterally.  The visualized portions of the upper lobes are clear.  Multilevel degenerative changes noted within the visualized spine. There is chronic grade 1 anterolisthesis of C3 on C4 and C4 on C5. Moderate degenerative disc disease as evidenced by intervertebral disc space narrowing, endplate sclerosis, and endplate osteophytosis is seen at C5-6 and C6-7. No focal lytic or blastic osseous lesions identified.  IMPRESSION: 1. Asymmetric fullness within the right posterior nasopharynx without definite discrete mass lesion. This finding is indeterminate, and may be partially related to patient positioning. Correlation with direct visualization for further evaluation could be performed as clinically indicated. 2. Otherwise unremarkable noncontrast CT of the neck without  evidence of mass lesion, adenopathy, or loculated fluid collection.   Electronically Signed   By: Rise Mu M.D.   On: 01/10/2013 17:46   Ct Chest Wo Contrast  01/10/2013   CLINICAL DATA:  Sore throat. , course, choking, evaluate for foreign body.  EXAM: CT CHEST WITHOUT CONTRAST  TECHNIQUE: Multidetector CT imaging of the chest was performed following the standard protocol without IV contrast.  COMPARISON:  None.  FINDINGS: Noncontrast evaluation of thoracic inlet is unremarkable.  There is no evidence of radiopaque foreign bodies projecting in the the lumen of the esophagus or along the course of the esophagus. There is not appear to be significant esophageal wall thickening. There is no evidence of esophageal dilation.  Multichamber cardiac enlargement is appreciated. Atherosclerotic calcifications in the aorta and coronary arteries are identified. Within the limitations of a noncontrast CT there is no evidence of pathologic sized adenopathy nor masses within the mediastinum or hilar regions. The lung parenchyma demonstrates no focal reason consolidation. Mild areas of interstitial prominence identified within the lung bases mild focal regions of consolidation. These findings appear to be of greatest confluence in the right infrahilar region. There is a component of mild subpleural septal thickening in the right lung base. A vague focus of ground-glass density projects within the posterior aspect of the lateral segment right middle lobe image number 25 series 3 measuring approximately 1.2 x 1 cm.  Visualized upper abdominal viscera demonstrate no gross abnormalities. Atherosclerotic calcifications are appreciated within the abdominal aorta and mesenteric vessels.  IMPRESSION: 1. Nonspecific vague finding within the lateral segment right middle lobe differential considerations are scarring versus posterior early or mild infiltrate. Surveillance evaluation of this finding and 3-6 months is  recommended. 2. Mild interstitial findings within the lung bases right greater than left. 3. No CT evidence of a radiopaque foreign body within the lumen of the esophagus nor along the course of the esophagus. 4. Cardiomegaly 5. Atherosclerotic disease   Electronically Signed   By: Salome Holmes M.D.   On: 01/10/2013 17:34   Mr Brain Wo Contrast  01/11/2013   CLINICAL DATA:  History of atrial fibrillation, hyperlipidemia and prior infarct. Hypertension in dementia. Presenting with change in voice and sensation of something caught in throat.  EXAM: MRI HEAD WITHOUT CONTRAST  MRA HEAD WITHOUT CONTRAST  TECHNIQUE: Multiplanar, multiecho pulse sequences of the brain and surrounding structures were obtained without intravenous contrast. Angiographic images of the head were obtained using MRA technique without contrast.  COMPARISON:  01/10/2013 CT.  No comparison MR.  FINDINGS: MRI HEAD FINDINGS  Exam is motion degraded.  No acute infarct.  Remote infarct right basal ganglia, right operculum region and right centrum semiovale with subsequent dilation of the right lateral ventricle. Wallerian degeneration.  Remote small left cerebellar infarct.  Prominent mineral deposition globus pallidus without evidence of intracranial hemorrhage.  Mild small vessel disease type changes.  Global atrophy without hydrocephalus.  No intracranial mass lesion noted on this unenhanced exam.  Mild cervical kyphosis centered at the C5 level mild spinal stenosis.  Right cerebellar tonsils slightly low lying without a pointed appearance. Pituitary region, pineal region and orbital structures unremarkable.  Partial opacification inferior aspect mastoid air cells bilaterally. Minimal paranasal sinus mucosal thickening.  MRA HEAD FINDINGS  Exam is motion degraded.  Moderate to marked narrowing of the pre cavernous segment of the internal carotid artery bilaterally.  Irregularity with ectasia and narrowing involving the cavernous segment of the  internal carotid artery bilaterally.  Moderate to marked narrowing of the supraclinoid segment of the internal carotid artery bilaterally with narrowing more notable on the right.  Abrupt loss of signal proximal M1 segment right middle cerebral artery with non visualized right middle cerebral artery branches consistent with patient's remote infarct.  Moderate to marked tandem stenosis A1 segment of the left anterior cerebral artery. Moderate tandem stenosis A1 segment right anterior cerebral artery and involving the A2 segment of the middle cerebral artery bilaterally.  Moderate tandem stenosis of the M1 segment of the left middle cerebral artery most notable distal M1 segment. Moderate narrowing of portions of the left middle cerebral artery branches.  Mild to moderate narrowing distal vertebral arteries more notable on the right. Motion artifact may partially contribute to this finding.  Moderate narrowing proximal to mid aspect of the basilar artery.  Poor delineation of the right posterior inferior cerebellar artery and the anterior inferior cerebellar arteries bilaterally.  Mild to moderate narrowing of portions of the superior cerebellar arteries more notable on the left.  Mild to moderate narrowing involving portions of the posterior cerebral arteries bilaterally.  No up aneurysm detected.  IMPRESSION: No acute infarct.  Remote infarcts and prominent intracranial atherosclerotic type changes as detailed above.  Please see above for additional findings.   Electronically Signed   By: Bridgett Larsson M.D.   On: 01/11/2013 15:10   Dg Swallowing Func-speech Pathology  01/11/2013  Breck Coons [place], CCC-SLP     01/11/2013  2:46 PM Objective Swallowing Evaluation: Modified Barium Swallowing Study   Patient Details  Name: Bethany Cardenas MRN: [id number] Date of Birth: 1922-01-29  Today's Date: 01/11/2013 Time: 1320-1340 SLP Time Calculation (min): 20 min  Past Medical History:  Past Medical History  Diagnosis Date   . TOBACCO DEPENDENCE 03/12/2006    Qualifier: Diagnosis of  By: Abundio Miu    . CVA 03/12/2006    Qualifier: Diagnosis of  By: Abundio Miu    . Stroke   . Atrial fibrillation    Past Surgical History: History reviewed. No pertinent past  surgical history. HPI:  79 yr old female with a history of afib, CVA, CAD, HLD, dementia,  HTN, COPD presenting with a change in her voice and the sensation  that something is caught in her throat.  # Hoarseness/voice  change/dysphagia: patient with sudden onset of sensation of  something caught in her throat yesterday with progressive  hoarseness and voice change. Given history of CVA and afib not on  anticoagulation (especially with chads-vasc score of 7) there is  concern that this could be related to a CVA, though this may be  an unlikely presentation given there are no other neuro changes  other than voice change. Must also consider GI causes, such as  reflux, stricture, esophageal cancer, and pulmonary tree issues  such as a laryngeal cancer.  CXR No active disease. Cardiomegaly.  Chronic pulmonary markings.      Assessment / Plan / Recommendation Clinical Impression  Dysphagia Diagnosis: Mild oral phase dysphagia;Mild pharyngeal  phase dysphagia Clinical impression: Pt. exhibited holding and delays of boluses  possibly due to cognitive deficits.  Mild motor based pharyngeal  dysphagia evident with thin via straw due to large sip and  decreased control resulting in flash laryngeal penetration.   Small cup sips thin were safe during study and vallecular residue  was mild.  Brief scan of esophagus (no radiologist present to  confirm) revealed possible, minimal stasis (?).  Recommend Dys 3  diet texture and thin liquids, pills whole in applesauce, no  straw, sit upright.  ST will follow up x 1.     Treatment Recommendation  Therapy as outlined in treatment plan below    Diet Recommendation Dysphagia 3 (Mechanical Soft);Thin liquid   Liquid Administration via: Cup;No  straw Medication Administration: Whole meds with puree Supervision: Patient able to self feed;Intermittent supervision  to cue for compensatory strategies Compensations: Slow rate;Small sips/bites Postural Changes and/or Swallow Maneuvers: Seated upright 90  degrees    Other  Recommendations Oral Care Recommendations: Oral care BID   Follow Up Recommendations  None    Frequency and Duration min 1 x/week  1 week   Pertinent Vitals/Pain WDl          Reason for Referral Objectively evaluate swallowing function   Oral Phase Oral Preparation/Oral Phase Oral Phase: Impaired Oral - Nectar Oral - Nectar Cup: Delayed oral transit;Holding of bolus Oral - Thin Oral - Thin Teaspoon: Delayed oral transit;Holding of bolus Oral - Thin Cup: Delayed oral transit;Holding of bolus Oral - Thin Straw: Delayed oral transit;Holding of bolus Oral - Solids Oral - Puree: Delayed oral transit;Holding of bolus Oral - Regular: Delayed oral transit   Pharyngeal Phase Pharyngeal Phase Pharyngeal Phase: Impaired Pharyngeal - Nectar Pharyngeal - Nectar Cup: Pharyngeal residue - valleculae;Reduced  laryngeal elevation Pharyngeal - Thin Pharyngeal - Thin Teaspoon: Pharyngeal residue -  valleculae;Reduced tongue base retraction  Pharyngeal - Thin Cup: Pharyngeal residue - valleculae;Reduced  tongue base retraction Pharyngeal - Thin Straw: Pharyngeal residue - valleculae;Reduced  tongue base retraction;Penetration/Aspiration during swallow Penetration/Aspiration details (thin straw): Material enters  airway, remains ABOVE vocal cords then ejected out Pharyngeal - Solids Pharyngeal - Puree: Pharyngeal residue - valleculae;Reduced  tongue base retraction Pharyngeal - Regular: Pharyngeal residue - valleculae;Reduced  tongue base retraction  Cervical Esophageal Phase    GO    Cervical Esophageal Phase Cervical Esophageal Phase: [name]         Darrow Bussing.Ed CCC-SLP Pager [phone]  01/11/2013    Mr Maxine Glenn Head/brain Wo Cm  01/11/2013   CLINICAL DATA:   History of atrial fibrillation, hyperlipidemia and prior infarct. Hypertension in dementia. Presenting with change in voice and sensation of something caught in throat.  EXAM: MRI HEAD WITHOUT CONTRAST  MRA HEAD WITHOUT CONTRAST  TECHNIQUE: Multiplanar, multiecho pulse sequences of the brain and surrounding structures were obtained without intravenous contrast. Angiographic images of the head were obtained using MRA technique without contrast.  COMPARISON:  01/10/2013 CT.  No comparison MR.  FINDINGS: MRI HEAD FINDINGS  Exam is motion degraded.  No acute infarct.  Remote infarct right basal ganglia, right operculum region and right centrum semiovale with subsequent dilation of the right lateral ventricle. Wallerian degeneration.  Remote small left cerebellar infarct.  Prominent mineral deposition globus pallidus without evidence of intracranial hemorrhage.  Mild small vessel disease type changes.  Global atrophy without hydrocephalus.  No intracranial mass lesion noted on this unenhanced exam.  Mild cervical kyphosis centered at the C5 level mild spinal stenosis.  Right cerebellar tonsils slightly low lying without a pointed appearance. Pituitary region, pineal region and orbital structures unremarkable.  Partial opacification inferior aspect mastoid air cells bilaterally. Minimal paranasal sinus mucosal thickening.  MRA HEAD FINDINGS  Exam is motion degraded.  Moderate to marked narrowing of the pre cavernous segment of the internal carotid artery bilaterally.  Irregularity with ectasia and narrowing involving the cavernous segment of the internal carotid artery bilaterally.  Moderate to marked narrowing of the supraclinoid segment of the internal carotid artery bilaterally with narrowing more notable on the right.  Abrupt loss of signal proximal M1 segment right middle cerebral artery with non visualized right middle cerebral artery branches consistent with patient's remote infarct.  Moderate to marked tandem  stenosis A1 segment of the left anterior cerebral artery. Moderate tandem stenosis A1 segment right anterior cerebral artery and involving the A2 segment of the middle cerebral artery bilaterally.  Moderate tandem stenosis of the M1 segment of the left middle cerebral artery most notable distal M1 segment. Moderate narrowing of portions of the left middle cerebral artery branches.  Mild to moderate narrowing distal vertebral arteries more notable on the right. Motion artifact may partially contribute to this finding.  Moderate narrowing proximal to mid aspect of the basilar artery.  Poor delineation of the right posterior inferior cerebellar artery and the anterior inferior cerebellar arteries bilaterally.  Mild to moderate narrowing of portions of the superior cerebellar arteries more notable on the left.  Mild to moderate narrowing involving portions of the posterior cerebral arteries bilaterally.  No up aneurysm detected.  IMPRESSION: No acute infarct.  Remote infarcts and prominent intracranial atherosclerotic type changes as detailed above.  Please see above for additional findings.   Electronically Signed   By: Bridgett Larsson M.D.   On: 01/11/2013 15:10    ROS:  As stated above in the HPI otherwise negative.  Blood pressure 91/55, pulse 90, temperature 98.1 F (36.7 C), temperature source Oral, resp. rate 16, height 5' (1.524 m), weight 129 lb 6.6 oz (58.7 kg), SpO2 99.00%.    PE: Gen: NAD, Alert and Oriented HEENT:  Bethany Cardenas/AT, EOMI Neck: Supple, no LAD Lungs: CTA Bilaterally CV: RRR without M/G/R ABM: Soft, NTND, +BS Ext: No C/C/E  Assessment/Plan: 1) Acute dysphagia. 2) Acute dysphonia.   I do not know the source of her complaints, however, it is not unreasonable to perform an EGD for further evaluation.  I doubt she has a stricture.    Plan: 1) EGD tomorrow.  Emelina Hinch D 01/12/2013, 4:21 PM

## 2013-01-13 ENCOUNTER — Encounter (HOSPITAL_COMMUNITY)
Admission: EM | Disposition: A | Discharge status: Home Health | Payer: Self-pay | Source: Home / Self Care | Attending: Family Medicine

## 2013-01-13 ENCOUNTER — Other Ambulatory Visit: Payer: Self-pay | Admitting: Cardiovascular Disease

## 2013-01-13 ENCOUNTER — Inpatient Hospital Stay (HOSPITAL_COMMUNITY): Payer: Medicare Other

## 2013-01-13 DIAGNOSIS — T17308A Unspecified foreign body in larynx causing other injury, initial encounter: Secondary | ICD-10-CM | POA: Diagnosis not present

## 2013-01-13 DIAGNOSIS — K219 Gastro-esophageal reflux disease without esophagitis: Secondary | ICD-10-CM | POA: Diagnosis not present

## 2013-01-13 DIAGNOSIS — R498 Other voice and resonance disorders: Secondary | ICD-10-CM | POA: Diagnosis not present

## 2013-01-13 DIAGNOSIS — R1310 Dysphagia, unspecified: Secondary | ICD-10-CM | POA: Diagnosis not present

## 2013-01-13 DIAGNOSIS — IMO0002 Reserved for concepts with insufficient information to code with codable children: Secondary | ICD-10-CM | POA: Diagnosis not present

## 2013-01-13 SURGERY — Surgical Case

## 2013-01-13 MED ORDER — MIDAZOLAM HCL 5 MG/ML IJ SOLN
INTRAMUSCULAR | Status: AC
  Filled 2013-01-13: qty 1

## 2013-01-13 MED ORDER — SODIUM CHLORIDE 0.9 % IV SOLN: INTRAVENOUS | Status: DC

## 2013-01-13 MED ORDER — FENTANYL CITRATE 0.05 MG/ML IJ SOLN
INTRAMUSCULAR | Status: AC
  Filled 2013-01-13: qty 2

## 2013-01-13 NOTE — Progress Notes (Signed)
Family Medicine Teaching Service Daily Progress Note Intern Pager: [phone]  Patient name: Bethany Cardenas Medical record number: [id number] Date of birth: 16-Dec-1922 Age: 80 y.o. Gender: female  Primary Care Provider: Howard Pouch, DO Consultants: GI Code Status: Full  Pt Overview and Major Events to Date:  12/30: MBS, CVA r/o 12/31: GI consult to see today 1/1: declined EGD, barium swallow no evidence obstruction, trial regular diet with supervision  Assessment and Plan: Bethany Cardenas is a 80 y.o. female with a history of afib, CVA, CAD, HLD, dementia, HTN, COPD presenting with a change in her voice and the sensation that something is caught in her throat.   # Rule out CVA:  -MRI/MRA: no acute infarct, carotid duplex without significant ICA stenosis bilaterally, echo negative for cardiac origin. -PT rec: Home health PT -OT rec: pending -Risk factor stratification:   A1c: 6.4  Lipids: wnl (LDL 65) -Given CVA ruled out, will return to ASA 81mg  chewable tablet  # Dysphagia: patient with sudden onset of sensation of something caught in her throat yesterday with progressive hoarseness and voice change. DDx: reflux, stricture, esophageal cancer, and pulmonary tree issues such as a laryngeal cancer.   -will start on IV protonix for potential GI cause  -GI consult appreciated, plan for EGD today but patient declined in endoscopy suite. -SLP: recommended transition to regular diet with intermittent supervision, small sips, upright 90 degrees  # A fib: rate increased to the 120's-130's in the ED. Patient without any symptoms with this.  -Rate control: Change metoprolol IV 5mg  q6hr > metoprolol 50mg  po BID -will monitor on tele  -repeat EKG unchanged  # HTN: normal range on admission.   -Continuing metoprolol for rate control -Not continuing home meds due to normotension: lasix 40mg  daily, HCTZ 25mg  daily, norvasc 10mg  daily, metoprolol 50mg  BID.  # History of diastolic CHF: no  recent echo with last in 2006 without mention of diastolic dysfunction.  -Repeat echo with preserved EF (55-60%) and inadequate study to assess diastolic function.  -monitor fluid status, KVO fluids  # COPD: will continue home combivent   FEN/GI: regular diet (see above), KVO Prophylaxis: heparin SQ  Disposition: Pending GI recommendations, CM to arrange home health  Subjective:  Patient seen after returning from endoscopy suite. She declined to have the EGD done since she thinks she had it done already. She denies any pain. She thinks her voice is improving. Denies CP, SOB, N/V. Last BM today. She says she feels like she could go home.  Objective: Temp:  [98.1 F (36.7 C)-98.6 F (37 C)] 98.6 F (37 C) (01/01 0517) Pulse Rate:  [87-105] 105 (01/01 0517) Resp:  [15-16] 16 (01/01 0517) BP: (91-133)/(55-64) 131/59 mmHg (01/01 0517) SpO2:  [98 %-99 %] 99 % (01/01 0517) Physical Exam: General: NAD, laying in bed HEENT: MMM, does not protrude tongue well but oropharynx appears non-erythematous, no obstruction. Cardiovascular: irreg irreg, no murmurs appreciated Respiratory: CTAB, effort slightly reduced Abdomen: bowel sounds present, soft, NTTP Extremities: no edema or cyanosis Neuro: awake, alert and oriented, speech impaired (baseline), CN II-XII grossly intact, diffusely weak.  Skin: warm and dry  Laboratory:  Recent Labs Lab 01/10/13 1657 01/10/13 1833 01/11/13 1429  WBC  --  6.8 6.0  HGB 15.3* 12.7 14.6  HCT 45.0 37.3 41.8  PLT  --  171 158    Recent Labs Lab 01/10/13 1657 01/11/13 1429  NA 139 139  K 4.2 4.0  CL 103 101  CO2  --  23  BUN 13 8  CREATININE 0.70 0.72  CALCIUM  --  9.3  GLUCOSE 114* 110*    Imaging/Diagnostic Tests:  MRI HEAD WITHOUT CONTRAST  MRA HEAD WITHOUT CONTRAST  TECHNIQUE:  Multiplanar, multiecho pulse sequences of the brain and surrounding  structures were obtained without intravenous contrast. Angiographic  images of the head  were obtained using MRA technique without  contrast.  COMPARISON: 01/10/2013 CT. No comparison MR.  FINDINGS:  MRI HEAD FINDINGS  Exam is motion degraded.  No acute infarct.  Remote infarct right basal ganglia, right operculum region and right  centrum semiovale with subsequent dilation of the right lateral  ventricle. Wallerian degeneration.  Remote small left cerebellar infarct.  Prominent mineral deposition globus pallidus without evidence of  intracranial hemorrhage.  Mild small vessel disease type changes.  Global atrophy without hydrocephalus.  No intracranial mass lesion noted on this unenhanced exam.  Mild cervical kyphosis centered at the C5 level mild spinal  stenosis.  Right cerebellar tonsils slightly low lying without a pointed  appearance. Pituitary region, pineal region and orbital structures  unremarkable.  Partial opacification inferior aspect mastoid air cells bilaterally.  Minimal paranasal sinus mucosal thickening.  MRA HEAD FINDINGS  Exam is motion degraded.  Moderate to marked narrowing of the pre cavernous segment of the  internal carotid artery bilaterally.  Irregularity with ectasia and narrowing involving the cavernous  segment of the internal carotid artery bilaterally.  Moderate to marked narrowing of the supraclinoid segment of the  internal carotid artery bilaterally with narrowing more notable on  the right.  Abrupt loss of signal proximal M1 segment right middle cerebral  artery with non visualized right middle cerebral artery branches  consistent with patient's remote infarct.  Moderate to marked tandem stenosis A1 segment of the left anterior  cerebral artery. Moderate tandem stenosis A1 segment right anterior  cerebral artery and involving the A2 segment of the middle cerebral  artery bilaterally.  Moderate tandem stenosis of the M1 segment of the left middle  cerebral artery most notable distal M1 segment. Moderate narrowing  of portions of  the left middle cerebral artery branches.  Mild to moderate narrowing distal vertebral arteries more notable on  the right. Motion artifact may partially contribute to this finding.  Moderate narrowing proximal to mid aspect of the basilar artery.  Poor delineation of the right posterior inferior cerebellar artery  and the anterior inferior cerebellar arteries bilaterally.  Mild to moderate narrowing of portions of the superior cerebellar  arteries more notable on the left.  Mild to moderate narrowing involving portions of the posterior  cerebral arteries bilaterally.  No up aneurysm detected.  IMPRESSION:  No acute infarct.  Remote infarcts and prominent intracranial atherosclerotic type  changes as detailed above.  Please see above for additional findings.  Echo:  - Left ventricle: The cavity size was normal. There was moderate concentric hypertrophy. Systolic function was normal. The estimated ejection fraction was in the range of 55% to 60%. The study is not technically sufficient to allow evaluation of LV diastolic function. - Aortic valve: Trileaflet. Sclerosis without stenosis. No regurgitation. - Mitral valve: Calcified annulus. There is tethering of the posterior leaflet and prolapse of the anterior leaflet at end-systole. Mild posteriorly directed mitral regurgitation. - Left atrium: The atrium was mildly dilated (area 21 cm2). - Tricuspid valve: Mildly thickened leaflets. Moderate regurgitation. - Pulmonary arteries: PA peak pressure: 57mm Hg (S). - Inferior vena cava: The vessel was normal in size; the  respirophasic diameter changes were in the normal range (= 50%); findings are consistent with normal central venous pressure. - Pericardium, extracardiac: There was no pericardial effusion.  Carotid duplex:  A vascular evaluation was performed with the patient in the supine position. Image quality was good. The right CCA, right ECA, right ICA, right vertebral,  left CCA, left ECA, left ICA, and left vertebral arteries were examined. Carotid duplex study. Carotid Duplex exam including 2D imaging, color and spectral Doppler were performed on the extracranial carotid and vertebral arteries using standard established protocols. Location: Vascular laboratory. Patient status: Inpatient.  Tawanna Sat, MD 01/13/2013, 7:15 AM PGY-1, [place] Intern pager: [phone], text pages welcome

## 2013-01-13 NOTE — Progress Notes (Signed)
Seen and examined.  Discussed with Dr. Lamar Benes.  Agree with his management.  Briefly, 80 yo female with dysphagia.  She has been seen by GI and by speech Rx.  She feels back to baseline.  I anticipate DC in am if tolerates PO tonight and tomorrow morning.

## 2013-01-13 NOTE — Progress Notes (Signed)
Patient  declined.  EGD not done.

## 2013-01-13 NOTE — Progress Notes (Signed)
  On call for Dr. Benson Norway  The patient was brought to the endoscopy lab - she declined to undergo the EGD as scheduled.  I think a barium swallow can provide useful info re: her sxs and will order that and follow-up on that study.  Gatha Mayer, MD, [facility] [phone] (pager) 01/13/2013 9:16 AM

## 2013-01-14 DIAGNOSIS — R498 Other voice and resonance disorders: Secondary | ICD-10-CM | POA: Diagnosis not present

## 2013-01-14 DIAGNOSIS — IMO0002 Reserved for concepts with insufficient information to code with codable children: Secondary | ICD-10-CM

## 2013-01-14 DIAGNOSIS — I5032 Chronic diastolic (congestive) heart failure: Secondary | ICD-10-CM | POA: Diagnosis not present

## 2013-01-14 DIAGNOSIS — T17308A Unspecified foreign body in larynx causing other injury, initial encounter: Secondary | ICD-10-CM | POA: Diagnosis not present

## 2013-01-14 DIAGNOSIS — F458 Other somatoform disorders: Secondary | ICD-10-CM | POA: Diagnosis not present

## 2013-01-14 DIAGNOSIS — M1710 Unilateral primary osteoarthritis, unspecified knee: Secondary | ICD-10-CM

## 2013-01-14 DIAGNOSIS — R1310 Dysphagia, unspecified: Secondary | ICD-10-CM | POA: Diagnosis not present

## 2013-01-14 MED ORDER — METOPROLOL TARTRATE 50 MG PO TABS
50.0000 mg | ORAL_TABLET | Freq: Two times a day (BID) | ORAL | Status: DC
  Administered 2013-01-14: 50 mg via ORAL
  Filled 2013-01-14 (×2): qty 1

## 2013-01-14 MED ORDER — METOPROLOL TARTRATE 25 MG PO TABS
25.0000 mg | ORAL_TABLET | Freq: Two times a day (BID) | ORAL | Status: DC
  Filled 2013-01-14 (×2): qty 1

## 2013-01-14 NOTE — Discharge Instructions (Signed)
Home Health  Services arranged with [facility]. [phone]. Registered Nurse, Physical Therapy, Aide and Education officer, museum. Durable Medical Equipment to be delivered to room via [facility].  Rolling Walker and 3n1  Please be sure to make follow up appointment with Dr. Raoul Pitch. You will need to call into the office on Monday to make an appointment within 1 week.   Dysphagia Swallowing problems (dysphagia) occur when solids and liquids seem to stick in your throat on the way down to your stomach, or the food takes longer to get to the stomach. Other symptoms include regurgitating food, noises coming from the throat, chest discomfort with swallowing, and a feeling of fullness or the feeling of something being stuck in your throat when swallowing. When blockage in your throat is complete it may be associated with drooling. CAUSES  Problems with swallowing may occur because of problems with the muscles. The food cannot be propelled in the usual manner into your stomach. You may have ulcers, scar tissue, or inflammation in the tube down which food travels from your mouth to your stomach (esophagus), which blocks food from passing normally into the stomach. Causes of inflammation include:  Acid reflux from your stomach into your esophagus.  Infection.  Radiation treatment for cancer.  Medicines taken without enough fluids to wash them down into your stomach. You may have nerve problems that prevent signals from being sent to the muscles of your esophagus to contract and move your food down to your stomach. Globus pharyngeus is a relatively common problem in which there is a sense of an obstruction or difficulty in swallowing, without any physical abnormalities of the swallowing passages being found. This problem usually improves over time with reassurance and testing to rule out other causes. DIAGNOSIS Dysphagia can be diagnosed and its cause can be determined by tests in which you swallow a white  substance that helps illuminate the inside of your throat (contrast medium) while X-rays are taken. Sometimes a flexible telescope that is inserted down your throat (endoscopy) to look at your esophagus and stomach is used. TREATMENT   If the dysphagia is caused by acid reflux or infection, medicines may be used.  If the dysphagia is caused by problems with your swallowing muscles, swallowing therapy may be used to help you strengthen your swallowing muscles.  If the dysphagia is caused by a blockage or mass, procedures to remove the blockage may be done. HOME CARE INSTRUCTIONS  Try to eat soft food that is easier to swallow and check your weight on a daily basis to be sure that it is not decreasing.  Be sure to drink liquids when sitting upright (not lying down). SEEK MEDICAL CARE IF:  You are losing weight because you are unable to swallow.  You are coughing when you drink liquids (aspiration).  You are coughing up partially digested food. SEEK IMMEDIATE MEDICAL CARE IF:  You are unable to swallow your own saliva .  You are having shortness of breath or a fever, or both.  You have a hoarse voice along with difficulty swallowing. MAKE SURE YOU:  Understand these instructions.  Will watch your condition.  Will get help right away if you are not doing well or get worse. Document Released: 12/28/1999 Document Revised: 09/01/2012 Document Reviewed: 06/18/2012 [facility] Patient Information 2014 [place].

## 2013-01-14 NOTE — Progress Notes (Signed)
Pt niece and sister provided with dc isntructions and education at bedside. All questions answered. PO metoprolol given to patient prior to dc. Iv removed with tip intact. Heart monitor cleaned and returned to front. Dorna Bloom, RN

## 2013-01-14 NOTE — Progress Notes (Signed)
Patient ID: BERLINE Cardenas, female   DOB: [birth year], 80 y.o.   MRN: [id number] Family Medicine Teaching Service Daily Progress Note Intern Pager: [phone]  Patient name: Bethany Cardenas Medical record number: [id number] Date of birth: [birth year] Age: 80 y.o. Gender: female  Primary Care Provider: Howard Pouch, DO Consultants: GI Code Status: Full  Pt Overview and Major Events to Date:  12/30: MBS, CVA r/o 12/31: GI consulted 1/1: declined EGD, barium swallow no evidence obstruction, trial regular diet with supervision  Assessment and Plan: Bethany Cardenas is a 80 y.o. female with a history of afib, CVA, CAD, HLD, dementia, HTN, COPD presenting with a change in her voice and the sensation that something is caught in her throat.   # Rule out CVA:  -MRI/MRA: no acute infarct, carotid duplex without significant ICA stenosis bilaterally, echo negative for cardiac origin. -PT rec: Home health PT -OT rec: pending -Risk factor stratification:   A1c: 6.4  Lipids: wnl (LDL 65) -Given CVA ruled out, will return to ASA 81mg  chewable tablet  # Dysphagia: patient with sudden onset of sensation of something caught in her throat yesterday with progressive hoarseness and voice change. DDx: reflux, stricture, esophageal cancer, and pulmonary tree issues such as a laryngeal cancer.   -will start on IV protonix for potential GI cause  -GI consult appreciated, plan for EGD today but patient declined in endoscopy suite. -SLP: recommended transition to regular diet with intermittent supervision, small sips, upright 90 degrees  # A fib: rate increased to the 120's-130's in the ED. Patient without any symptoms with this.  -Rate control: Change metoprolol IV 5mg  q6hr > metoprolol 50mg  po BID -will monitor on tele  -repeat EKG unchanged  # HTN: normal range on admission.  Will evaluate further as outpatient to decide on need of medications. Patient has not been hypotensive in office visits while on  medications. -Continuing metoprolol for rate control -Not continuing home meds due to normotension: lasix 40mg  daily, HCTZ 25mg  daily, norvasc 10mg  daily, metoprolol 50mg  BID.  # History of diastolic CHF: no recent echo with last in 2006 without mention of diastolic dysfunction.  -Repeat echo with preserved EF (55-60%) and inadequate study to assess diastolic function.  -monitor fluid status, KVO fluids  # COPD: will continue home combivent   FEN/GI: regular diet (see above), KVO Prophylaxis: heparin SQ  Disposition: CM to arrange home health, likely discharge today  Subjective:  Patient feels good today. She has been able to tolerate a regular diet and states she feels she is ready to go home. She still feel like something sticks in the bottom of her throat on occasions, but is able to clear it without complications.   Objective: Temp:  [97.2 F (36.2 C)-98.8 F (37.1 C)] 98.3 F (36.8 C) (01/02 0532) Pulse Rate:  [97-108] 108 (01/02 0532) Resp:  [16-20] 16 (01/02 0532) BP: (115-159)/(62-87) 144/66 mmHg (01/02 0532) SpO2:  [98 %-100 %] 98 % (01/02 0532) BP 144/66  Pulse 108  Temp(Src) 98.3 F (36.8 C) (Oral)  Resp 16  Ht 5' (1.524 m)  Wt 129 lb 6.6 oz (58.7 kg)  BMI 25.27 kg/m2  SpO2 98%  Physical Exam: General: NAD, sitting at bedside chair, eating meal.  HEENT: MMM Cardiovascular: irreg irreg, no murmurs appreciated, tachycardic (108) Respiratory: CTAB, effort slightly reduced Abdomen: bowel sounds present, soft, NTTP Extremities: no edema or cyanosis Neuro: awake, alert and oriented, speech impaired (baseline), CN II-XII grossly intact, diffusely weak.  Skin: warm and  dry  Laboratory:  Recent Labs Lab 01/10/13 1657 01/10/13 1833 01/11/13 1429  WBC  --  6.8 6.0  HGB 15.3* 12.7 14.6  HCT 45.0 37.3 41.8  PLT  --  171 158    Recent Labs Lab 01/10/13 1657 01/11/13 1429  NA 139 139  K 4.2 4.0  CL 103 101  CO2  --  23  BUN 13 8  CREATININE 0.70 0.72   CALCIUM  --  9.3  GLUCOSE 114* 110*    Imaging/Diagnostic Tests:  MRI HEAD WITHOUT CONTRAST  MRA HEAD WITHOUT CONTRAST  TECHNIQUE:  Multiplanar, multiecho pulse sequences of the brain and surrounding  structures were obtained without intravenous contrast. Angiographic  images of the head were obtained using MRA technique without  contrast.  COMPARISON: 01/10/2013 CT. No comparison MR.  FINDINGS:  MRI HEAD FINDINGS  Exam is motion degraded.  No acute infarct.  Remote infarct right basal ganglia, right operculum region and right  centrum semiovale with subsequent dilation of the right lateral  ventricle. Wallerian degeneration.  Remote small left cerebellar infarct.  Prominent mineral deposition globus pallidus without evidence of  intracranial hemorrhage.  Mild small vessel disease type changes.  Global atrophy without hydrocephalus.  No intracranial mass lesion noted on this unenhanced exam.  Mild cervical kyphosis centered at the C5 level mild spinal  stenosis.  Right cerebellar tonsils slightly low lying without a pointed  appearance. Pituitary region, pineal region and orbital structures  unremarkable.  Partial opacification inferior aspect mastoid air cells bilaterally.  Minimal paranasal sinus mucosal thickening.  MRA HEAD FINDINGS  Exam is motion degraded.  Moderate to marked narrowing of the pre cavernous segment of the  internal carotid artery bilaterally.  Irregularity with ectasia and narrowing involving the cavernous  segment of the internal carotid artery bilaterally.  Moderate to marked narrowing of the supraclinoid segment of the  internal carotid artery bilaterally with narrowing more notable on  the right.  Abrupt loss of signal proximal M1 segment right middle cerebral  artery with non visualized right middle cerebral artery branches  consistent with patient's remote infarct.  Moderate to marked tandem stenosis A1 segment of the left anterior   cerebral artery. Moderate tandem stenosis A1 segment right anterior  cerebral artery and involving the A2 segment of the middle cerebral  artery bilaterally.  Moderate tandem stenosis of the M1 segment of the left middle  cerebral artery most notable distal M1 segment. Moderate narrowing  of portions of the left middle cerebral artery branches.  Mild to moderate narrowing distal vertebral arteries more notable on  the right. Motion artifact may partially contribute to this finding.  Moderate narrowing proximal to mid aspect of the basilar artery.  Poor delineation of the right posterior inferior cerebellar artery  and the anterior inferior cerebellar arteries bilaterally.  Mild to moderate narrowing of portions of the superior cerebellar  arteries more notable on the left.  Mild to moderate narrowing involving portions of the posterior  cerebral arteries bilaterally.  No up aneurysm detected.  IMPRESSION:  No acute infarct.  Remote infarcts and prominent intracranial atherosclerotic type  changes as detailed above.  Please see above for additional findings.  Echo:  - Left ventricle: The cavity size was normal. There was moderate concentric hypertrophy. Systolic function was normal. The estimated ejection fraction was in the range of 55% to 60%. The study is not technically sufficient to allow evaluation of LV diastolic function. - Aortic valve: Trileaflet. Sclerosis without stenosis. No regurgitation. -  Mitral valve: Calcified annulus. There is tethering of the posterior leaflet and prolapse of the anterior leaflet at end-systole. Mild posteriorly directed mitral regurgitation. - Left atrium: The atrium was mildly dilated (area 21 cm2). - Tricuspid valve: Mildly thickened leaflets. Moderate regurgitation. - Pulmonary arteries: PA peak pressure: 45mm Hg (S). - Inferior vena cava: The vessel was normal in size; the respirophasic diameter changes were in the normal range  (= 50%); findings are consistent with normal central venous pressure. - Pericardium, extracardiac: There was no pericardial effusion.  Carotid duplex:  A vascular evaluation was performed with the patient in the supine position. Image quality was good. The right CCA, right ECA, right ICA, right vertebral, left CCA, left ECA, left ICA, and left vertebral arteries were examined. Carotid duplex study. Carotid Duplex exam including 2D imaging, color and spectral Doppler were performed on the extracranial carotid and vertebral arteries using standard established protocols. Location: Vascular laboratory. Patient status: Inpatient.  Ma Hillock, DO 01/14/2013, 9:55 AM PGY-2, [place] Intern pager: [phone], text pages welcome

## 2013-01-14 NOTE — Progress Notes (Signed)
Seen and examined.  Discussed with Dr. Raoul Pitch.  Agree with her documentation and management.  Bethany Cardenas has returned to her pre choking baseline and is ready for DC today.

## 2013-01-14 NOTE — Progress Notes (Signed)
Subjective: No issues with dysphagia or dysphonia.  Objective: Vital signs in last 24 hours: Temp:  [97.2 F (36.2 C)-98.8 F (37.1 C)] 98.3 F (36.8 C) (01/02 0532) Pulse Rate:  [94-110] 108 (01/02 0532) Resp:  [16-20] 16 (01/02 0532) BP: (115-159)/(62-87) 144/66 mmHg (01/02 0532) SpO2:  [98 %-100 %] 98 % (01/02 0532) Last BM Date: 01/13/13  Intake/Output from previous day: 01/01 0701 - 01/02 0700 In: 520 [P.O.:520] Out: -  Intake/Output this shift:    General appearance: alert and no distress GI: soft, non-tender; bowel sounds normal; no masses,  no organomegaly  Lab Results:  Recent Labs  01/11/13 1429  WBC 6.0  HGB 14.6  HCT 41.8  PLT 158   BMET  Recent Labs  01/11/13 1429  NA 139  K 4.0  CL 101  CO2 23  GLUCOSE 110*  BUN 8  CREATININE 0.72  CALCIUM 9.3   LFT No results found for this basename: PROT, ALBUMIN, AST, ALT, ALKPHOS, BILITOT, BILIDIR, IBILI,  in the last 72 hours PT/INR No results found for this basename: LABPROT, INR,  in the last 72 hours Hepatitis Panel No results found for this basename: HEPBSAG, HCVAB, HEPAIGM, HEPBIGM,  in the last 72 hours C-Diff No results found for this basename: CDIFFTOX,  in the last 72 hours Fecal Lactopherrin No results found for this basename: FECLLACTOFRN,  in the last 72 hours  Studies/Results: Dg Esophagus  01/13/2013   CLINICAL DATA:  Dysphagia  EXAM: ESOPHOGRAM/BARIUM SWALLOW  TECHNIQUE: Single contrast examination was performed using  thin barium.  COMPARISON:  None.  FLUOROSCOPY TIME:  1 min 5 seconds  FINDINGS: Examination is somewhat limited as do entirety of the examination was performed in the semi-upright oblique position without effervescent crystals. There was normal pharyngeal anatomy and motility. Contrast flowed freely through the esophagus without evidence of stricture or mass. There was grossly normal esophageal mucosa without evidence of irregularity or ulceration. Esophageal motility was  normal. There is gastroesophageal reflux. There was a small hiatal hernia.  At the end of the examination a 12.20mm barium tablet was administered which transited through the esophagus and esophagogastric junction without delay.  IMPRESSION: 1. No evidence of esophageal obstruction. Normal passage of a 12.5 mm barium tablet.  2.  Gastroesophageal reflux.   Electronically Signed   By: Kathreen Devoid   On: 01/13/2013 12:09    Medications:  Scheduled: . aspirin  81 mg Oral Daily  . heparin subcutaneous  5,000 Units Subcutaneous Q8H  . metoprolol  5 mg Intravenous Q6H  . pantoprazole  40 mg Oral QHS   Continuous: . sodium chloride 10 mL (01/12/13 2341)  . sodium chloride      Assessment/Plan: 1) Dysphagia. 2) Dysphonia.   She reports that her symptoms have resolved.  The esophagram with the 12 mm barium tablet was negative for any strictures.  The examination was normal.  Plan: 1) No further GI intervention. 2) Signing off.   LOS: 4 days   Sankalp Ferrell D 01/14/2013, 7:58 AM

## 2013-01-16 DIAGNOSIS — I4891 Unspecified atrial fibrillation: Secondary | ICD-10-CM | POA: Diagnosis not present

## 2013-01-16 DIAGNOSIS — Z87440 Personal history of urinary (tract) infections: Secondary | ICD-10-CM | POA: Diagnosis not present

## 2013-01-16 DIAGNOSIS — I5032 Chronic diastolic (congestive) heart failure: Secondary | ICD-10-CM | POA: Diagnosis not present

## 2013-01-16 DIAGNOSIS — R1310 Dysphagia, unspecified: Secondary | ICD-10-CM | POA: Diagnosis not present

## 2013-01-16 DIAGNOSIS — F0390 Unspecified dementia without behavioral disturbance: Secondary | ICD-10-CM | POA: Diagnosis not present

## 2013-01-16 DIAGNOSIS — I509 Heart failure, unspecified: Secondary | ICD-10-CM | POA: Diagnosis not present

## 2013-01-16 DIAGNOSIS — J441 Chronic obstructive pulmonary disease with (acute) exacerbation: Secondary | ICD-10-CM | POA: Diagnosis not present

## 2013-01-16 NOTE — Discharge Summary (Signed)
[facility] Discharge Summary  Patient name: Bethany Cardenas Medical record number: [id number] Date of birth: 25-Feb-1922 Age: 80 y.o. Gender: female Date of Admission: 01/10/2013  Date of Discharge: 01/14/2013 Admitting Physician: Andrena Mews, MD  Primary Care Provider: Howard Pouch, DO Consultants: Gastroenterology  Indication for Hospitalization: Dysphagia, dysphonia  Discharge Diagnoses/Problem List:  Acute dysphagia and dysphonia, resolved History of CVA CAD Hyperlipidemia Dementia Hypertension COPD  Disposition: home with home health RN, PT, aide, SW  Discharge Condition: stable  Brief Hospital Course:  Bethany Cardenas is a 80 y.o. female with a history of afib, CVA, CAD, HLD, dementia, HTN, COPD presenting with acute dysphagia and dysphonia. Initial workup included CVA rule out and CT soft tissue of the neck showing no obvious mass, speech path evaluation demonstrating oral and pharyngeal phase dysphagia. GI was consulted for possible EGD which the patient refused while in the endoscopic suite. Barium swallow was done and showed no obstruction. Patient symptoms improved during hospital course and she felt she was mostly back to baseline by the time of discharge on HD #5.  1. Acute Dysphagia and Dysphonia: had episode of choking sensation while eating 1 day prior to admission for which she was evaluated at the ED. On admission here workup including CVA rule out (outlined below) and initial CT soft tissue of the neck were negative for obvious cause of the acute dysphagia/dysphonia. Initial evaluation by speech language pathologist showed mild oral and pharyngeal phase deficits, and she was started on a dysphagia 3 (soft) diet which she tolerated well and had already improved by HD #1 an 2. GI was consulted and agreed an EGD could possibly give an answer. This was scheduled for HD #3, however the patient declined to have the EGD done since she believed she  already had it done before. Barium swallow was done which showed no evidence of obstruction or stricture. Patient symptoms had almost all resolved and she tolerated a regular diet for 2 days before being discharged to home with home health services. 2. CVA rule out: given somewhat acute onset of dysphagia and dysphonia, patient was initially worked up for CVA and started on high dose aspirin. Risk stratification labs showed normal lipid panel (LDL 65), A1c 6.4. Carotid dopplers were without evidence of significant ICA stenosis, and 2d echo showed normal EF 55-60% with moderate concentric hypertrophy. MRI and MRA were negative for acute infarct (remote right basal ganglia infarct, M1 segment right MCA occluded consistent with this) and mild/moderate narrowing of distal vertebral arteries, right PICA and AICA, superior cerebellar and posterior cerebral arteries. After workup was negative patient was switched back to low dose aspirin, which was continued on discharge.  Issues for Follow Up:  1. Consider GI referral for EGD if continued symptoms.  Significant Procedures: echo, carotid doppler, imaging per below  Significant Labs and Imaging:   Recent Labs Lab 01/10/13 1657 01/10/13 1833 01/11/13 1429  WBC  --  6.8 6.0  HGB 15.3* 12.7 14.6  HCT 45.0 37.3 41.8  PLT  --  171 158    Recent Labs Lab 01/10/13 1657 01/11/13 1429  NA 139 139  K 4.2 4.0  CL 103 101  CO2  --  23  GLUCOSE 114* 110*  BUN 13 8  CREATININE 0.70 0.72  CALCIUM  --  9.3   Lipid Panel     Component Value Date/Time   CHOL 148 01/11/2013 0720   TRIG 43 01/11/2013 0720   HDL 74 01/11/2013  0720   CHOLHDL 2.0 01/11/2013 0720   VLDL 9 01/11/2013 0720   LDLCALC 65 01/11/2013 0720   A1c 6.4  CT chest wo contrast (12/29): IMPRESSION:  1. Nonspecific vague finding within the lateral segment right middle  lobe differential considerations are scarring versus posterior early  or mild infiltrate. Surveillance  evaluation of this finding and 3-6  months is recommended.  2. Mild interstitial findings within the lung bases right greater  than left.  3. No CT evidence of a radiopaque foreign body within the lumen of  the esophagus nor along the course of the esophagus.  4. Cardiomegaly  5. Atherosclerotic disease  CT soft tissue neck wo contrast (12/29): IMPRESSION:  1. Asymmetric fullness within the right posterior nasopharynx  without definite discrete mass lesion. This finding is  indeterminate, and may be partially related to patient positioning.  Correlation with direct visualization for further evaluation could  be performed as clinically indicated.  2. Otherwise unremarkable noncontrast CT of the neck without  evidence of mass lesion, adenopathy, or loculated fluid collection.  CT head wo contrast (12/29): IMPRESSION:  No acute intracranial pathology.  2v CXR (12/29): IMPRESSION:  No active disease. Cardiomegaly. Chronic pulmonary markings.  MRI/MRA (12/30): MRI HEAD FINDINGS  Exam is motion degraded.  No acute infarct.  Remote infarct right basal ganglia, right operculum region and right centrum semiovale with subsequent dilation of the right lateral ventricle. Wallerian degeneration.  Remote small left cerebellar infarct.  Prominent mineral deposition globus pallidus without evidence of intracranial hemorrhage.  Mild small vessel disease type changes.  Global atrophy without hydrocephalus.  No intracranial mass lesion noted on this unenhanced exam.  Mild cervical kyphosis centered at the C5 level mild spinal stenosis.  Right cerebellar tonsils slightly low lying without a pointed appearance. Pituitary region, pineal region and orbital structures unremarkable.  Partial opacification inferior aspect mastoid air cells bilaterally.  Minimal paranasal sinus mucosal thickening.  MRA HEAD FINDINGS  Exam is motion degraded.  Moderate to marked narrowing of the pre cavernous segment  of the internal carotid artery bilaterally.  Irregularity with ectasia and narrowing involving the cavernous segment of the internal carotid artery bilaterally.  Moderate to marked narrowing of the supraclinoid segment of the internal carotid artery bilaterally with narrowing more notable on the right.  Abrupt loss of signal proximal M1 segment right middle cerebral artery with non visualized right middle cerebral artery branches consistent with patient's remote infarct.  Moderate to marked tandem stenosis A1 segment of the left anterior cerebral artery. Moderate tandem stenosis A1 segment right anterior cerebral artery and involving the A2 segment of the middle cerebral artery bilaterally.  Moderate tandem stenosis of the M1 segment of the left middle cerebral artery most notable distal M1 segment. Moderate narrowing of portions of the left middle cerebral artery branches.  Mild to moderate narrowing distal vertebral arteries more notable on the right. Motion artifact may partially contribute to this finding.  Moderate narrowing proximal to mid aspect of the basilar artery.  Poor delineation of the right posterior inferior cerebellar artery and the anterior inferior cerebellar arteries bilaterally.  Mild to moderate narrowing of portions of the superior cerebellar arteries more notable on the left.  Mild to moderate narrowing involving portions of the posterior  cerebral arteries bilaterally.  No up aneurysm detected.  IMPRESSION:  No acute infarct.  Remote infarcts and prominent intracranial atherosclerotic type  changes as detailed above.  Barium swallow (1/1): IMPRESSION:  1. No evidence of esophageal obstruction.  Normal passage of a 12.5  mm barium tablet.  2. Gastroesophageal reflux.  Results/Tests Pending at Time of Discharge: none  Discharge Medications:    Medication List    STOP taking these medications       amLODipine 10 MG tablet  Commonly known as:  NORVASC      hydrochlorothiazide 25 MG tablet  Commonly known as:  HYDRODIURIL     isosorbide mononitrate 30 MG 24 hr tablet  Commonly known as:  IMDUR      TAKE these medications       ADULT ASPIRIN EC LOW STRENGTH 81 MG EC tablet  Generic drug:  aspirin  Take 81 mg by mouth daily.     albuterol-ipratropium 18-103 MCG/ACT inhaler  Commonly known as:  COMBIVENT  Inhale 1 puff into the lungs every 6 (six) hours as needed for wheezing or shortness of breath.     ergocalciferol 50000 UNITS capsule  Commonly known as:  VITAMIN D2  Take 50,000 Units by mouth once a week. Unknown day of the week     furosemide 40 MG tablet  Commonly known as:  LASIX  Take 40 mg by mouth daily.     metoprolol 50 MG tablet  Commonly known as:  LOPRESSOR  Take 50 mg by mouth 2 (two) times daily.     pantoprazole 40 MG tablet  Commonly known as:  PROTONIX  Take 1 tablet (40 mg total) by mouth daily.     simvastatin 40 MG tablet  Commonly known as:  ZOCOR  Take 40 mg by mouth daily.        Discharge Instructions: Please refer to Patient Instructions section of EMR for full details.  Patient was counseled important signs and symptoms that should prompt return to medical care, changes in medications, dietary instructions, activity restrictions, and follow up appointments.   Follow-Up Appointments:     Follow-up Information   Follow up with Kuneff, Renee, DO. Call on 01/17/2013. (make an appoitnment for 1 week aftter discharge)    Specialty:  Family Medicine   Contact information:   [place] [address] [phone]       Tawanna Sat, MD 01/16/2013, 1:23 PM PGY-1, [place]

## 2013-01-17 ENCOUNTER — Telehealth: Payer: Self-pay | Admitting: Family Medicine

## 2013-01-17 DIAGNOSIS — I509 Heart failure, unspecified: Secondary | ICD-10-CM | POA: Diagnosis not present

## 2013-01-17 DIAGNOSIS — I10 Essential (primary) hypertension: Secondary | ICD-10-CM

## 2013-01-17 DIAGNOSIS — J441 Chronic obstructive pulmonary disease with (acute) exacerbation: Secondary | ICD-10-CM | POA: Diagnosis not present

## 2013-01-17 DIAGNOSIS — F0390 Unspecified dementia without behavioral disturbance: Secondary | ICD-10-CM | POA: Diagnosis not present

## 2013-01-17 DIAGNOSIS — I4891 Unspecified atrial fibrillation: Secondary | ICD-10-CM | POA: Diagnosis not present

## 2013-01-17 DIAGNOSIS — I5032 Chronic diastolic (congestive) heart failure: Secondary | ICD-10-CM | POA: Diagnosis not present

## 2013-01-17 DIAGNOSIS — R1310 Dysphagia, unspecified: Secondary | ICD-10-CM | POA: Diagnosis not present

## 2013-01-17 NOTE — Telephone Encounter (Signed)
Refill request for metropolol sent to [facility]. Patient has made f/u appt with [facility] for 1/13 @ 2:30

## 2013-01-17 NOTE — Discharge Summary (Signed)
Seen and examined on the day of DC.  Agree with Dr. Marissa Calamity documentation and management.

## 2013-01-18 DIAGNOSIS — J441 Chronic obstructive pulmonary disease with (acute) exacerbation: Secondary | ICD-10-CM | POA: Diagnosis not present

## 2013-01-18 DIAGNOSIS — I5032 Chronic diastolic (congestive) heart failure: Secondary | ICD-10-CM | POA: Diagnosis not present

## 2013-01-18 DIAGNOSIS — R1310 Dysphagia, unspecified: Secondary | ICD-10-CM | POA: Diagnosis not present

## 2013-01-18 DIAGNOSIS — I509 Heart failure, unspecified: Secondary | ICD-10-CM | POA: Diagnosis not present

## 2013-01-18 DIAGNOSIS — I4891 Unspecified atrial fibrillation: Secondary | ICD-10-CM | POA: Diagnosis not present

## 2013-01-18 DIAGNOSIS — F0390 Unspecified dementia without behavioral disturbance: Secondary | ICD-10-CM | POA: Diagnosis not present

## 2013-01-18 MED ORDER — METOPROLOL TARTRATE 50 MG PO TABS: 50.0000 mg | ORAL_TABLET | Freq: Two times a day (BID) | ORAL | Status: DC

## 2013-01-18 NOTE — Telephone Encounter (Signed)
Spoke with patient's daughter.  Is out of metoprolol and requesting refill.  Recently discharged from hospital.  Paged Dr. Raoul Pitch via [facility] to clarify metoprolol strength/dosage.  Patient should be on 50 mg BID.  Rx refilled to pharmacy.  Attempted to call Ms. Walker at [phone], but phone # disconnected.  Called home number for patient and left message with sister to check with pharmacy for refill.  Nolene Ebbs, RN

## 2013-01-19 DIAGNOSIS — I4891 Unspecified atrial fibrillation: Secondary | ICD-10-CM | POA: Diagnosis not present

## 2013-01-19 DIAGNOSIS — R1310 Dysphagia, unspecified: Secondary | ICD-10-CM | POA: Diagnosis not present

## 2013-01-19 DIAGNOSIS — F0390 Unspecified dementia without behavioral disturbance: Secondary | ICD-10-CM | POA: Diagnosis not present

## 2013-01-19 DIAGNOSIS — J441 Chronic obstructive pulmonary disease with (acute) exacerbation: Secondary | ICD-10-CM | POA: Diagnosis not present

## 2013-01-19 DIAGNOSIS — I509 Heart failure, unspecified: Secondary | ICD-10-CM | POA: Diagnosis not present

## 2013-01-19 DIAGNOSIS — I5032 Chronic diastolic (congestive) heart failure: Secondary | ICD-10-CM | POA: Diagnosis not present

## 2013-01-20 DIAGNOSIS — R1310 Dysphagia, unspecified: Secondary | ICD-10-CM | POA: Diagnosis not present

## 2013-01-20 DIAGNOSIS — F0390 Unspecified dementia without behavioral disturbance: Secondary | ICD-10-CM | POA: Diagnosis not present

## 2013-01-20 DIAGNOSIS — I5032 Chronic diastolic (congestive) heart failure: Secondary | ICD-10-CM | POA: Diagnosis not present

## 2013-01-20 DIAGNOSIS — J441 Chronic obstructive pulmonary disease with (acute) exacerbation: Secondary | ICD-10-CM | POA: Diagnosis not present

## 2013-01-20 DIAGNOSIS — I509 Heart failure, unspecified: Secondary | ICD-10-CM | POA: Diagnosis not present

## 2013-01-20 DIAGNOSIS — I4891 Unspecified atrial fibrillation: Secondary | ICD-10-CM | POA: Diagnosis not present

## 2013-01-21 DIAGNOSIS — I509 Heart failure, unspecified: Secondary | ICD-10-CM | POA: Diagnosis not present

## 2013-01-21 DIAGNOSIS — J441 Chronic obstructive pulmonary disease with (acute) exacerbation: Secondary | ICD-10-CM | POA: Diagnosis not present

## 2013-01-21 DIAGNOSIS — R1310 Dysphagia, unspecified: Secondary | ICD-10-CM | POA: Diagnosis not present

## 2013-01-21 DIAGNOSIS — F0390 Unspecified dementia without behavioral disturbance: Secondary | ICD-10-CM | POA: Diagnosis not present

## 2013-01-21 DIAGNOSIS — I5032 Chronic diastolic (congestive) heart failure: Secondary | ICD-10-CM | POA: Diagnosis not present

## 2013-01-21 DIAGNOSIS — I4891 Unspecified atrial fibrillation: Secondary | ICD-10-CM | POA: Diagnosis not present

## 2013-01-22 ENCOUNTER — Other Ambulatory Visit: Payer: Self-pay | Admitting: Family Medicine

## 2013-01-22 DIAGNOSIS — I509 Heart failure, unspecified: Secondary | ICD-10-CM | POA: Diagnosis not present

## 2013-01-22 DIAGNOSIS — I5032 Chronic diastolic (congestive) heart failure: Secondary | ICD-10-CM | POA: Diagnosis not present

## 2013-01-22 DIAGNOSIS — J441 Chronic obstructive pulmonary disease with (acute) exacerbation: Secondary | ICD-10-CM | POA: Diagnosis not present

## 2013-01-22 DIAGNOSIS — F0390 Unspecified dementia without behavioral disturbance: Secondary | ICD-10-CM | POA: Diagnosis not present

## 2013-01-22 DIAGNOSIS — I4891 Unspecified atrial fibrillation: Secondary | ICD-10-CM | POA: Diagnosis not present

## 2013-01-22 DIAGNOSIS — R1310 Dysphagia, unspecified: Secondary | ICD-10-CM | POA: Diagnosis not present

## 2013-01-24 DIAGNOSIS — I4891 Unspecified atrial fibrillation: Secondary | ICD-10-CM | POA: Diagnosis not present

## 2013-01-24 DIAGNOSIS — I5032 Chronic diastolic (congestive) heart failure: Secondary | ICD-10-CM | POA: Diagnosis not present

## 2013-01-24 DIAGNOSIS — J441 Chronic obstructive pulmonary disease with (acute) exacerbation: Secondary | ICD-10-CM | POA: Diagnosis not present

## 2013-01-24 DIAGNOSIS — R1310 Dysphagia, unspecified: Secondary | ICD-10-CM | POA: Diagnosis not present

## 2013-01-24 DIAGNOSIS — F0390 Unspecified dementia without behavioral disturbance: Secondary | ICD-10-CM | POA: Diagnosis not present

## 2013-01-24 DIAGNOSIS — I509 Heart failure, unspecified: Secondary | ICD-10-CM | POA: Diagnosis not present

## 2013-01-25 ENCOUNTER — Ambulatory Visit (INDEPENDENT_AMBULATORY_CARE_PROVIDER_SITE_OTHER): Payer: Medicare Other | Admitting: Family Medicine

## 2013-01-25 ENCOUNTER — Encounter: Payer: Self-pay | Admitting: Family Medicine

## 2013-01-25 VITALS — BP 159/79 | HR 79 | Temp 97.7°F | Ht 60.0 in | Wt 120.0 lb

## 2013-01-25 DIAGNOSIS — J309 Allergic rhinitis, unspecified: Secondary | ICD-10-CM | POA: Insufficient documentation

## 2013-01-25 MED ORDER — FLUTICASONE PROPIONATE 50 MCG/ACT NA SUSP: 2.0000 | Freq: Every day | NASAL | Status: DC

## 2013-01-25 MED ORDER — CETIRIZINE HCL 5 MG PO TABS: 5.0000 mg | ORAL_TABLET | Freq: Every day | ORAL | Status: DC

## 2013-01-25 NOTE — Progress Notes (Signed)
   Subjective:    Patient ID: Bethany Cardenas, female    DOB: Jun 11, 1922, 80 y.o.   MRN: [id number]  HPI  Hospital followup: Patient port she's doing "just fine". Her recent hospital stay did not show signs of stroke. Her dysphagia resolved and patient refused EGD. She reports that she is doing perfectly fine no longer having problems with eating or drinking or feeling like something stuck in her throat. She does have a residual small cough that she says she feels like phlegm gets stuck in her throat she has to cough it up. She does produce clear phlegm on coughing. Her sisters report that she is eating really well and has having no complications at home.  Patient does affirm today that she still has been taking her aspirin 81 mg daily at home.  Review of Systems Negative, with the exception of above mentioned in HPI  Objective:   Physical Exam BP 159/79  Pulse 79  Temp(Src) 97.7 F (36.5 C) (Oral)  Ht 5' (1.524 m)  Wt 119 lb 15.4 oz (54.413 kg)  BMI 23.43 kg/m2 Gen: NAD. Extremely pleasant and happy. HEENT: AT. [place]. Bilateral TM visualized and dull in appearance. Bilateral eyes without injections or icterus. MMM. Bilateral nares pale. Throat without erythema or exudates.  CV: RRR  Chest: CTAB, no wheeze or crackles

## 2013-01-25 NOTE — Patient Instructions (Signed)
I have called in zyrtec and Flonase for you to take for your allergies.  I would ask you pharmacist for the kit to clean out your ears.  If your cough does not improve after being on allergy mediations for 2 weeks then please make an appointment to be seen again.  It was great seeing you ladies again.

## 2013-01-25 NOTE — Assessment & Plan Note (Signed)
Pt with allergic rhinitis. - prescribed 5 mg Zyrtec QHS.  - Flonase daily

## 2013-01-26 DIAGNOSIS — F0390 Unspecified dementia without behavioral disturbance: Secondary | ICD-10-CM | POA: Diagnosis not present

## 2013-01-26 DIAGNOSIS — I5032 Chronic diastolic (congestive) heart failure: Secondary | ICD-10-CM | POA: Diagnosis not present

## 2013-01-26 DIAGNOSIS — I509 Heart failure, unspecified: Secondary | ICD-10-CM | POA: Diagnosis not present

## 2013-01-26 DIAGNOSIS — R1310 Dysphagia, unspecified: Secondary | ICD-10-CM | POA: Diagnosis not present

## 2013-01-26 DIAGNOSIS — J441 Chronic obstructive pulmonary disease with (acute) exacerbation: Secondary | ICD-10-CM | POA: Diagnosis not present

## 2013-01-26 DIAGNOSIS — I4891 Unspecified atrial fibrillation: Secondary | ICD-10-CM | POA: Diagnosis not present

## 2013-01-27 DIAGNOSIS — J441 Chronic obstructive pulmonary disease with (acute) exacerbation: Secondary | ICD-10-CM | POA: Diagnosis not present

## 2013-01-27 DIAGNOSIS — I5032 Chronic diastolic (congestive) heart failure: Secondary | ICD-10-CM | POA: Diagnosis not present

## 2013-01-27 DIAGNOSIS — F0390 Unspecified dementia without behavioral disturbance: Secondary | ICD-10-CM | POA: Diagnosis not present

## 2013-01-27 DIAGNOSIS — I509 Heart failure, unspecified: Secondary | ICD-10-CM | POA: Diagnosis not present

## 2013-01-27 DIAGNOSIS — I4891 Unspecified atrial fibrillation: Secondary | ICD-10-CM | POA: Diagnosis not present

## 2013-01-27 DIAGNOSIS — R1310 Dysphagia, unspecified: Secondary | ICD-10-CM | POA: Diagnosis not present

## 2013-01-30 ENCOUNTER — Other Ambulatory Visit: Payer: Self-pay | Admitting: Family Medicine

## 2013-01-31 DIAGNOSIS — F0390 Unspecified dementia without behavioral disturbance: Secondary | ICD-10-CM | POA: Diagnosis not present

## 2013-01-31 DIAGNOSIS — J441 Chronic obstructive pulmonary disease with (acute) exacerbation: Secondary | ICD-10-CM | POA: Diagnosis not present

## 2013-01-31 DIAGNOSIS — I509 Heart failure, unspecified: Secondary | ICD-10-CM | POA: Diagnosis not present

## 2013-01-31 DIAGNOSIS — R1310 Dysphagia, unspecified: Secondary | ICD-10-CM | POA: Diagnosis not present

## 2013-01-31 DIAGNOSIS — I5032 Chronic diastolic (congestive) heart failure: Secondary | ICD-10-CM | POA: Diagnosis not present

## 2013-01-31 DIAGNOSIS — I4891 Unspecified atrial fibrillation: Secondary | ICD-10-CM | POA: Diagnosis not present

## 2013-02-02 DIAGNOSIS — I5032 Chronic diastolic (congestive) heart failure: Secondary | ICD-10-CM | POA: Diagnosis not present

## 2013-02-02 DIAGNOSIS — J441 Chronic obstructive pulmonary disease with (acute) exacerbation: Secondary | ICD-10-CM | POA: Diagnosis not present

## 2013-02-02 DIAGNOSIS — I4891 Unspecified atrial fibrillation: Secondary | ICD-10-CM | POA: Diagnosis not present

## 2013-02-02 DIAGNOSIS — R1310 Dysphagia, unspecified: Secondary | ICD-10-CM | POA: Diagnosis not present

## 2013-02-02 DIAGNOSIS — F0390 Unspecified dementia without behavioral disturbance: Secondary | ICD-10-CM | POA: Diagnosis not present

## 2013-02-02 DIAGNOSIS — I509 Heart failure, unspecified: Secondary | ICD-10-CM | POA: Diagnosis not present

## 2013-02-07 DIAGNOSIS — F0390 Unspecified dementia without behavioral disturbance: Secondary | ICD-10-CM | POA: Diagnosis not present

## 2013-02-07 DIAGNOSIS — I509 Heart failure, unspecified: Secondary | ICD-10-CM | POA: Diagnosis not present

## 2013-02-07 DIAGNOSIS — I4891 Unspecified atrial fibrillation: Secondary | ICD-10-CM | POA: Diagnosis not present

## 2013-02-07 DIAGNOSIS — R1310 Dysphagia, unspecified: Secondary | ICD-10-CM | POA: Diagnosis not present

## 2013-02-07 DIAGNOSIS — J441 Chronic obstructive pulmonary disease with (acute) exacerbation: Secondary | ICD-10-CM | POA: Diagnosis not present

## 2013-02-07 DIAGNOSIS — I5032 Chronic diastolic (congestive) heart failure: Secondary | ICD-10-CM | POA: Diagnosis not present

## 2013-02-10 DIAGNOSIS — I5032 Chronic diastolic (congestive) heart failure: Secondary | ICD-10-CM | POA: Diagnosis not present

## 2013-02-10 DIAGNOSIS — I509 Heart failure, unspecified: Secondary | ICD-10-CM | POA: Diagnosis not present

## 2013-02-10 DIAGNOSIS — R1310 Dysphagia, unspecified: Secondary | ICD-10-CM | POA: Diagnosis not present

## 2013-02-10 DIAGNOSIS — F0390 Unspecified dementia without behavioral disturbance: Secondary | ICD-10-CM | POA: Diagnosis not present

## 2013-02-10 DIAGNOSIS — J441 Chronic obstructive pulmonary disease with (acute) exacerbation: Secondary | ICD-10-CM | POA: Diagnosis not present

## 2013-02-10 DIAGNOSIS — I4891 Unspecified atrial fibrillation: Secondary | ICD-10-CM | POA: Diagnosis not present

## 2013-02-12 DIAGNOSIS — F0390 Unspecified dementia without behavioral disturbance: Secondary | ICD-10-CM | POA: Diagnosis not present

## 2013-02-12 DIAGNOSIS — R1310 Dysphagia, unspecified: Secondary | ICD-10-CM | POA: Diagnosis not present

## 2013-02-12 DIAGNOSIS — J441 Chronic obstructive pulmonary disease with (acute) exacerbation: Secondary | ICD-10-CM | POA: Diagnosis not present

## 2013-02-12 DIAGNOSIS — I4891 Unspecified atrial fibrillation: Secondary | ICD-10-CM | POA: Diagnosis not present

## 2013-02-12 DIAGNOSIS — I509 Heart failure, unspecified: Secondary | ICD-10-CM | POA: Diagnosis not present

## 2013-02-12 DIAGNOSIS — I5032 Chronic diastolic (congestive) heart failure: Secondary | ICD-10-CM | POA: Diagnosis not present

## 2013-02-13 DEATH — deceased

## 2013-02-15 DIAGNOSIS — I509 Heart failure, unspecified: Secondary | ICD-10-CM | POA: Diagnosis not present

## 2013-02-15 DIAGNOSIS — F0390 Unspecified dementia without behavioral disturbance: Secondary | ICD-10-CM | POA: Diagnosis not present

## 2013-02-15 DIAGNOSIS — J441 Chronic obstructive pulmonary disease with (acute) exacerbation: Secondary | ICD-10-CM | POA: Diagnosis not present

## 2013-02-15 DIAGNOSIS — I4891 Unspecified atrial fibrillation: Secondary | ICD-10-CM | POA: Diagnosis not present

## 2013-02-15 DIAGNOSIS — R1310 Dysphagia, unspecified: Secondary | ICD-10-CM | POA: Diagnosis not present

## 2013-02-15 DIAGNOSIS — I5032 Chronic diastolic (congestive) heart failure: Secondary | ICD-10-CM | POA: Diagnosis not present

## 2013-02-18 DIAGNOSIS — I4891 Unspecified atrial fibrillation: Secondary | ICD-10-CM | POA: Diagnosis not present

## 2013-02-18 DIAGNOSIS — I5032 Chronic diastolic (congestive) heart failure: Secondary | ICD-10-CM | POA: Diagnosis not present

## 2013-02-18 DIAGNOSIS — R1310 Dysphagia, unspecified: Secondary | ICD-10-CM | POA: Diagnosis not present

## 2013-02-18 DIAGNOSIS — F0390 Unspecified dementia without behavioral disturbance: Secondary | ICD-10-CM | POA: Diagnosis not present

## 2013-02-18 DIAGNOSIS — I509 Heart failure, unspecified: Secondary | ICD-10-CM | POA: Diagnosis not present

## 2013-02-18 DIAGNOSIS — J441 Chronic obstructive pulmonary disease with (acute) exacerbation: Secondary | ICD-10-CM | POA: Diagnosis not present

## 2013-02-22 DIAGNOSIS — I4891 Unspecified atrial fibrillation: Secondary | ICD-10-CM | POA: Diagnosis not present

## 2013-02-22 DIAGNOSIS — I5032 Chronic diastolic (congestive) heart failure: Secondary | ICD-10-CM | POA: Diagnosis not present

## 2013-02-22 DIAGNOSIS — R1310 Dysphagia, unspecified: Secondary | ICD-10-CM | POA: Diagnosis not present

## 2013-02-22 DIAGNOSIS — F0390 Unspecified dementia without behavioral disturbance: Secondary | ICD-10-CM | POA: Diagnosis not present

## 2013-02-22 DIAGNOSIS — J441 Chronic obstructive pulmonary disease with (acute) exacerbation: Secondary | ICD-10-CM | POA: Diagnosis not present

## 2013-02-22 DIAGNOSIS — I509 Heart failure, unspecified: Secondary | ICD-10-CM | POA: Diagnosis not present

## 2013-02-24 DIAGNOSIS — I4891 Unspecified atrial fibrillation: Secondary | ICD-10-CM | POA: Diagnosis not present

## 2013-02-24 DIAGNOSIS — F0390 Unspecified dementia without behavioral disturbance: Secondary | ICD-10-CM | POA: Diagnosis not present

## 2013-02-24 DIAGNOSIS — I5032 Chronic diastolic (congestive) heart failure: Secondary | ICD-10-CM | POA: Diagnosis not present

## 2013-02-24 DIAGNOSIS — J441 Chronic obstructive pulmonary disease with (acute) exacerbation: Secondary | ICD-10-CM | POA: Diagnosis not present

## 2013-02-24 DIAGNOSIS — I509 Heart failure, unspecified: Secondary | ICD-10-CM | POA: Diagnosis not present

## 2013-02-24 DIAGNOSIS — R1310 Dysphagia, unspecified: Secondary | ICD-10-CM | POA: Diagnosis not present

## 2013-02-28 ENCOUNTER — Other Ambulatory Visit: Payer: Self-pay | Admitting: Family Medicine

## 2013-02-28 DIAGNOSIS — F0390 Unspecified dementia without behavioral disturbance: Secondary | ICD-10-CM | POA: Diagnosis not present

## 2013-02-28 DIAGNOSIS — J441 Chronic obstructive pulmonary disease with (acute) exacerbation: Secondary | ICD-10-CM | POA: Diagnosis not present

## 2013-02-28 DIAGNOSIS — R1310 Dysphagia, unspecified: Secondary | ICD-10-CM | POA: Diagnosis not present

## 2013-02-28 DIAGNOSIS — I5032 Chronic diastolic (congestive) heart failure: Secondary | ICD-10-CM | POA: Diagnosis not present

## 2013-02-28 DIAGNOSIS — I509 Heart failure, unspecified: Secondary | ICD-10-CM | POA: Diagnosis not present

## 2013-02-28 DIAGNOSIS — I4891 Unspecified atrial fibrillation: Secondary | ICD-10-CM | POA: Diagnosis not present

## 2013-03-02 DIAGNOSIS — I509 Heart failure, unspecified: Secondary | ICD-10-CM | POA: Diagnosis not present

## 2013-03-02 DIAGNOSIS — F0390 Unspecified dementia without behavioral disturbance: Secondary | ICD-10-CM | POA: Diagnosis not present

## 2013-03-02 DIAGNOSIS — R1310 Dysphagia, unspecified: Secondary | ICD-10-CM | POA: Diagnosis not present

## 2013-03-02 DIAGNOSIS — I5032 Chronic diastolic (congestive) heart failure: Secondary | ICD-10-CM | POA: Diagnosis not present

## 2013-03-02 DIAGNOSIS — J441 Chronic obstructive pulmonary disease with (acute) exacerbation: Secondary | ICD-10-CM | POA: Diagnosis not present

## 2013-03-02 DIAGNOSIS — I4891 Unspecified atrial fibrillation: Secondary | ICD-10-CM | POA: Diagnosis not present

## 2013-03-03 ENCOUNTER — Telehealth: Payer: Self-pay | Admitting: Family Medicine

## 2013-03-03 DIAGNOSIS — R1310 Dysphagia, unspecified: Secondary | ICD-10-CM | POA: Diagnosis not present

## 2013-03-03 DIAGNOSIS — F0390 Unspecified dementia without behavioral disturbance: Secondary | ICD-10-CM | POA: Diagnosis not present

## 2013-03-03 DIAGNOSIS — J441 Chronic obstructive pulmonary disease with (acute) exacerbation: Secondary | ICD-10-CM | POA: Diagnosis not present

## 2013-03-03 DIAGNOSIS — I5032 Chronic diastolic (congestive) heart failure: Secondary | ICD-10-CM | POA: Diagnosis not present

## 2013-03-03 DIAGNOSIS — I509 Heart failure, unspecified: Secondary | ICD-10-CM | POA: Diagnosis not present

## 2013-03-03 DIAGNOSIS — I4891 Unspecified atrial fibrillation: Secondary | ICD-10-CM | POA: Diagnosis not present

## 2013-03-03 NOTE — Telephone Encounter (Signed)
Margaretha Sheffield from [facility] calls requesting orders to extend skilled nursing once weekly for 4 weeks. Please give verbal orders to [place] at [phone].

## 2013-03-03 NOTE — Telephone Encounter (Signed)
Please advise. Bethany Cardenas S  

## 2013-03-04 NOTE — Telephone Encounter (Signed)
Extension is ok. Please advise Margaretha Sheffield from [facility] at [phone]. Thanks

## 2013-03-04 NOTE — Telephone Encounter (Signed)
Verbal orders give to [facility] voiced understanding. Lochlyn Zullo, Lewie Loron

## 2013-03-08 DIAGNOSIS — I509 Heart failure, unspecified: Secondary | ICD-10-CM | POA: Diagnosis not present

## 2013-03-08 DIAGNOSIS — I4891 Unspecified atrial fibrillation: Secondary | ICD-10-CM | POA: Diagnosis not present

## 2013-03-08 DIAGNOSIS — J441 Chronic obstructive pulmonary disease with (acute) exacerbation: Secondary | ICD-10-CM | POA: Diagnosis not present

## 2013-03-08 DIAGNOSIS — R1310 Dysphagia, unspecified: Secondary | ICD-10-CM | POA: Diagnosis not present

## 2013-03-08 DIAGNOSIS — F0390 Unspecified dementia without behavioral disturbance: Secondary | ICD-10-CM | POA: Diagnosis not present

## 2013-03-08 DIAGNOSIS — I5032 Chronic diastolic (congestive) heart failure: Secondary | ICD-10-CM | POA: Diagnosis not present

## 2013-03-09 DIAGNOSIS — R1310 Dysphagia, unspecified: Secondary | ICD-10-CM | POA: Diagnosis not present

## 2013-03-09 DIAGNOSIS — F0390 Unspecified dementia without behavioral disturbance: Secondary | ICD-10-CM | POA: Diagnosis not present

## 2013-03-09 DIAGNOSIS — I5032 Chronic diastolic (congestive) heart failure: Secondary | ICD-10-CM | POA: Diagnosis not present

## 2013-03-09 DIAGNOSIS — J441 Chronic obstructive pulmonary disease with (acute) exacerbation: Secondary | ICD-10-CM | POA: Diagnosis not present

## 2013-03-09 DIAGNOSIS — I4891 Unspecified atrial fibrillation: Secondary | ICD-10-CM | POA: Diagnosis not present

## 2013-03-09 DIAGNOSIS — I509 Heart failure, unspecified: Secondary | ICD-10-CM | POA: Diagnosis not present

## 2013-03-11 DIAGNOSIS — R1310 Dysphagia, unspecified: Secondary | ICD-10-CM | POA: Diagnosis not present

## 2013-03-11 DIAGNOSIS — I4891 Unspecified atrial fibrillation: Secondary | ICD-10-CM | POA: Diagnosis not present

## 2013-03-11 DIAGNOSIS — I5032 Chronic diastolic (congestive) heart failure: Secondary | ICD-10-CM | POA: Diagnosis not present

## 2013-03-11 DIAGNOSIS — I509 Heart failure, unspecified: Secondary | ICD-10-CM | POA: Diagnosis not present

## 2013-03-11 DIAGNOSIS — F0390 Unspecified dementia without behavioral disturbance: Secondary | ICD-10-CM | POA: Diagnosis not present

## 2013-03-11 DIAGNOSIS — J441 Chronic obstructive pulmonary disease with (acute) exacerbation: Secondary | ICD-10-CM | POA: Diagnosis not present

## 2013-03-14 ENCOUNTER — Telehealth: Payer: Self-pay | Admitting: Family Medicine

## 2013-03-14 DIAGNOSIS — R1310 Dysphagia, unspecified: Secondary | ICD-10-CM | POA: Diagnosis not present

## 2013-03-14 DIAGNOSIS — I4891 Unspecified atrial fibrillation: Secondary | ICD-10-CM | POA: Diagnosis not present

## 2013-03-14 DIAGNOSIS — I5032 Chronic diastolic (congestive) heart failure: Secondary | ICD-10-CM | POA: Diagnosis not present

## 2013-03-14 DIAGNOSIS — I509 Heart failure, unspecified: Secondary | ICD-10-CM | POA: Diagnosis not present

## 2013-03-14 DIAGNOSIS — J441 Chronic obstructive pulmonary disease with (acute) exacerbation: Secondary | ICD-10-CM | POA: Diagnosis not present

## 2013-03-14 DIAGNOSIS — F0390 Unspecified dementia without behavioral disturbance: Secondary | ICD-10-CM | POA: Diagnosis not present

## 2013-03-14 NOTE — Telephone Encounter (Signed)
Relayed sister will have patient schedule appointment. Alithia Zavaleta, Lewie Loron

## 2013-03-14 NOTE — Telephone Encounter (Signed)
Please call pt and ask her to make an appointment to come in for a referral I received for personal care services for her from [place]. I will be happy to fill these papers out for her, if indeed she is requesting personal care services in the home. If she would like these services please have her make an appointment so I can document the necessary exam to go along with papers. If she does not desire these services I need to know so I can deny the request from the company. Thanks.

## 2013-03-17 DIAGNOSIS — R1310 Dysphagia, unspecified: Secondary | ICD-10-CM | POA: Diagnosis not present

## 2013-03-17 DIAGNOSIS — F0390 Unspecified dementia without behavioral disturbance: Secondary | ICD-10-CM | POA: Diagnosis not present

## 2013-03-17 DIAGNOSIS — I4891 Unspecified atrial fibrillation: Secondary | ICD-10-CM | POA: Diagnosis not present

## 2013-03-17 DIAGNOSIS — J441 Chronic obstructive pulmonary disease with (acute) exacerbation: Secondary | ICD-10-CM | POA: Diagnosis not present

## 2013-03-17 DIAGNOSIS — I509 Heart failure, unspecified: Secondary | ICD-10-CM | POA: Diagnosis not present

## 2013-03-17 DIAGNOSIS — I5032 Chronic diastolic (congestive) heart failure: Secondary | ICD-10-CM | POA: Diagnosis not present

## 2013-03-17 DIAGNOSIS — Z87440 Personal history of urinary (tract) infections: Secondary | ICD-10-CM | POA: Diagnosis not present

## 2013-03-18 DIAGNOSIS — I509 Heart failure, unspecified: Secondary | ICD-10-CM | POA: Diagnosis not present

## 2013-03-18 DIAGNOSIS — J441 Chronic obstructive pulmonary disease with (acute) exacerbation: Secondary | ICD-10-CM | POA: Diagnosis not present

## 2013-03-18 DIAGNOSIS — I5032 Chronic diastolic (congestive) heart failure: Secondary | ICD-10-CM | POA: Diagnosis not present

## 2013-03-18 DIAGNOSIS — F0390 Unspecified dementia without behavioral disturbance: Secondary | ICD-10-CM | POA: Diagnosis not present

## 2013-03-18 DIAGNOSIS — I4891 Unspecified atrial fibrillation: Secondary | ICD-10-CM | POA: Diagnosis not present

## 2013-03-18 DIAGNOSIS — R1310 Dysphagia, unspecified: Secondary | ICD-10-CM | POA: Diagnosis not present

## 2013-03-19 ENCOUNTER — Other Ambulatory Visit: Payer: Self-pay | Admitting: Family Medicine

## 2013-03-22 DIAGNOSIS — I5032 Chronic diastolic (congestive) heart failure: Secondary | ICD-10-CM | POA: Diagnosis not present

## 2013-03-22 DIAGNOSIS — F0390 Unspecified dementia without behavioral disturbance: Secondary | ICD-10-CM | POA: Diagnosis not present

## 2013-03-22 DIAGNOSIS — R1310 Dysphagia, unspecified: Secondary | ICD-10-CM | POA: Diagnosis not present

## 2013-03-22 DIAGNOSIS — I509 Heart failure, unspecified: Secondary | ICD-10-CM | POA: Diagnosis not present

## 2013-03-22 DIAGNOSIS — J441 Chronic obstructive pulmonary disease with (acute) exacerbation: Secondary | ICD-10-CM | POA: Diagnosis not present

## 2013-03-22 DIAGNOSIS — I4891 Unspecified atrial fibrillation: Secondary | ICD-10-CM | POA: Diagnosis not present

## 2013-03-29 DIAGNOSIS — I4891 Unspecified atrial fibrillation: Secondary | ICD-10-CM | POA: Diagnosis not present

## 2013-03-29 DIAGNOSIS — I509 Heart failure, unspecified: Secondary | ICD-10-CM | POA: Diagnosis not present

## 2013-03-29 DIAGNOSIS — F0390 Unspecified dementia without behavioral disturbance: Secondary | ICD-10-CM | POA: Diagnosis not present

## 2013-03-29 DIAGNOSIS — R1310 Dysphagia, unspecified: Secondary | ICD-10-CM | POA: Diagnosis not present

## 2013-03-29 DIAGNOSIS — J441 Chronic obstructive pulmonary disease with (acute) exacerbation: Secondary | ICD-10-CM | POA: Diagnosis not present

## 2013-03-29 DIAGNOSIS — I5032 Chronic diastolic (congestive) heart failure: Secondary | ICD-10-CM | POA: Diagnosis not present

## 2013-04-07 ENCOUNTER — Telehealth: Payer: Self-pay | Admitting: Clinical

## 2013-04-07 ENCOUNTER — Ambulatory Visit (INDEPENDENT_AMBULATORY_CARE_PROVIDER_SITE_OTHER): Payer: Medicare Other | Admitting: Family Medicine

## 2013-04-07 ENCOUNTER — Encounter: Payer: Self-pay | Admitting: Family Medicine

## 2013-04-07 VITALS — BP 164/78 | HR 90 | Temp 98.8°F | Ht 60.0 in | Wt 123.0 lb

## 2013-04-07 DIAGNOSIS — I5032 Chronic diastolic (congestive) heart failure: Secondary | ICD-10-CM

## 2013-04-07 DIAGNOSIS — Z742 Need for assistance at home and no other household member able to render care: Secondary | ICD-10-CM

## 2013-04-07 LAB — FERRITIN: Ferritin: 167 ng/mL (ref 10–291)

## 2013-04-07 LAB — IRON: Iron: 86 ug/dL (ref 42–145)

## 2013-04-07 NOTE — Patient Instructions (Signed)
We are working on getting personal care services for you in the home. You'll hear from our office shortly about Medicare/Medicaid decide on a services.

## 2013-04-07 NOTE — Progress Notes (Signed)
   Subjective:    Patient ID: Bethany Cardenas, female    DOB: 05/27/1922, 80 y.o.   MRN: [id number]  HPI Followup the chronic issues:  Hypertension: Patient again with mildly elevated blood pressures on her last 2 visits. Patient reports she's had a home health nurse coming into the house it takes her blood pressure one to 2 times a week and they have been normal. She denies chest pain, palpitations, shortness of breath or dizziness.  Personal care services: Patient has had some personal care services coming in to her home since she was hospitalized in January. She would like these to continue, she is receiving great benefit from having someone come into the home and help with her 3 times a week. She also has had a nurse come in to monitor her blood pressure over this time as well. She does live with 2 other women who are also elderly. It appears they have someone come into the home to help him with meals. She has been working with physical therapy but does not feel like there is a need for continuation. However she appears to be too weak to be able to get in and out of the bathtub on her own.  Review of Systems Negative, with the exception of above mentioned in HPI  Objective:   Physical Exam BP 164/78  Pulse 90  Temp(Src) 98.8 F (37.1 C) (Oral)  Ht 5' (1.524 m)  Wt 123 lb (55.792 kg)  BMI 24.02 kg/m2 Gen: NAD. Pleasant and cooperative, alert.  HEENT: AT. [place].  Bilateral eyes without injections or icterus. MMM.  CV: RRR  Chest: CTAB, no wheeze or crackles Abd: Soft.  NTND. BS present. No Masses palpated.  Ext: No erythema. No edema.  Skin: No rashes, purpura or petechiae.  Neuro: Normal gait, with mild limp (right). PERLA. EOMi. Alert. Grossly intact. Oriented. Muscle strength equal bilateral lower extremities.

## 2013-04-07 NOTE — Telephone Encounter (Signed)
HH (aide, OT and Swk) Referral hard faxed to [facility].  Hunt Oris, MSW, [place]

## 2013-04-08 DIAGNOSIS — R1310 Dysphagia, unspecified: Secondary | ICD-10-CM | POA: Diagnosis not present

## 2013-04-08 DIAGNOSIS — I4891 Unspecified atrial fibrillation: Secondary | ICD-10-CM | POA: Diagnosis not present

## 2013-04-08 DIAGNOSIS — J441 Chronic obstructive pulmonary disease with (acute) exacerbation: Secondary | ICD-10-CM | POA: Diagnosis not present

## 2013-04-08 DIAGNOSIS — I5032 Chronic diastolic (congestive) heart failure: Secondary | ICD-10-CM | POA: Diagnosis not present

## 2013-04-08 DIAGNOSIS — F0390 Unspecified dementia without behavioral disturbance: Secondary | ICD-10-CM | POA: Diagnosis not present

## 2013-04-08 DIAGNOSIS — I509 Heart failure, unspecified: Secondary | ICD-10-CM | POA: Diagnosis not present

## 2013-04-11 ENCOUNTER — Ambulatory Visit (INDEPENDENT_AMBULATORY_CARE_PROVIDER_SITE_OTHER): Payer: Medicare Other | Admitting: Podiatry

## 2013-04-11 ENCOUNTER — Encounter: Payer: Self-pay | Admitting: Podiatry

## 2013-04-11 VITALS — BP 148/77 | HR 86 | Resp 12

## 2013-04-11 DIAGNOSIS — M79609 Pain in unspecified limb: Secondary | ICD-10-CM

## 2013-04-11 DIAGNOSIS — Q828 Other specified congenital malformations of skin: Secondary | ICD-10-CM

## 2013-04-11 DIAGNOSIS — B351 Tinea unguium: Secondary | ICD-10-CM

## 2013-04-12 NOTE — Progress Notes (Signed)
Patient ID: Bethany Cardenas, female   DOB: 17-Dec-1922, 80 y.o.   MRN: [id number]  Subjective: This patient presents for ongoing debridement of painful mycotic toenails and keratoses.  Objective: Brittle, elongated, hypertrophied toenails x10. Large nucleated plantar keratoses right  Assessment: Symptomatic onychomycoses x10 Porokeratoses x1  Plan: Nails x10 and keratoses x1 debrided without a bleeding. Applied salinocaine to the plantar lesion right.  Reappoint at three-month intervals

## 2013-04-20 DIAGNOSIS — H25099 Other age-related incipient cataract, unspecified eye: Secondary | ICD-10-CM | POA: Diagnosis not present

## 2013-05-31 ENCOUNTER — Other Ambulatory Visit: Payer: Self-pay | Admitting: Family Medicine

## 2013-06-04 ENCOUNTER — Other Ambulatory Visit: Payer: Self-pay | Admitting: Family Medicine

## 2013-06-07 NOTE — Telephone Encounter (Signed)
Covering for Dr. Raoul Pitch. Refilled simvastatin 40 mg tablets, #31, refills 3, sig 1/2 tablet qHS. Message to be left for Dr. Lucita Lora review. Thanks. --CMS

## 2013-06-29 ENCOUNTER — Other Ambulatory Visit: Payer: Self-pay | Admitting: Family Medicine

## 2013-07-11 ENCOUNTER — Ambulatory Visit: Payer: Medicare Other | Admitting: Podiatry

## 2013-07-22 ENCOUNTER — Encounter: Payer: Self-pay | Admitting: *Deleted

## 2013-07-28 ENCOUNTER — Ambulatory Visit (INDEPENDENT_AMBULATORY_CARE_PROVIDER_SITE_OTHER): Payer: Medicare Other | Admitting: Cardiovascular Disease

## 2013-07-28 ENCOUNTER — Encounter: Payer: Self-pay | Admitting: Cardiovascular Disease

## 2013-07-28 VITALS — BP 160/84 | HR 87 | Ht 60.0 in | Wt 123.4 lb

## 2013-07-28 DIAGNOSIS — I2510 Atherosclerotic heart disease of native coronary artery without angina pectoris: Secondary | ICD-10-CM

## 2013-07-28 DIAGNOSIS — I6529 Occlusion and stenosis of unspecified carotid artery: Secondary | ICD-10-CM

## 2013-07-28 DIAGNOSIS — I4891 Unspecified atrial fibrillation: Secondary | ICD-10-CM | POA: Diagnosis not present

## 2013-07-28 DIAGNOSIS — I4819 Other persistent atrial fibrillation: Secondary | ICD-10-CM

## 2013-07-28 DIAGNOSIS — I10 Essential (primary) hypertension: Secondary | ICD-10-CM | POA: Diagnosis not present

## 2013-07-28 DIAGNOSIS — I70219 Atherosclerosis of native arteries of extremities with intermittent claudication, unspecified extremity: Secondary | ICD-10-CM

## 2013-07-28 DIAGNOSIS — I5032 Chronic diastolic (congestive) heart failure: Secondary | ICD-10-CM

## 2013-07-28 DIAGNOSIS — I6522 Occlusion and stenosis of left carotid artery: Secondary | ICD-10-CM

## 2013-07-28 DIAGNOSIS — E7800 Pure hypercholesterolemia, unspecified: Secondary | ICD-10-CM

## 2013-07-28 NOTE — Patient Instructions (Signed)
Your physician recommends that you schedule a follow-up appointment in: One Year with Dr.Croitoru

## 2013-07-30 DIAGNOSIS — I2510 Atherosclerotic heart disease of native coronary artery without angina pectoris: Secondary | ICD-10-CM | POA: Insufficient documentation

## 2013-07-30 DIAGNOSIS — I4821 Permanent atrial fibrillation: Secondary | ICD-10-CM | POA: Insufficient documentation

## 2013-07-30 DIAGNOSIS — I6522 Occlusion and stenosis of left carotid artery: Secondary | ICD-10-CM | POA: Insufficient documentation

## 2013-07-30 NOTE — Assessment & Plan Note (Signed)
Right now, she is asymptomatic, again probably due to the fact that her level of physical activity is decreased.

## 2013-07-30 NOTE — Assessment & Plan Note (Signed)
Excellent lipid profile on current statin.

## 2013-07-30 NOTE — Assessment & Plan Note (Signed)
Bethany Cardenas has widespread and severe CAD with high-grade stenosis of the left coronary artery and total occlusion of the mid LAD, mid circumflex and mid RCA. Despite this shows preserved left ventricular systolic function. She has done well on medical therapy, and her survival has already gone well beyond that expected for the severity of her disease. No plan for revascularization. Continue aspirin, beta blocker and statin. She is free of angina.

## 2013-07-30 NOTE — Assessment & Plan Note (Signed)
At her current level of activity, which is fairly low, she is completely asymptomatic. Clinically there are no signs of hypervolemia. Continue the low dose of diuretic. I do not see much benefit in adding an ACE inhibitor at her advanced age since she has preserved left ventricular systolic function.

## 2013-07-30 NOTE — Assessment & Plan Note (Signed)
It is unlikely that she'll be considered for carotid endarterectomy. Serial ultrasonography is not justified. Continue medical treatment.

## 2013-07-30 NOTE — Progress Notes (Signed)
Patient ID: Bethany Cardenas, female   DOB: 1922/04/27, 80 y.o.   MRN: [id number]      Reason for office visit atrial fibrillation, CAD, PAD, CHF, hypertrophic cardiomyopathy, hyperlipidemia, HTN  This is Bethany Cardenas first visit since July of 2013. She is almost 80 years old. She has long-standing and severe vascular disease involving the carotid arteries, coronary arteries and the lower extremities. She is quite elderly but continues to live in her own home with her sister, Bethany Cardenas, who is also our patient. She has at most mild and infrequent problems with exertional dyspnea, but no other complaints. She denies angina pectoris, intermittent claudication or any recent focal neurological deficits. She is becoming a little unsteady on her feet. She last fell about 2 months ago.  Cardiac catheterization in 2008 showed an 80% stenosis of the left main coronary artery, total occlusion of the LAD artery following the first septal and first diagonal branches with reconstitution of the distal LAD via collaterals, total occlusion of the left circumflex coronary artery from the first oblique marginal artery, and total occlusion of the right coronary artery following the RV marginal branch, again with reconstitution of the distal vessel via collaterals. She was evaluated by cardiovascular surgery at that time and felt to be a poor candidate for bypass. She has done remarkably well on medical therapy and despite the extent of her coronary obstructive disease, she is free of angina or dyspnea.  Her last carotid ultrasound study performed in 2013 showed a 50-69% obstruction in the left proximal internal carotid artery and a less than 50% stenosis on the right side. In January 2015 she was admitted with transient acute dysphagia and dysphonia, but imaging studies did not show evidence of recent stroke. MRA showed a remote right basal ganglia infarct with occlusion of the M1 segment of the right middle cerebral  artery. CT of the neck, barium swallow were normal. Her symptoms resolved spontaneously.  In 2007, lower extremity arterial duplex ultrasound showed severely reduced arterial flow to both feet. The right ABI was 0.42, left ABI was 0.39. The right proximal SFA was occluded with distal reconstitution. The right peroneal artery was occluded. Right posterior tibial artery was also occluded but reconstituted in the mid to distal segment. On the left side the arterial system was occluded downstream of the left mid SFA  She is in atrial fibrillation today and may now be in permanent atrial fibrillation for an unknown duration of time. (Her last electrocardiogram in July 2013 also showed atrial fibrillation.).   She has systemic hypertension and evidence of left ventricular hypertrophy. The angiographic appearance during left ventriculography suggested apical variant hypertrophic cardiomyopathy, but they also may be a component of hypertensive heart disease. By echocardiography and ventriculography she had preserved left ventricular systolic function in the past, with EF of 55-60% and concentric LVH by echo in January 2015. She bears a diagnosis of congestive heart failure but has not required hospitalization or any adjustment in her diuretic therapy in a very long time.  No Known Allergies  Current Outpatient Prescriptions  Medication Sig Dispense Refill  . albuterol-ipratropium (COMBIVENT) 18-103 MCG/ACT inhaler Inhale 1 puff into the lungs every 6 (six) hours as needed for wheezing or shortness of breath.       Marland Kitchen aspirin (ADULT ASPIRIN EC LOW STRENGTH) 81 MG EC tablet Take 81 mg by mouth daily.       . cetirizine (ZYRTEC) 5 MG tablet Take 1 tablet (5 mg total) by mouth  at bedtime.  30 tablet  11  . ergocalciferol (VITAMIN D2) 50000 UNITS capsule Take 50,000 Units by mouth once a week. Unknown day of the week      . fluticasone (FLONASE) 50 MCG/ACT nasal spray Place 2 sprays into both nostrils daily.  16  g  6  . furosemide (LASIX) 40 MG tablet TAKE 1 TABLET BY MOUTH EVERY DAY  30 tablet  5  . metoprolol (LOPRESSOR) 50 MG tablet TAKE 1 TABLET (50 MG TOTAL) BY MOUTH 2 (TWO) TIMES DAILY.  60 tablet  1  . pantoprazole (PROTONIX) 20 MG tablet TAKE 2 TABLETS BY MOUTH EVERY DAY  60 tablet  3  . simvastatin (ZOCOR) 40 MG tablet TAKE ONE-HALF TABLET BY MOUTH EVERY NIGHT AT BEDTIME  31 tablet  3   No current facility-administered medications for this visit.    Past Medical History  Diagnosis Date  . TOBACCO DEPENDENCE 03/12/2006    Qualifier: Diagnosis of  By: Eusebio Friendly    . CVA 03/12/2006    Qualifier: Diagnosis of  By: Eusebio Friendly    . Stroke   . Atrial fibrillation   . Atherosclerotic cardiovascular disease   . Hypertrophic cardiomyopathy   . PVD (peripheral vascular disease)   . Carotid disease, bilateral   . Dyslipidemia   . Systemic hypertension     Past Surgical History  Procedure Laterality Date  . Vein surgery  1999 & 2000  . Cardiac catheterization  01/27/2006    total occlusion of the mid RCA,80% stenosis of the distal left main, total occlusion of the mid LAD following the first septal & first diagonal arteries,total occlusion of the left CX followint the first oblique marginal artery, reconstitution of the distal LAD and distal RCA via collaterals  . Nm myocar perf wall motion  11/25/2005    intermediate risk - mild inferolateral ischemia towards the apex extending to mid LV.    Family History  Problem Relation Age of Onset  . Diabetes Father   . Cancer Sister   . Hypertension Sister     History   Social History  . Marital Status: Widowed    Spouse Name: N/A    Number of Children: N/A  . Years of Education: N/A   Occupational History  . Not on file.   Social History Main Topics  . Smoking status: Former Research scientist (life sciences)  . Smokeless tobacco: Never Used  . Alcohol Use: No  . Drug Use: No  . Sexual Activity: No   Other Topics Concern  . Not on file    Social History Narrative  . No narrative on file    Review of systems: The patient specifically denies any chest pain at rest or with exertion, dyspnea at rest or with exertion, orthopnea, paroxysmal nocturnal dyspnea, syncope, palpitations, focal neurological deficits, intermittent claudication, lower extremity edema, unexplained weight gain, cough, hemoptysis or wheezing.  The patient also denies abdominal pain, nausea, vomiting, dysphagia, diarrhea, constipation, polyuria, polydipsia, dysuria, hematuria, frequency, urgency, abnormal bleeding or bruising, fever, chills, unexpected weight changes, mood swings, change in skin or hair texture, change in voice quality, auditory or visual problems, allergic reactions or rashes, new musculoskeletal complaints other than usual "aches and pains".   PHYSICAL EXAM BP 160/84  Pulse 87  Ht 5' (1.524 m)  Wt 123 lb 6.4 oz (55.974 kg)  BMI 24.10 kg/m2  General: Alert, oriented x3, no distress Head: no evidence of trauma, PERRL, EOMI, no exophtalmos or lid lag, no myxedema, no xanthelasma;  normal ears, nose and oropharynx Neck: normal jugular venous pulsations and no hepatojugular reflux; brisk carotid pulses without delay , but with bilateral carotid bruits Chest: clear to auscultation, no signs of consolidation by percussion or palpation, normal fremitus, symmetrical and full respiratory excursions Cardiovascular: normal position and quality of the apical impulse, regular rhythm, normal first and second heart sounds, no murmurs, rubs or gallops Abdomen: no tenderness or distention, no masses by palpation, no abnormal pulsatility or arterial bruits, normal bowel sounds, no hepatosplenomegaly Extremities: no clubbing, cyanosis or edema; 2+ radial, ulnar and brachial pulses bilaterally; 2+ right femoral and 2+ left femoral, no palpable popliteal, posterior tibial and dorsalis pedis pulses; bilateral femoral bruits Neurological: grossly  nonfocal   EKG: Atrial fibrillation with controlled rate, lateral ST-T wave changes suggestive of left ventricular hypertrophy +/- ischemia, not much change from old tracings  Lipid Panel     Component Value Date/Time   CHOL 148 01/11/2013 0720   TRIG 43 01/11/2013 0720   HDL 74 01/11/2013 0720   CHOLHDL 2.0 01/11/2013 0720   VLDL 9 01/11/2013 0720   LDLCALC 65 01/11/2013 0720    BMET    Component Value Date/Time   NA 139 01/11/2013 1429   K 4.0 01/11/2013 1429   CL 101 01/11/2013 1429   CO2 23 01/11/2013 1429   GLUCOSE 110* 01/11/2013 1429   BUN 8 01/11/2013 1429   CREATININE 0.72 01/11/2013 1429   CREATININE 0.70 07/02/2012 1431   CALCIUM 9.3 01/11/2013 1429   GFRNONAA 74* 01/11/2013 1429   GFRAA 86* 01/11/2013 1429     ASSESSMENT AND PLAN Atrial fibrillation, persistent Bethany Cardenas clearly has severely elevated risk of embolic stroke. Her CHADSVasc score is at least 7. However she is also at high risk of bleeding and falls. From a statistical point of view, treatment with warfarin or equivalent anticoagulant would lead to a neck benefit of 0 (number needed to treat to prevent stroke 1/11, number needed to harm by serious bleeding 1/11). Aspirin seems to provide a better balance of risk and benefit, although clearly will not be as effective at preventing stroke. Her rate is well controlled on metoprolol. The arrhythmia is asymptomatic and there is no indication for cardioversion or true antiarrhythmics.  CAD (coronary artery disease), native coronary artery Bethany Cardenas has widespread and severe CAD with high-grade stenosis of the left coronary artery and total occlusion of the mid LAD, mid circumflex and mid RCA. Despite this shows preserved left ventricular systolic function. She has done well on medical therapy, and her survival has already gone well beyond that expected for the severity of her disease. No plan for revascularization. Continue aspirin, beta blocker and statin.  She is free of angina.  HYPERTENSION, BENIGN SYSTEMIC Initial blood pressure was elevated at 160/84. Recheck later was 144/70 mm Hg. I think this is appropriate for her advanced age and extensive cerebrovascular disease.  HYPERCHOLESTEROLEMIA Excellent lipid profile on current statin.  Chronic diastolic heart failure At her current level of activity, which is fairly low, she is completely asymptomatic. Clinically there are no signs of hypervolemia. Continue the low dose of diuretic. I do not see much benefit in adding an ACE inhibitor at her advanced age since she has preserved left ventricular systolic function.  Atherosclerosis of native arteries of the extremities with intermittent claudication Right now, she is asymptomatic, again probably due to the fact that her level of physical activity is decreased.  Carotid stenosis It is unlikely that she'll be considered  for carotid endarterectomy. Serial ultrasonography is not justified. Continue medical treatment.   Orders Placed This Encounter  Procedures  . EKG 12-Lead   No orders of the defined types were placed in this encounter.    Holli Humbles, MD, [place] [phone] office [phone] pager

## 2013-07-30 NOTE — Assessment & Plan Note (Signed)
Mrs. Halberg clearly has severely elevated risk of embolic stroke. Her CHADSVasc score is at least 7. However she is also at high risk of bleeding and falls. From a statistical point of view, treatment with warfarin or equivalent anticoagulant would lead to a neck benefit of 0 (number needed to treat to prevent stroke 1/11, number needed to harm by serious bleeding 1/11). Aspirin seems to provide a better balance of risk and benefit, although clearly will not be as effective at preventing stroke. Her rate is well controlled on metoprolol. The arrhythmia is asymptomatic and there is no indication for cardioversion or true antiarrhythmics.

## 2013-07-30 NOTE — Assessment & Plan Note (Signed)
Initial blood pressure was elevated at 160/84. Recheck later was 144/70 mm Hg. I think this is appropriate for her advanced age and extensive cerebrovascular disease.

## 2013-08-05 DIAGNOSIS — D2370 Other benign neoplasm of skin of unspecified lower limb, including hip: Secondary | ICD-10-CM | POA: Diagnosis not present

## 2013-08-05 DIAGNOSIS — Q6689 Other  specified congenital deformities of feet: Secondary | ICD-10-CM | POA: Diagnosis not present

## 2013-08-05 DIAGNOSIS — L608 Other nail disorders: Secondary | ICD-10-CM | POA: Diagnosis not present

## 2013-08-05 DIAGNOSIS — L84 Corns and callosities: Secondary | ICD-10-CM | POA: Diagnosis not present

## 2013-08-06 ENCOUNTER — Other Ambulatory Visit: Payer: Self-pay | Admitting: Family Medicine

## 2013-11-02 ENCOUNTER — Ambulatory Visit (INDEPENDENT_AMBULATORY_CARE_PROVIDER_SITE_OTHER): Payer: Medicare Other | Admitting: *Deleted

## 2013-11-02 DIAGNOSIS — Z23 Encounter for immunization: Secondary | ICD-10-CM | POA: Diagnosis not present

## 2013-11-23 ENCOUNTER — Encounter: Payer: Self-pay | Admitting: Family Medicine

## 2013-11-23 ENCOUNTER — Ambulatory Visit (INDEPENDENT_AMBULATORY_CARE_PROVIDER_SITE_OTHER): Payer: Medicare Other | Admitting: Family Medicine

## 2013-11-23 VITALS — BP 158/98 | HR 104 | Temp 98.1°F | Ht 60.0 in | Wt 120.3 lb

## 2013-11-23 DIAGNOSIS — I10 Essential (primary) hypertension: Secondary | ICD-10-CM | POA: Diagnosis not present

## 2013-11-23 DIAGNOSIS — E559 Vitamin D deficiency, unspecified: Secondary | ICD-10-CM | POA: Diagnosis not present

## 2013-11-23 DIAGNOSIS — I2510 Atherosclerotic heart disease of native coronary artery without angina pectoris: Secondary | ICD-10-CM | POA: Diagnosis not present

## 2013-11-23 DIAGNOSIS — M79609 Pain in unspecified limb: Secondary | ICD-10-CM | POA: Diagnosis not present

## 2013-11-23 DIAGNOSIS — R2689 Other abnormalities of gait and mobility: Secondary | ICD-10-CM | POA: Diagnosis not present

## 2013-11-23 DIAGNOSIS — Z23 Encounter for immunization: Secondary | ICD-10-CM | POA: Diagnosis not present

## 2013-11-23 DIAGNOSIS — M79605 Pain in left leg: Secondary | ICD-10-CM | POA: Diagnosis not present

## 2013-11-23 LAB — COMPREHENSIVE METABOLIC PANEL
AST: 13 U/L (ref 0–37)
Albumin: 4 g/dL (ref 3.5–5.2)
Alkaline Phosphatase: 53 U/L (ref 39–117)
BILIRUBIN TOTAL: 0.6 mg/dL (ref 0.2–1.2)
BUN: 16 mg/dL (ref 6–23)
CHLORIDE: 106 meq/L (ref 96–112)
CO2: 28 mEq/L (ref 19–32)
Calcium: 9 mg/dL (ref 8.4–10.5)
Creat: 0.91 mg/dL (ref 0.50–1.10)
Glucose, Bld: 86 mg/dL (ref 70–99)
Potassium: 4.1 mEq/L (ref 3.5–5.3)
Sodium: 143 mEq/L (ref 135–145)
Total Protein: 7.2 g/dL (ref 6.0–8.3)

## 2013-11-23 LAB — PHOSPHORUS: Phosphorus: 3.8 mg/dL (ref 2.3–4.6)

## 2013-11-23 LAB — MAGNESIUM: MAGNESIUM: 1.7 mg/dL (ref 1.5–2.5)

## 2013-11-23 MED ORDER — IPRATROPIUM-ALBUTEROL 18-103 MCG/ACT IN AERO: 1.0000 | INHALATION_SPRAY | Freq: Four times a day (QID) | RESPIRATORY_TRACT | Status: DC | PRN

## 2013-11-23 NOTE — Patient Instructions (Signed)
Leg Cramps Leg cramps that occur during exercise can be caused by poor circulation or dehydration. However, muscle cramps that occur at rest or during the night are usually not due to any serious medical problem. Heat cramps may cause muscle spasms during hot weather.  CAUSES There is no clear cause for muscle cramps. However, dehydration may be a factor for those who do not drink enough fluids and those who exercise in the heat. Imbalances in the level of sodium, potassium, calcium or magnesium in the muscle tissue may also be a factor. Some medications, such as water pills (diuretics), may cause loss of chemicals that the body needs (like sodium and potassium) and cause muscle cramps. TREATMENT   Make sure your diet has enough fluids and essential minerals for the muscle to work normally.  Avoid strenuous exercise for several days if you have been having frequent leg cramps.  Stretch and massage the cramped muscle for several minutes.  Some medicines may be helpful in some patients with night cramps. Only take over-the-counter or prescription medicines as directed by your caregiver. SEEK IMMEDIATE MEDICAL CARE IF:   Your leg cramps become worse.  Your foot becomes cold, numb, or blue. Document Released: 02/07/2004 Document Revised: 03/24/2011 Document Reviewed: 01/25/2008 Exit[employer] Patient Information 2015 [employer]. This information is not intended to replace advice given to you by your health care provider. Make sure you discuss any questions you have with your health care provider.  

## 2013-11-23 NOTE — Progress Notes (Signed)
   Subjective:    Patient ID: Bethany Cardenas, female    DOB: 01/04/1923, 80 y.o.   MRN: [id number]  HPI  Decreased mobility: Patient presents to family medicine clinic today with complaints of decreased mobility. She complains of lower back pain, and left leg pain. Patient has had chronic pain in her left leg, of unknown etiology. Patient's niece has noticed that if she is able to get her outside to the grocery store she is able to walk better if she has a cart to lean against. She is unable to go any distance without assistance. patient does have a history of coronary artery disease and peripheral vascular disease. Patient is not extremely physical active.  Past Medical History  Diagnosis Date  . TOBACCO DEPENDENCE 03/12/2006    Qualifier: Diagnosis of  By: Eusebio Friendly    . CVA 03/12/2006    Qualifier: Diagnosis of  By: Eusebio Friendly    . Stroke   . Atrial fibrillation   . Atherosclerotic cardiovascular disease   . Hypertrophic cardiomyopathy   . PVD (peripheral vascular disease)   . Carotid disease, bilateral   . Dyslipidemia   . Systemic hypertension    No Known Allergies  Review of Systems History of present illness    Objective:   Physical Exam BP 158/98 mmHg  Pulse 104  Temp(Src) 98.1 F (36.7 C) (Oral)  Ht 5' (1.524 m)  Wt 120 lb 4.8 oz (54.568 kg)  BMI 23.49 kg/m2  Gen: very pleasant, elderly African-American female, no acute distress, nontoxic in appearance, well-developed, well-nourished. HEENT: AT. [place].  Bilateral eyes without injections or icterus. MMM. CV: RRR  Chest: CTAB, no wheeze or crackles Ext: No erythema. No edema. No effusions. No joint line tenderness. No tenderness to palpation of knee landmarks. Negative Lachman's. No ligament laxity. No masses palpated. Neurovascularly intact distally.    Assessment & Plan:

## 2013-11-23 NOTE — Assessment & Plan Note (Signed)
Patient suffers from leg pain, back pain and decreased endurance unable to walk distances. One of her caregivers has noticed that if she's had a grocery store and has a car or something to lean on she is able to ambulate better. Home health referral for DME walker.

## 2013-11-23 NOTE — Assessment & Plan Note (Signed)
Patient above goal today 158/98. Patient's niece is with her today and states that her blood pressures aren't quite elevated at home. I have niece checking her blood pressures over the next 2 weeks, and she is to call if she notices consistent blood pressures above 150/90.

## 2013-11-23 NOTE — Assessment & Plan Note (Signed)
She continues to complain of left leg pain, again sounds spasm in nature.however patient has been complaining about this for approximately a year, ABIs ordered last year was never completed. Patient has history of coronary artery disease. She is on daily aspirin. Tylenol for pain. Reordered CMP, mag, phosphorus and ABI today. We will call patient with results once they become available. May consider x-ray of the leg, however does not appear to be pain from the knee by exam. DME walker ordered today. Follow-up in 4 weeks

## 2013-11-24 ENCOUNTER — Encounter: Payer: Self-pay | Admitting: Clinical

## 2013-11-24 ENCOUNTER — Telehealth: Payer: Self-pay | Admitting: Family Medicine

## 2013-11-24 LAB — VITAMIN D 25 HYDROXY (VIT D DEFICIENCY, FRACTURES): Vit D, 25-Hydroxy: 37 ng/mL (ref 30–89)

## 2013-11-24 NOTE — Progress Notes (Signed)
Referral for DME - wheel chair has been sent to [facility].  Hunt Oris, MSW, [place]

## 2013-11-24 NOTE — Telephone Encounter (Signed)
Please call Bethany Cardenas, all of her labs so far are normal. I will call her once her ABI is completed and resulted. If there is no cause for her pain from that study i will perform an xray. Thanks.

## 2013-11-24 NOTE — Telephone Encounter (Signed)
Spoke with and informed her of below results

## 2013-11-25 ENCOUNTER — Encounter: Payer: Self-pay | Admitting: Clinical

## 2013-11-25 NOTE — Progress Notes (Signed)
CSW informed by [facility] that pt received a rolling walker in 2014 and therefore does not qualify for a new one. [facility] to call and notify pt.  Hunt Oris, MSW, [place]

## 2014-01-26 ENCOUNTER — Encounter (HOSPITAL_COMMUNITY): Payer: Self-pay | Admitting: Internal Medicine

## 2014-02-08 ENCOUNTER — Other Ambulatory Visit: Payer: Self-pay | Admitting: Family Medicine

## 2014-03-11 ENCOUNTER — Other Ambulatory Visit: Payer: Self-pay | Admitting: Family Medicine

## 2014-03-12 ENCOUNTER — Other Ambulatory Visit: Payer: Self-pay | Admitting: Family Medicine

## 2014-03-16 ENCOUNTER — Other Ambulatory Visit: Payer: Self-pay | Admitting: Family Medicine

## 2014-04-04 DIAGNOSIS — D2371 Other benign neoplasm of skin of right lower limb, including hip: Secondary | ICD-10-CM | POA: Diagnosis not present

## 2014-04-04 DIAGNOSIS — L602 Onychogryphosis: Secondary | ICD-10-CM | POA: Diagnosis not present

## 2014-04-27 ENCOUNTER — Encounter: Payer: Self-pay | Admitting: Family Medicine

## 2014-04-27 ENCOUNTER — Ambulatory Visit (INDEPENDENT_AMBULATORY_CARE_PROVIDER_SITE_OTHER): Payer: Medicare Other | Admitting: Family Medicine

## 2014-04-27 VITALS — BP 133/74 | HR 101 | Temp 97.6°F | Ht 60.0 in | Wt 122.0 lb

## 2014-04-27 DIAGNOSIS — J441 Chronic obstructive pulmonary disease with (acute) exacerbation: Secondary | ICD-10-CM | POA: Insufficient documentation

## 2014-04-27 IMAGING — RF DG ESOPHAGUS
14 of 20 series · 14 of 20 positions shown · non-contrast
Comparison: None.

FLUOROSCOPY TIME:  1 min 5 seconds

CLINICAL DATA: Dysphagia

EXAM:
ESOPHOGRAM/BARIUM SWALLOW
TECHNIQUE: Single contrast examination was performed using  thin barium.

[Series 1: run · 1 of 1 slices shown (1 of 14)]
[im 1/1]
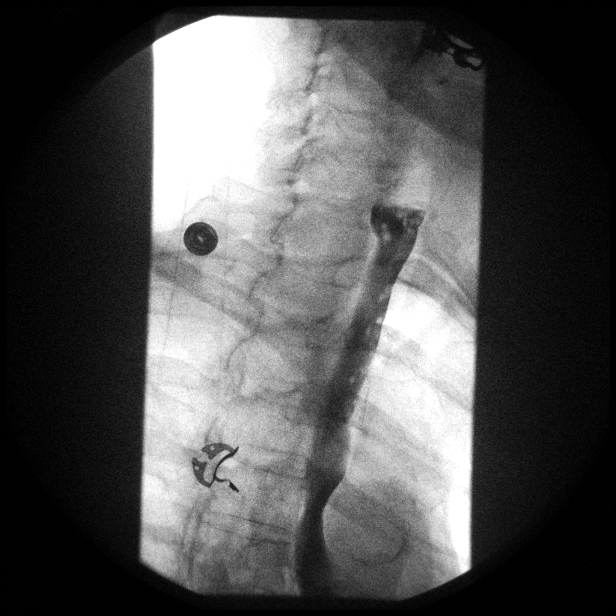

[Series 3: run · 1 of 1 slices shown (2 of 14)]
[im 1/1]
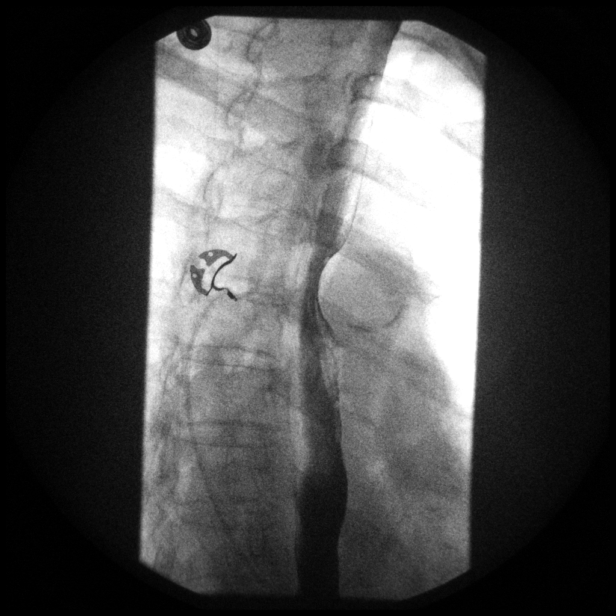

[Series 4: run · 1 of 1 slices shown (3 of 14)]
[im 1/1]
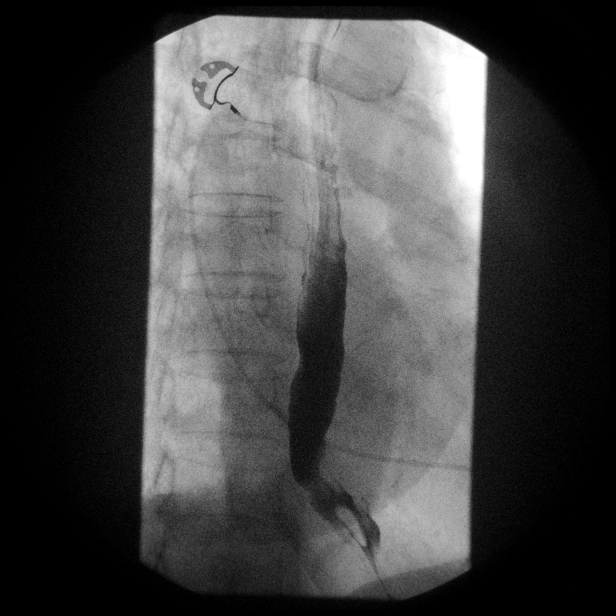

[Series 6: run · 1 of 1 slices shown (4 of 14)]
[im 1/1]
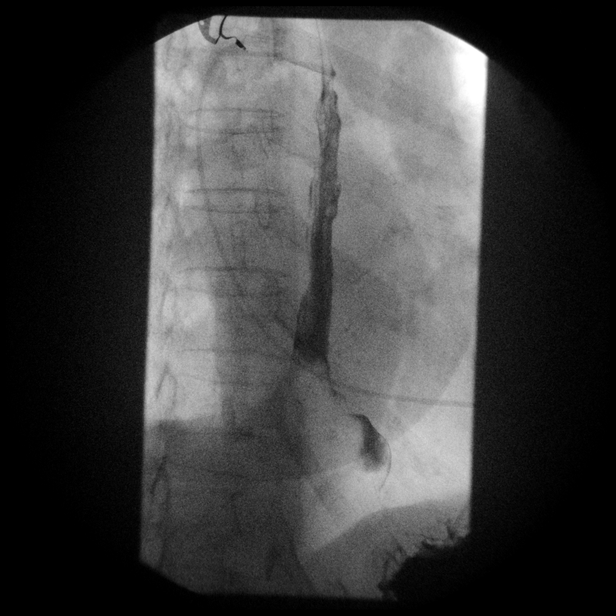

[Series 7: run · 1 of 1 slices shown (5 of 14)]
[im 1/1]
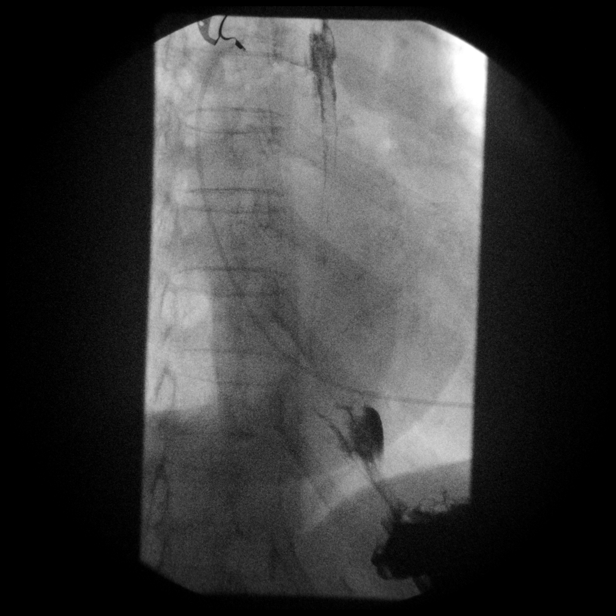

[Series 8: run · 1 of 1 slices shown (6 of 14)]
[im 1/1]
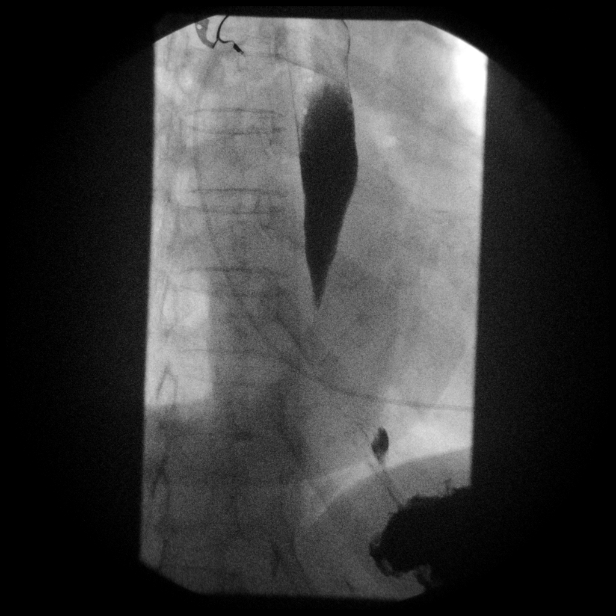

[Series 10: run · 1 of 1 slices shown (7 of 14)]
[im 1/1]
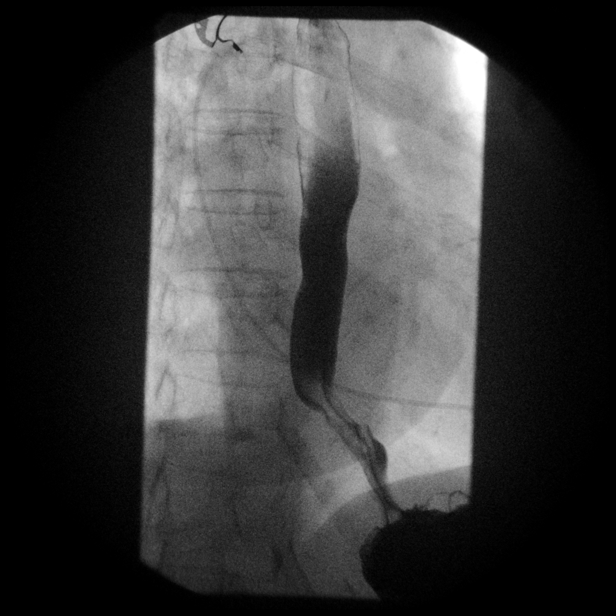

[Series 11: run · 1 of 1 slices shown (8 of 14)]
[im 1/1]
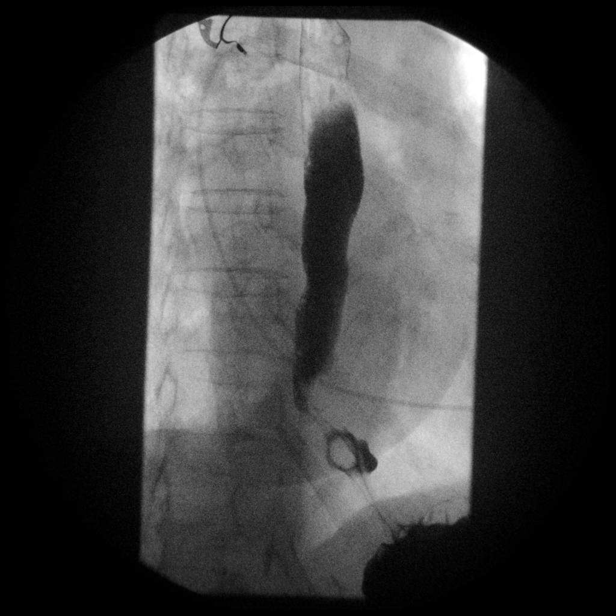

[Series 13: run · 1 of 1 slices shown (9 of 14)]
[im 1/1]
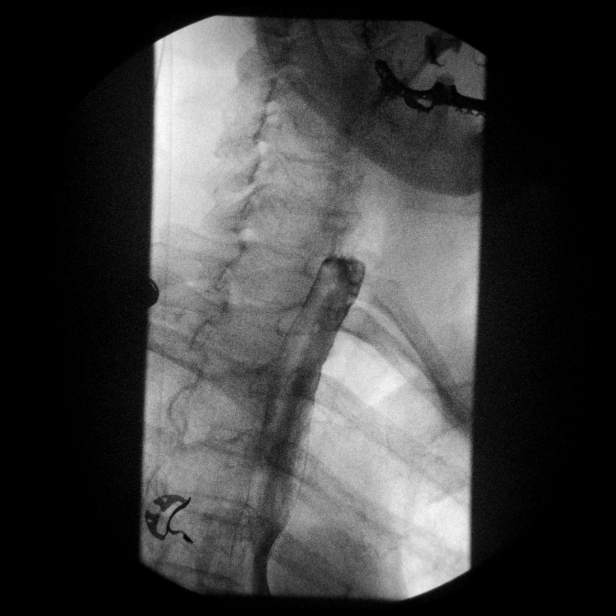

[Series 14: run · 1 of 1 slices shown (10 of 14)]
[im 1/1]
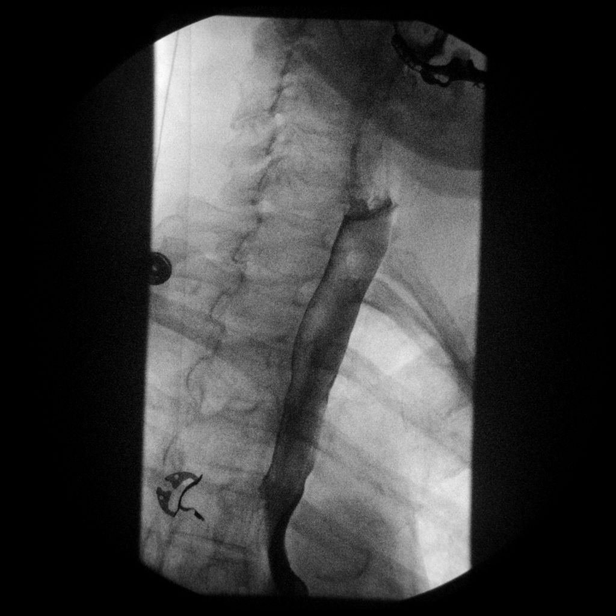

[Series 16: run · 1 of 1 slices shown (11 of 14)]
[im 1/1]
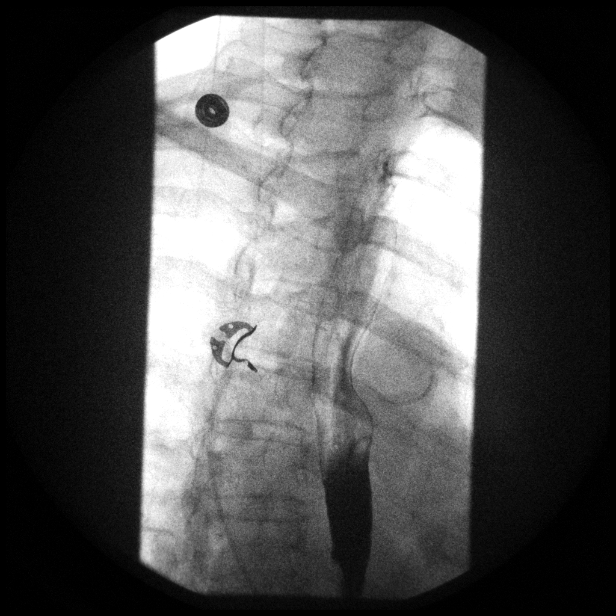

[Series 17: run · 1 of 1 slices shown (12 of 14)]
[im 1/1]
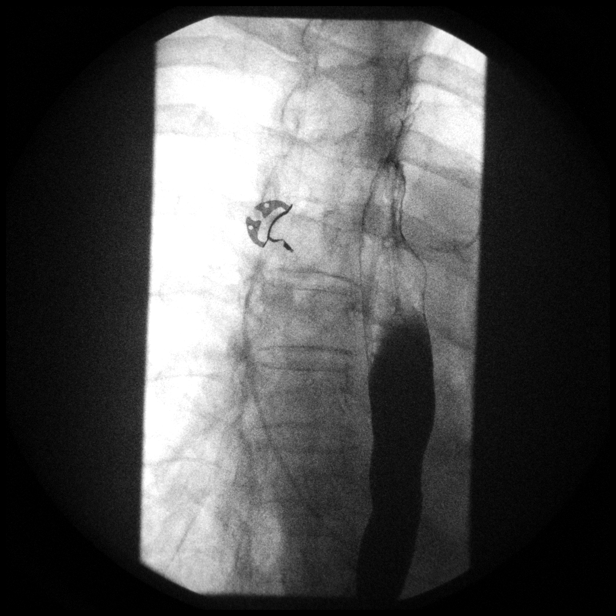

[Series 18: run · 1 of 1 slices shown (13 of 14)]
[im 1/1]
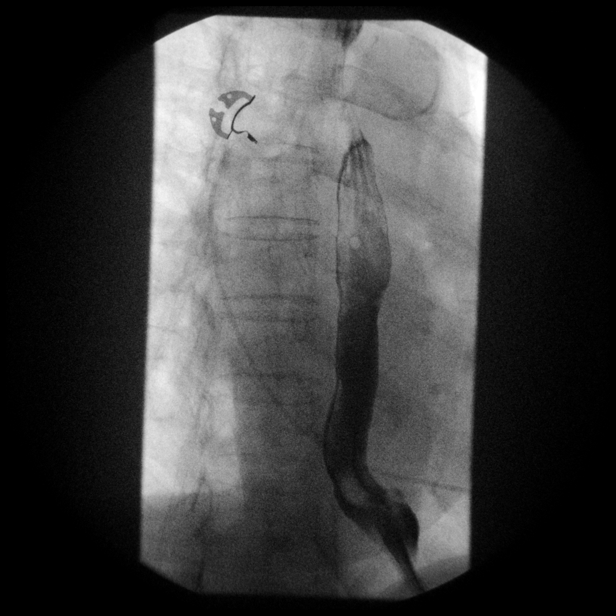

[Series 20: run · 1 of 1 slices shown (14 of 14)]
[im 1/1]
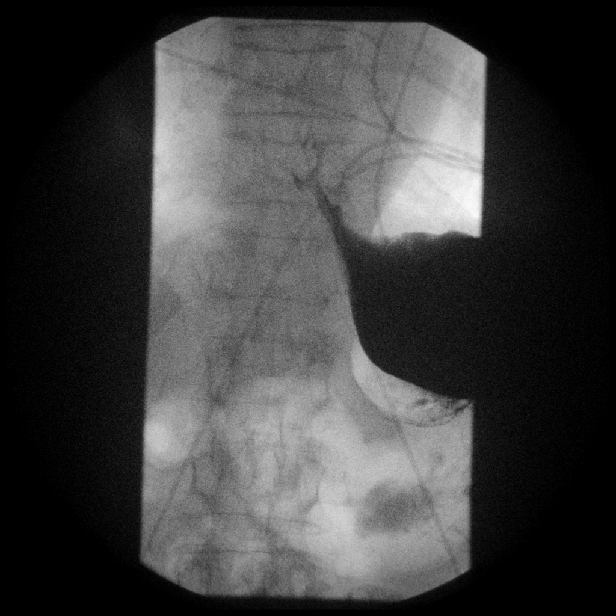

[14 of 20 positions shown; findings below may reference images not displayed]

FINDINGS: Examination is somewhat limited as do entirety of the examination
was performed in the semi-upright oblique position without
effervescent crystals. There was normal pharyngeal anatomy and
motility. Contrast flowed freely through the esophagus without
evidence of stricture or mass. There was grossly normal esophageal
mucosa without evidence of irregularity or ulceration. Esophageal
motility was normal. There is gastroesophageal reflux. There was a
small hiatal hernia.

At the end of the examination a 12.5mm barium tablet was
administered which transited through the esophagus and
esophagogastric junction without delay.
IMPRESSION: 1. No evidence of esophageal obstruction. Normal passage of a
mm barium tablet.

2.  Gastroesophageal reflux.

## 2014-04-27 MED ORDER — ALBUTEROL SULFATE (2.5 MG/3ML) 0.083% IN NEBU: 2.5000 mg | INHALATION_SOLUTION | Freq: Four times a day (QID) | RESPIRATORY_TRACT | Status: DC | PRN

## 2014-04-27 MED ORDER — LEVOFLOXACIN 500 MG PO TABS: 500.0000 mg | ORAL_TABLET | Freq: Every day | ORAL | Status: DC

## 2014-04-27 MED ORDER — PREDNISONE 20 MG PO TABS: 20.0000 mg | ORAL_TABLET | Freq: Every day | ORAL | Status: DC

## 2014-04-27 NOTE — Progress Notes (Signed)
Patient ID: Bethany Cardenas, female   DOB: 1922-02-21, 81 y.o.   MRN: [id number]   HPI  Patient presents today for cough  Patient and her family explained that she's had  Productive cough, dyspnea, and chest pain with cough for the last 5 days. She also states that she's had malaise. She also has bilateral leg pain for the same amount of time.  They report normal by mouth intake.  she does not seem to struggle to breathe  no increased work of breathing She cannot use her inhaler due to discoordination and difficulty squeezing the canister.   she has quiet speech which is stable since her previous stroke.  She states that she has very good diuresis with her normal dose of Lasix. She denies orthopnea.   Smoking status noted ROS: Per HPI  Objective: BP 133/74 mmHg  Pulse 101  Temp(Src) 97.6 F (36.4 C) (Oral)  Ht 5' (1.524 m)  Wt 122 lb (55.339 kg)  BMI 23.83 kg/m2 Gen: NAD, alert, cooperative with exam HEENT: NCAT CV: RRR, good S1/S2, no murmur Resp:  Crackles at the bilateral bases , limited only to the bases, nonlabored,  Poor air movement throughout , no wheezing Ext:  Trace pitting edema bilaterally, calves symmetric in circumference at 30 cm bilaterally Neuro: Alert and oriented, No gross deficits  Assessment and plan:  COPD exacerbation Cough, dyspnea for 5 days Given age will treat more aggresively Levaquin and prednisone Neb prescribed as well, she used her MDI in front od me and it was inadequate technique - given her post stroke state i think the coordination required is probably lacking, may also benefit from an aerochamber Consider CHF, had crackles in the bases BL but very little peripheral edema and no orthopnea, also has good diu8resis with lasix, considered COPD most l;ikely Red flags reviewed    Orders Placed This Encounter  Procedures  . DME Nebulizer machine    Meds ordered this encounter  Medications  . levofloxacin (LEVAQUIN) 500 MG tablet   Sig: Take 1 tablet (500 mg total) by mouth daily.    Dispense:  7 tablet    Refill:  0  . predniSONE (DELTASONE) 20 MG tablet    Sig: Take 1 tablet (20 mg total) by mouth daily with breakfast.    Dispense:  5 tablet    Refill:  0  . albuterol (PROVENTIL) (2.5 MG/3ML) 0.083% nebulizer solution    Sig: Take 3 mLs (2.5 mg total) by nebulization every 6 (six) hours as needed for wheezing or shortness of breath.    Dispense:  150 mL    Refill:  1

## 2014-04-27 NOTE — Assessment & Plan Note (Addendum)
Cough, dyspnea for 5 days Given age will treat more aggresively Levaquin and prednisone Neb prescribed as well, she used her MDI in front od me and it was inadequate technique - given her post stroke state i think the coordination required is probably lacking, may also benefit from an aerochamber Consider CHF, had crackles in the bases BL but very little peripheral edema and no orthopnea, also has good diu8resis with lasix, considered COPD most l;ikely Red flags reviewed

## 2014-04-27 NOTE — Patient Instructions (Signed)
Great to meet you guys!  Get the nebulizer at the pharmacy  Chronic Obstructive Pulmonary Disease Chronic obstructive pulmonary disease (COPD) is a common lung condition in which airflow from the lungs is limited. COPD is a general term that can be used to describe many different lung problems that limit airflow, including both chronic bronchitis and emphysema. If you have COPD, your lung function will probably never return to normal, but there are measures you can take to improve lung function and make yourself feel better.  CAUSES   Smoking (common).   Exposure to secondhand smoke.   Genetic problems.  Chronic inflammatory lung diseases or recurrent infections. SYMPTOMS   Shortness of breath, especially with physical activity.   Deep, persistent (chronic) cough with a large amount of thick mucus.   Wheezing.   Rapid breaths (tachypnea).   Gray or bluish discoloration (cyanosis) of the skin, especially in fingers, toes, or lips.   Fatigue.   Weight loss.   Frequent infections or episodes when breathing symptoms become much worse (exacerbations).   Chest tightness. DIAGNOSIS  Your health care provider will take a medical history and perform a physical examination to make the initial diagnosis. Additional tests for COPD may include:   Lung (pulmonary) function tests.  Chest X-ray.  CT scan.  Blood tests. TREATMENT  Treatment available to help you feel better when you have COPD includes:   Inhaler and nebulizer medicines. These help manage the symptoms of COPD and make your breathing more comfortable.  Supplemental oxygen. Supplemental oxygen is only helpful if you have a low oxygen level in your blood.   Exercise and physical activity. These are beneficial for nearly all people with COPD. Some people may also benefit from a pulmonary rehabilitation program. HOME CARE INSTRUCTIONS   Take all medicines (inhaled or pills) as directed by your health care  provider.  Avoid over-the-counter medicines or cough syrups that dry up your airway (such as antihistamines) and slow down the elimination of secretions unless instructed otherwise by your health care provider.   If you are a smoker, the most important thing that you can do is stop smoking. Continuing to smoke will cause further lung damage and breathing trouble. Ask your health care provider for help with quitting smoking. He or she can direct you to community resources or hospitals that provide support.  Avoid exposure to irritants such as smoke, chemicals, and fumes that aggravate your breathing.  Use oxygen therapy and pulmonary rehabilitation if directed by your health care provider. If you require home oxygen therapy, ask your health care provider whether you should purchase a pulse oximeter to measure your oxygen level at home.   Avoid contact with individuals who have a contagious illness.  Avoid extreme temperature and humidity changes.  Eat healthy foods. Eating smaller, more frequent meals and resting before meals may help you maintain your strength.  Stay active, but balance activity with periods of rest. Exercise and physical activity will help you maintain your ability to do things you want to do.  Preventing infection and hospitalization is very important when you have COPD. Make sure to receive all the vaccines your health care provider recommends, especially the pneumococcal and influenza vaccines. Ask your health care provider whether you need a pneumonia vaccine.  Learn and use relaxation techniques to manage stress.  Learn and use controlled breathing techniques as directed by your health care provider. Controlled breathing techniques include:   Pursed lip breathing. Start by breathing in (inhaling) through  your nose for 1 second. Then, purse your lips as if you were going to whistle and breathe out (exhale) through the pursed lips for 2 seconds.   Diaphragmatic  breathing. Start by putting one hand on your abdomen just above your waist. Inhale slowly through your nose. The hand on your abdomen should move out. Then purse your lips and exhale slowly. You should be able to feel the hand on your abdomen moving in as you exhale.   Learn and use controlled coughing to clear mucus from your lungs. Controlled coughing is a series of short, progressive coughs. The steps of controlled coughing are:  1. Lean your head slightly forward.  2. Breathe in deeply using diaphragmatic breathing.  3. Try to hold your breath for 3 seconds.  4. Keep your mouth slightly open while coughing twice.  5. Spit any mucus out into a tissue.  6. Rest and repeat the steps once or twice as needed. SEEK MEDICAL CARE IF:   You are coughing up more mucus than usual.   There is a change in the color or thickness of your mucus.   Your breathing is more labored than usual.   Your breathing is faster than usual.  SEEK IMMEDIATE MEDICAL CARE IF:   You have shortness of breath while you are resting.   You have shortness of breath that prevents you from:  Being able to talk.   Performing your usual physical activities.   You have chest pain lasting longer than 5 minutes.   Your skin color is more cyanotic than usual.  You measure low oxygen saturations for longer than 5 minutes with a pulse oximeter. MAKE SURE YOU:   Understand these instructions.  Will watch your condition.  Will get help right away if you are not doing well or get worse. Document Released: 10/09/2004 Document Revised: 05/16/2013 Document Reviewed: 08/26/2012 [facility] Patient Information 2015 [place], [place]. This information is not intended to replace advice given to you by your health care provider. Make sure you discuss any questions you have with your health care provider.

## 2014-04-28 ENCOUNTER — Telehealth: Payer: Self-pay | Admitting: Family Medicine

## 2014-04-28 NOTE — Telephone Encounter (Signed)
Bethany Cardenas did call back and informed us that she did have a Rx for the machine and we informed her that she could get it filled at [facility] on [address]. Provided the address and phone number to her for this place. [name], April D

## 2014-04-28 NOTE — Telephone Encounter (Signed)
LVM for [facility] (emergency contact) to call back to see if the pt was given a Rx for the machine.  If not then we can send this to the dr for her to advise. [name], April D

## 2014-04-28 NOTE — Telephone Encounter (Signed)
Pt was given the solution that goes in a nebulizer machine, went to the pharmacy and they do not have nebulizer machines, would like to know where they can get this?

## 2014-06-23 ENCOUNTER — Other Ambulatory Visit: Payer: Self-pay | Admitting: Family Medicine

## 2014-07-21 ENCOUNTER — Encounter: Payer: Self-pay | Admitting: Cardiovascular Disease

## 2014-07-21 ENCOUNTER — Ambulatory Visit (INDEPENDENT_AMBULATORY_CARE_PROVIDER_SITE_OTHER): Payer: Medicare Other | Admitting: Cardiovascular Disease

## 2014-07-21 VITALS — BP 136/80 | HR 95 | Ht 59.0 in | Wt 114.0 lb

## 2014-07-21 DIAGNOSIS — I2510 Atherosclerotic heart disease of native coronary artery without angina pectoris: Secondary | ICD-10-CM

## 2014-07-21 DIAGNOSIS — I481 Persistent atrial fibrillation: Secondary | ICD-10-CM | POA: Diagnosis not present

## 2014-07-21 DIAGNOSIS — I70219 Atherosclerosis of native arteries of extremities with intermittent claudication, unspecified extremity: Secondary | ICD-10-CM | POA: Diagnosis not present

## 2014-07-21 DIAGNOSIS — I10 Essential (primary) hypertension: Secondary | ICD-10-CM | POA: Diagnosis not present

## 2014-07-21 DIAGNOSIS — I4819 Other persistent atrial fibrillation: Secondary | ICD-10-CM

## 2014-07-21 DIAGNOSIS — I5032 Chronic diastolic (congestive) heart failure: Secondary | ICD-10-CM

## 2014-07-21 NOTE — Progress Notes (Signed)
Patient ID: Bethany Cardenas, female   DOB: 11-Jul-1922, 81 y.o.   MRN: [id number]     Cardiology Office Note   Date:  07/22/2014   ID:  Bethany Cardenas, DOB 1922/07/30, MRN [id number]  PCP:  Howard Pouch, DO  Cardiologist:   Sanda Klein, MD   Chief Complaint  Patient presents with  . Annual Exam    Patient has feels dizzy at times and sob.      History of Present Illness: Bethany Cardenas is a 81 y.o. female who presents for atrial fibrillation, CAD, PAD, diastolic CHF, hypertrophic cardiomyopathy, hyperlipidemia, HTN  She is now 81 years old.  She remains very frail and does not move around much. Even walking through the house can make her dyspneic if she tries to rush. However if she walks at a slow pace she has no complaints. She is very unsteady on her feet and often requires help.  She has long-standing and severe vascular disease involving the carotid arteries, coronary arteries and the lower extremities. She is quite elderly but continues to live in her own home with her sister, Bethany Cardenas, who is also our patient. She has at most mild and infrequent problems with exertional dyspnea, but no other complaints. She denies angina pectoris, intermittent claudication or any recent focal neurological deficits.  She has had occasional falls and remains very unsteady on her feet. She has extremely limited mobility.  Cardiac catheterization in 2008 showed an 80% stenosis of the left main coronary artery, total occlusion of the LAD artery following the first septal and first diagonal branches with reconstitution of the distal LAD via collaterals, total occlusion of the left circumflex coronary artery from the first oblique marginal artery, and total occlusion of the right coronary artery following the RV marginal branch, again with reconstitution of the distal vessel via collaterals. She was evaluated by cardiovascular surgery at that time and felt to be a poor candidate for bypass. She has done  remarkably well on medical therapy and despite the extent of her coronary obstructive disease, she is free of angina or dyspnea.  Her last carotid ultrasound study performed in 2013 showed a 50-69% obstruction in the left proximal internal carotid artery and a less than 50% stenosis on the right side. In January 2015 she was admitted with transient acute dysphagia and dysphonia, but imaging studies did not show evidence of recent stroke. MRA showed a remote right basal ganglia infarct with occlusion of the M1 segment of the right middle cerebral artery.   In 2007, lower extremity arterial duplex ultrasound showed severely reduced arterial flow to both feet. The right ABI was 0.42, left ABI was 0.39. The right proximal SFA was occluded with distal reconstitution. The right peroneal artery was occluded. Right posterior tibial artery was also occluded but reconstituted in the mid to distal segment. On the left side the arterial system was occluded downstream of the left mid SFA  She is in permanent atrial fibrillation for an unknown duration of time.   She has systemic hypertension and evidence of left ventricular hypertrophy. The angiographic appearance during left ventriculography suggested apical variant hypertrophic cardiomyopathy, but they also may be a component of hypertensive heart disease. By echocardiography and ventriculography she had preserved left ventricular systolic function in the past, with EF of 55-60% and concentric LVH by echo in January 2015. She bears a diagnosis of congestive heart failure but has not required hospitalization or any adjustment in her diuretic therapy in a very long  time.  Past Medical History  Diagnosis Date  . TOBACCO DEPENDENCE 03/12/2006    Qualifier: Diagnosis of  By: Eusebio Friendly    . CVA 03/12/2006    Qualifier: Diagnosis of  By: Eusebio Friendly    . Stroke   . Atrial fibrillation   . Atherosclerotic cardiovascular disease   . Hypertrophic  cardiomyopathy   . PVD (peripheral vascular disease)   . Carotid disease, bilateral   . Dyslipidemia   . Systemic hypertension     Past Surgical History  Procedure Laterality Date  . Vein surgery  1999 & 2000  . Cardiac catheterization  01/27/2006    total occlusion of the mid RCA,80% stenosis of the distal left main, total occlusion of the mid LAD following the first septal & first diagonal arteries,total occlusion of the left CX followint the first oblique marginal artery, reconstitution of the distal LAD and distal RCA via collaterals  . Nm myocar perf wall motion  11/25/2005    intermediate risk - mild inferolateral ischemia towards the apex extending to mid LV.     Current Outpatient Prescriptions  Medication Sig Dispense Refill  . albuterol (PROVENTIL) (2.5 MG/3ML) 0.083% nebulizer solution Take 3 mLs (2.5 mg total) by nebulization every 6 (six) hours as needed for wheezing or shortness of breath. 150 mL 1  . albuterol-ipratropium (COMBIVENT) 18-103 MCG/ACT inhaler Inhale 1 puff into the lungs every 6 (six) hours as needed for wheezing or shortness of breath. 1 Inhaler 7  . aspirin (ADULT ASPIRIN EC LOW STRENGTH) 81 MG EC tablet Take 81 mg by mouth daily.     . cetirizine (ZYRTEC) 5 MG tablet TAKE 1 TABLET (5 MG TOTAL) BY MOUTH AT BEDTIME. 30 tablet 10  . ergocalciferol (VITAMIN D2) 50000 UNITS capsule Take 50,000 Units by mouth once a week. Unknown day of the week    . fluticasone (FLONASE) 50 MCG/ACT nasal spray Place 2 sprays into both nostrils daily. 16 g 6  . furosemide (LASIX) 40 MG tablet TAKE 1 TABLET BY MOUTH EVERY DAY 30 tablet 2  . levofloxacin (LEVAQUIN) 500 MG tablet Take 1 tablet (500 mg total) by mouth daily. 7 tablet 0  . metoprolol (LOPRESSOR) 50 MG tablet TAKE 1 TABLET BY MOUTH TWICE A DAY 60 tablet 10  . pantoprazole (PROTONIX) 20 MG tablet TAKE 2 TABLETS BY MOUTH EVERY DAY 60 tablet 3  . simvastatin (ZOCOR) 40 MG tablet TAKE ONE-HALF TABLET BY MOUTH EVERY NIGHT  AT BEDTIME 31 tablet 6   No current facility-administered medications for this visit.    Allergies:   Review of patient's allergies indicates no known allergies.    Social History:  The patient  reports that she has quit smoking. She has never used smokeless tobacco. She reports that she does not drink alcohol or use illicit drugs.   Family History:  The patient's family history includes Cancer in her sister; Diabetes in her father; Hypertension in her sister.    ROS:  Please see the history of present illness.    Otherwise, review of systems positive for none.   All other systems are reviewed and negative.    PHYSICAL EXAM: VS:  BP 136/80 mmHg  Pulse 95  Ht 4\' 11"  (1.499 m)  Wt 114 lb (51.71 kg)  BMI 23.01 kg/m2 , BMI Body mass index is 23.01 kg/(m^2).  General: Alert, oriented x3, no distress Head: no evidence of trauma, PERRL, EOMI, no exophtalmos or lid lag, no myxedema, no xanthelasma; normal ears, nose  and oropharynx Neck: normal jugular venous pulsations and no hepatojugular reflux; brisk carotid pulses without delay , but with bilateral carotid bruits Chest: clear to auscultation, no signs of consolidation by percussion or palpation, normal fremitus, symmetrical and full respiratory excursions Cardiovascular: normal position and quality of the apical impulse, regular rhythm, normal first and second heart sounds, no murmurs, rubs or gallops Abdomen: no tenderness or distention, no masses by palpation, no abnormal pulsatility or arterial bruits, normal bowel sounds, no hepatosplenomegaly Extremities: no clubbing, cyanosis or edema; 2+ radial, ulnar and brachial pulses bilaterally; 2+ right femoral and 2+ left femoral, no palpable popliteal, posterior tibial and dorsalis pedis pulses; bilateral femoral bruits Neurological: grossly nonfocal Psych: euthymic mood, full affect   EKG:  EKG is ordered today. The ekg ordered today demonstrates  Atrial fibrillation with controlled  ventricular rate, mild T-wave inversion in several leads, QTC 439 ms, no recent changes   Recent Labs: 11/23/2013: ALT <8; BUN 16; Creat 0.91; Magnesium 1.7; Potassium 4.1; Sodium 143    Lipid Panel    Component Value Date/Time   CHOL 148 01/11/2013 0720   TRIG 43 01/11/2013 0720   HDL 74 01/11/2013 0720   CHOLHDL 2.0 01/11/2013 0720   VLDL 9 01/11/2013 0720   LDLCALC 65 01/11/2013 0720   LDLDIRECT 117* 08/12/2006 2120      Wt Readings from Last 3 Encounters:  07/21/14 114 lb (51.71 kg)  04/27/14 122 lb (55.339 kg)  11/23/13 120 lb 4.8 oz (54.568 kg)     ASSESSMENT AND PLAN:  Atrial fibrillation, persistent Mrs. Pollina clearly has severely elevated risk of embolic stroke. Her CHADSVasc score is at least 7. However she is also at high risk of bleeding and falls. From a statistical point of view, treatment with warfarin or equivalent anticoagulant would lead to a neck benefit of 0 (number needed to treat to prevent stroke 1/11, number needed to harm by serious bleeding 1/11). Aspirin seems to provide a better balance of risk and benefit, although clearly will not be as effective at preventing stroke. Her rate is well controlled on metoprolol. The arrhythmia is asymptomatic and there is no indication for cardioversion or true antiarrhythmics.  CAD (coronary artery disease), native coronary artery Mrs. Amerman has widespread and severe CAD with high-grade stenosis of the left coronary artery and total occlusion of the mid LAD, mid circumflex and mid RCA. Despite this shows preserved left ventricular systolic function. She has done well on medical therapy, and her survival has already gone well beyond that expected for the severity of her disease. No plan for revascularization. Continue aspirin, beta blocker and statin. She is free of angina.  HYPERTENSION, BENIGN SYSTEMIC Current BP control is appropriate for her advanced age and extensive cerebrovascular  disease.  HYPERCHOLESTEROLEMIA Excellent lipid profile on current statin.  Chronic diastolic heart failure NYHA class3. Clinically there are no signs of hypervolemia. Continue the low dose of diuretic. I do not see much benefit in adding an ACE inhibitor at her advanced age since she has preserved left ventricular systolic function.  Atherosclerosis of native arteries of the extremities with intermittent claudication Right now, she is asymptomatic, again probably due to the fact that her level of physical activity is decreased.  Carotid stenosis It is unlikely that she'll be considered for carotid endarterectomy. Serial ultrasonography is not justified. Continue medical treatment.    Current medicines are reviewed at length with the patient today.  The patient does not have concerns regarding medicines.  The following  changes have been made:  no change  Labs/ tests ordered today include:  Orders Placed This Encounter  Procedures  . EKG 12-Lead   Patient Instructions  Dr. Sallyanne Kuster recommends that you schedule a follow-up appointment in: ONE YEAR       SignedSanda Klein, MD  07/22/2014 10:39 AM    Sanda Klein, MD, [facility] [phone] office [phone] pager

## 2014-07-21 NOTE — Patient Instructions (Signed)
Dr. Croitoru recommends that you schedule a follow-up appointment in: ONE YEAR   

## 2014-07-31 ENCOUNTER — Other Ambulatory Visit: Payer: Self-pay | Admitting: *Deleted

## 2014-08-01 MED ORDER — PANTOPRAZOLE SODIUM 20 MG PO TBEC: 40.0000 mg | DELAYED_RELEASE_TABLET | Freq: Every day | ORAL | Status: DC

## 2014-09-07 ENCOUNTER — Other Ambulatory Visit: Payer: Self-pay | Admitting: *Deleted

## 2014-09-07 MED ORDER — METOPROLOL TARTRATE 50 MG PO TABS: 50.0000 mg | ORAL_TABLET | Freq: Two times a day (BID) | ORAL | Status: DC

## 2014-09-21 ENCOUNTER — Ambulatory Visit (INDEPENDENT_AMBULATORY_CARE_PROVIDER_SITE_OTHER): Payer: Medicare Other | Admitting: Family Medicine

## 2014-09-21 VITALS — BP 189/91 | HR 100 | Temp 97.9°F | Ht 59.0 in | Wt 117.0 lb

## 2014-09-21 DIAGNOSIS — J449 Chronic obstructive pulmonary disease, unspecified: Secondary | ICD-10-CM

## 2014-09-21 DIAGNOSIS — I70219 Atherosclerosis of native arteries of extremities with intermittent claudication, unspecified extremity: Secondary | ICD-10-CM

## 2014-09-21 MED ORDER — TIOTROPIUM BROMIDE MONOHYDRATE 18 MCG IN CAPS: 18.0000 ug | ORAL_CAPSULE | Freq: Every day | RESPIRATORY_TRACT | Status: DC

## 2014-09-21 NOTE — Patient Instructions (Signed)
Thank you for coming to see me today. It was a pleasure. Today we talked about:   COPD: I am starting another inhaler today. You can use this every day to hopefully help manage your breathing better.   Leg pain: since this is very temporary, we will watch for now. To help with swelling, keep your legs elevated when sitting or lying down (above heart level)  Please make an appointment to see for physical/ yearly checkup  If you have any questions or concerns, please do not hesitate to call the office at [phone].  Sincerely,  Cordelia Poche, MD

## 2014-09-21 NOTE — Progress Notes (Signed)
    Subjective    Bethany Cardenas is a 81 y.o. female that presents for a follow-up visit for chronic issues.   1. COPD: Symptoms have not been worsening. She reports no coughing. No fevers. She uses her combivent 2-3 times per week in addition to albuterol through her nebulizer about 2-3 times per week with mostly night time symptoms.  She reports sleeping on two pillows and does not endorse PND.   2. Left ankle pain: Symptoms started . Symptoms worse in the morning but improve with rubbing.   Social History  Substance Use Topics  . Smoking status: Former Research scientist (life sciences)  . Smokeless tobacco: Never Used  . Alcohol Use: No    No Known Allergies  No orders of the defined types were placed in this encounter.    ROS  Per HPI   Objective   BP 189/91 mmHg  Pulse 100  Temp(Src) 97.9 F (36.6 C) (Oral)  Ht 4\' 11"  (1.499 m)  Wt 117 lb (53.071 kg)  BMI 23.62 kg/m2  General: Well appearing, no distress Respiratory/Chest: Crackles at bases, diminished throughout without focal lesion    Extremities: Bilateral legs with trace edema and no tenderness Neuro: Alert, oriented  Assessment and Plan   Please refer to problem based charting of assessment and plan

## 2014-09-21 NOTE — Assessment & Plan Note (Signed)
Will start Spiriva since she is using her bronchodilators frequently. Has had no previous PFTs in the past. Symptoms possibly related to CHF, but with improvement of symptoms after use of inhalers, will gear more towards COPD - start Spiriva - Spacer given in office - Schedule PFTs with Dr. Valentina Lucks

## 2014-09-25 ENCOUNTER — Ambulatory Visit: Payer: Medicare Other | Admitting: Family Medicine

## 2014-09-28 ENCOUNTER — Ambulatory Visit (INDEPENDENT_AMBULATORY_CARE_PROVIDER_SITE_OTHER): Payer: Medicare Other | Admitting: Pharmacist

## 2014-09-28 ENCOUNTER — Encounter: Payer: Self-pay | Admitting: Pharmacist

## 2014-09-28 VITALS — BP 169/85 | HR 101 | Ht 61.0 in | Wt 118.3 lb

## 2014-09-28 DIAGNOSIS — E559 Vitamin D deficiency, unspecified: Secondary | ICD-10-CM

## 2014-09-28 DIAGNOSIS — J449 Chronic obstructive pulmonary disease, unspecified: Secondary | ICD-10-CM

## 2014-09-28 DIAGNOSIS — J441 Chronic obstructive pulmonary disease with (acute) exacerbation: Secondary | ICD-10-CM

## 2014-09-28 MED ORDER — IPRATROPIUM-ALBUTEROL 0.5-2.5 (3) MG/3ML IN SOLN
3.0000 mL | Freq: Four times a day (QID) | RESPIRATORY_TRACT | Status: DC | PRN
Start: 2014-09-28 — End: 2015-07-21

## 2014-09-28 NOTE — Assessment & Plan Note (Signed)
Spirometry evaluation reveals Severe restrictive lung disease. Post nebulized albuterol tx revealed no change in spirometry results, but patient stated she felt better after the treatment.    Patient has been experiencing shortness of breath for a long period of time and taking ipratropium-albuterol (Combivent) 18-103 1 puff into the lungs every 6 hours and albuterol 0.083% neb every 6 hrs as needed. Begin duoneb (albuterol, ipratropium) every 6 hours as needed. Continue Combivent prn if you do not have access to the duoneb. Discontinue Spiriva as patient was unable to use inhaler. Educated patient on purpose, proper use, potential adverse effects. Reviewed results of pulmonary function tests.  Pt verbalized understanding of results and education.  Written pt instructions provided.  F/U Clinic visit with Dr. Lonny Prude October 21st.   Total time in face to face counseling 30 minutes.  Patient seen with Roxy Manns, PharmD Candidate and Elisabeth Most, PharmD, resident.

## 2014-09-28 NOTE — Progress Notes (Addendum)
S:    Patient arrives in good spirits with her sister and her niece.   Presents for lung function evaluation.  Patient has had difficulties using her Spiriva inhaler on her own due to coordination and deep inhalations, so she has not been using it.  She also has some difficulty with the Combivent inhaler PRN. [name] is able to come by the house on a regular basis to set up nebulizer treatments, but usually only one time in the afternoon after she gets off work. The patient lives with her sister.   Patient was a smoker for 50 years (1/2 pack per day), but quit smoking when she moved in with her sister in 69.    O: See "scanned report" or Documentation Flowsheet (discrete results - PFTs) for  Spirometry results. Patient provided suboptimal effort while attempting spirometry despite extensive coaching.    Albuterol Neb 0.083%  Lot# [id number]     Exp. 4/18 Ipratropium bromide Neb 0.02%  Lot# [id number]    Exp. 9/17  A/P: Spirometry evaluation reveals Severe restrictive lung disease. Post nebulized albuterol tx revealed no change in spirometry results, but patient stated she felt better after the treatment.    Patient has been experiencing shortness of breath for a long period of time and taking ipratropium-albuterol (Combivent) 18-103 1 puff into the lungs every 6 hours and albuterol 0.083% neb every 6 hrs as needed. Begin duoneb (albuterol, ipratropium) every 6 hours as needed. Continue Combivent prn if you do not have access to the duoneb. Discontinue Spiriva as patient was unable to use inhaler. Educated patient on purpose, proper use, potential adverse effects. Reviewed results of pulmonary function tests.  Pt verbalized understanding of results and education.  Written pt instructions provided.  F/U Clinic visit with Dr. Lonny Prude October 21st.   Total time in face to face counseling 30 minutes.  Patient seen with Roxy Manns, PharmD Candidate and Elisabeth Most, PharmD, resident.    Vitamin D deficiency: Patient's last Vit D 25-hydroxy was 37 on 11/2013. Consider reassessing Vit D at next lab draw and potentially discontinuing vitamin D2 50,000 units weekly and initiating daily multivitamin with calcium/vitamin D.

## 2014-09-28 NOTE — Patient Instructions (Addendum)
Take DuoNeb every 6 hours as needed for shortness of breath.    Follow up with Dr. Teryl Lucy on 11/03/14 at 9:00 AM.

## 2014-09-28 NOTE — Progress Notes (Signed)
Patient ID: Bethany Cardenas, female   DOB: January 13, 1923, 81 y.o.   MRN: [id number] Reviewed: Agree with Dr. Graylin Shiver documentation and management.

## 2014-09-28 NOTE — Addendum Note (Signed)
Addended by: Leavy Cella on: 09/28/2014 12:05 PM   Modules accepted: Orders

## 2014-10-02 ENCOUNTER — Other Ambulatory Visit: Payer: Self-pay | Admitting: *Deleted

## 2014-10-02 MED ORDER — FUROSEMIDE 40 MG PO TABS: 40.0000 mg | ORAL_TABLET | Freq: Every day | ORAL | Status: DC

## 2014-10-06 NOTE — Assessment & Plan Note (Signed)
Vitamin D deficiency: Patient's last Vit D 25-hydroxy was 37 on 11/2013. Consider reassessing Vit D at next lab draw and potentially discontinuing vitamin D2 50,000 units weekly and initiating daily multivitamin with calcium/vitamin D.

## 2014-11-03 ENCOUNTER — Encounter: Payer: Medicare Other | Admitting: Family Medicine

## 2014-11-13 ENCOUNTER — Encounter: Payer: Self-pay | Admitting: Family Medicine

## 2014-11-13 ENCOUNTER — Ambulatory Visit (INDEPENDENT_AMBULATORY_CARE_PROVIDER_SITE_OTHER): Payer: Medicare Other | Admitting: Family Medicine

## 2014-11-13 VITALS — BP 150/84 | HR 84 | Temp 97.8°F | Ht 61.0 in | Wt 117.0 lb

## 2014-11-13 DIAGNOSIS — I10 Essential (primary) hypertension: Secondary | ICD-10-CM | POA: Diagnosis not present

## 2014-11-13 DIAGNOSIS — Z0000 Encounter for general adult medical examination without abnormal findings: Secondary | ICD-10-CM | POA: Diagnosis not present

## 2014-11-13 LAB — CBC WITH DIFFERENTIAL/PLATELET
Basophils Absolute: 0 10*3/uL (ref 0.0–0.1)
Basophils Relative: 1 % (ref 0–1)
EOS ABS: 0.3 10*3/uL (ref 0.0–0.7)
Eosinophils Relative: 8 % — ABNORMAL HIGH (ref 0–5)
HCT: 38.4 % (ref 36.0–46.0)
HEMOGLOBIN: 12.6 g/dL (ref 12.0–15.0)
LYMPHS ABS: 1.6 10*3/uL (ref 0.7–4.0)
Lymphocytes Relative: 37 % (ref 12–46)
MCH: 30.1 pg (ref 26.0–34.0)
MCHC: 32.8 g/dL (ref 30.0–36.0)
MCV: 91.6 fL (ref 78.0–100.0)
MONOS PCT: 11 % (ref 3–12)
MPV: 12 fL (ref 8.6–12.4)
Monocytes Absolute: 0.5 10*3/uL (ref 0.1–1.0)
NEUTROS PCT: 43 % (ref 43–77)
Neutro Abs: 1.8 10*3/uL (ref 1.7–7.7)
Platelets: 170 10*3/uL (ref 150–400)
RBC: 4.19 MIL/uL (ref 3.87–5.11)
RDW: 13.7 % (ref 11.5–15.5)
WBC: 4.2 10*3/uL (ref 4.0–10.5)

## 2014-11-13 LAB — COMPLETE METABOLIC PANEL WITH GFR
ALBUMIN: 4.4 g/dL (ref 3.6–5.1)
ALK PHOS: 60 U/L (ref 33–130)
ALT: 8 U/L (ref 6–29)
AST: 19 U/L (ref 10–35)
BUN: 18 mg/dL (ref 7–25)
CALCIUM: 9.1 mg/dL (ref 8.6–10.4)
CHLORIDE: 101 mmol/L (ref 98–110)
CO2: 29 mmol/L (ref 20–31)
CREATININE: 0.93 mg/dL — AB (ref 0.60–0.88)
GFR, Est African American: 63 mL/min (ref >=60)
GFR, Est Non African American: 54 mL/min — ABNORMAL LOW (ref >=60)
Glucose, Bld: 76 mg/dL (ref 65–99)
Potassium: 3.6 mmol/L (ref 3.5–5.3)
Sodium: 138 mmol/L (ref 135–146)
Total Bilirubin: 0.8 mg/dL (ref 0.2–1.2)
Total Protein: 7.6 g/dL (ref 6.1–8.1)

## 2014-11-13 LAB — LIPID PANEL
Cholesterol: 147 mg/dL (ref 125–200)
HDL: 66 mg/dL (ref >=46)
LDL Cholesterol: 69 mg/dL (ref <130)
Total CHOL/HDL Ratio: 2.2 Ratio (ref <=5.0)
Triglycerides: 58 mg/dL (ref <150)
VLDL: 12 mg/dL (ref <30)

## 2014-11-13 LAB — TSH: TSH: 0.699 u[IU]/mL (ref 0.350–4.500)

## 2014-11-13 NOTE — Progress Notes (Signed)
Subjective    Bethany Cardenas is a 81 y.o. female that presents for yearly physical exam.   Concerns:  1. Epigastric pain: Pain is intermittent and drinking water helps her symptoms. She reports nothing makes pain worse. She reports normal stools. She has no nausea or vomiting. No fevers.   2. Left ankle pain: Symptoms started months ago. She reports no trauma. Pain is intermittent and especially noticed in the morning. Pain improves the more she walks. She has not taken any medication for her symptoms.  Goals    None      No obstetric history on file. Wt Readings from Last 3 Encounters:  11/13/14 117 lb (53.071 kg)  09/28/14 118 lb 4.8 oz (53.661 kg)  09/21/14 117 lb (53.071 kg)   Last period:   Regular periods: No Heavy bleeding: No  Past Medical History  Diagnosis Date  . TOBACCO DEPENDENCE 03/12/2006    Qualifier: Diagnosis of  By: Eusebio Friendly    . CVA 03/12/2006    Qualifier: Diagnosis of  By: Eusebio Friendly    . Stroke ([place])   . Atrial fibrillation ([place])   . Atherosclerotic cardiovascular disease   . Hypertrophic cardiomyopathy ([place])   . PVD (peripheral vascular disease) ([place])   . Carotid disease, bilateral ([place])   . Dyslipidemia   . Systemic hypertension     Past Surgical History  Procedure Laterality Date  . Vein surgery  1999 & 2000  . Cardiac catheterization  01/27/2006    total occlusion of the mid RCA,80% stenosis of the distal left main, total occlusion of the mid LAD following the first septal & first diagonal arteries,total occlusion of the left CX followint the first oblique marginal artery, reconstitution of the distal LAD and distal RCA via collaterals  . Nm myocar perf wall motion  11/25/2005    intermediate risk - mild inferolateral ischemia towards the apex extending to mid LV.    Current Outpatient Prescriptions on File Prior to Visit  Medication Sig Dispense Refill  . albuterol (PROVENTIL) (2.5 MG/3ML) 0.083% nebulizer solution  Take 3 mLs (2.5 mg total) by nebulization every 6 (six) hours as needed for wheezing or shortness of breath. 150 mL 1  . albuterol-ipratropium (COMBIVENT) 18-103 MCG/ACT inhaler Inhale 1 puff into the lungs every 6 (six) hours as needed for wheezing or shortness of breath. 1 Inhaler 7  . aspirin (ADULT ASPIRIN EC LOW STRENGTH) 81 MG EC tablet Take 81 mg by mouth daily.     . cetirizine (ZYRTEC) 5 MG tablet TAKE 1 TABLET (5 MG TOTAL) BY MOUTH AT BEDTIME. 30 tablet 10  . ergocalciferol (VITAMIN D2) 50000 UNITS capsule Take 50,000 Units by mouth once a week. Unknown day of the week    . fluticasone (FLONASE) 50 MCG/ACT nasal spray Place 2 sprays into both nostrils daily. (Patient not taking: Reported on 09/28/2014) 16 g 6  . furosemide (LASIX) 40 MG tablet Take 1 tablet (40 mg total) by mouth daily. 30 tablet 2  . ipratropium-albuterol (DUONEB) 0.5-2.5 (3) MG/3ML SOLN Take 3 mLs by nebulization every 6 (six) hours as needed. 360 mL 1  . levofloxacin (LEVAQUIN) 500 MG tablet Take 1 tablet (500 mg total) by mouth daily. (Patient not taking: Reported on 09/28/2014) 7 tablet 0  . metoprolol (LOPRESSOR) 50 MG tablet Take 1 tablet (50 mg total) by mouth 2 (two) times daily. 60 tablet 11  . pantoprazole (PROTONIX) 20 MG tablet Take 2 tablets (40 mg total) by mouth  daily. 60 tablet 3  . simvastatin (ZOCOR) 40 MG tablet TAKE ONE-HALF TABLET BY MOUTH EVERY NIGHT AT BEDTIME 31 tablet 6   No current facility-administered medications on file prior to visit.    No Known Allergies  Social History   Social History  . Marital Status: Widowed    Spouse Name: N/A  . Number of Children: N/A  . Years of Education: N/A   Social History Main Topics  . Smoking status: Former Smoker -- 0.50 packs/day for 50 years    Types: Cigarettes    Start date: 01/13/1949    Quit date: 01/14/1999  . Smokeless tobacco: Never Used  . Alcohol Use: No  . Drug Use: No  . Sexual Activity: No   Other Topics Concern  . None    Social History Narrative    Family History  Problem Relation Age of Onset  . Diabetes Father   . Cancer Sister   . Hypertension Sister     ROS  Per HPI   Objective   BP 150/84 mmHg  Pulse 84  Temp(Src) 97.8 F (36.6 C) (Oral)  Ht 5\' 1"  (1.549 m)  Wt 117 lb (53.071 kg)  BMI 22.12 kg/m2  General: Well appearing, no distress HEENT: Pupils equal and reactive to light/accomodation. Extraocular movements intact bilaterally. Tympanic membranes not visualized secondary to cerumen impaction bilaterally. Nares patent bilaterally. Oropharnx clear and moist. No cervical adenopathy bilaterally Respiratory/Chest: Mild bibasilar crackles bilaterally, no wheezing Cardiovascular: regular rate and rhythm, no murmur Gastrointestinal: Soft, non-tender, non-distended Genitourinary: Not examined    Musculoskeletal: 5/5 strength of upper and lower extremities. Left ankle without tenderness. No deformities. 5/5 dorsi/plantar flexion. No swelling. Neuro: 2+ reflexes bilaterally Psychiatric: Slightly flat affect  Assessment and Plan    No orders of the defined types were placed in this encounter.    Health Maintenance Due  Topic Date Due  . DEXA SCAN  07/16/1989  . INFLUENZA VACCINE  08/14/2014    Labs: CBC, CMP, Lipid panel, TSH  Healthcare maintenance: unsure of which medications patient is taking. Recommended patient call the office regarding current medications she is taking. Recommend discontinue Protonix  Epigastric pain: Appears intermittent and atypical. Will watch for now. Revisit if continued to be a problem  Ankle pain: nothing on exam. Symptoms self limiting and not causing decreased function. Would prefer to manage conservatively with Tylenol prn

## 2014-11-13 NOTE — Patient Instructions (Addendum)
Thank you for coming to see me today. It was a pleasure. Today we talked about:   Blood pressure: Please continue your medication.  Reflux: I think it might be beneficial stop the Protonix and see if we can manage her reflux with avoiding foods.   Please make an appointment to see me in 3 months for a follow-up of chronic issues.  If you have any questions or concerns, please do not hesitate to call the office at [phone].  Sincerely,  Cordelia Poche, MD

## 2014-11-14 ENCOUNTER — Encounter: Payer: Self-pay | Admitting: Family Medicine

## 2014-12-06 ENCOUNTER — Ambulatory Visit (INDEPENDENT_AMBULATORY_CARE_PROVIDER_SITE_OTHER): Payer: Medicare Other | Admitting: *Deleted

## 2014-12-06 DIAGNOSIS — Z23 Encounter for immunization: Secondary | ICD-10-CM | POA: Diagnosis present

## 2014-12-13 ENCOUNTER — Other Ambulatory Visit: Payer: Self-pay | Admitting: *Deleted

## 2014-12-15 ENCOUNTER — Other Ambulatory Visit: Payer: Self-pay | Admitting: *Deleted

## 2014-12-15 NOTE — Telephone Encounter (Signed)
We had discussed at last office visit to discontinue this medication. If this has changed, will send refill

## 2014-12-21 NOTE — Telephone Encounter (Signed)
Please inquire with family as to if patient is still using this medication. We had discussed stopping it at our last visit. Thanks!

## 2015-01-09 NOTE — Telephone Encounter (Signed)
LMOVM asking for return call. Bethany Cardenas, [name]

## 2015-01-12 NOTE — Telephone Encounter (Signed)
Spoke with niece margaret and informed her that we received a refill request from the pharmacy but would not be filling it.  She voiced understanding that patient was supposed to stop medication after last visit.  Marranda Arakelian,CMA

## 2015-01-18 ENCOUNTER — Other Ambulatory Visit: Payer: Self-pay | Admitting: *Deleted

## 2015-02-02 DIAGNOSIS — L602 Onychogryphosis: Secondary | ICD-10-CM | POA: Diagnosis not present

## 2015-02-02 DIAGNOSIS — D2371 Other benign neoplasm of skin of right lower limb, including hip: Secondary | ICD-10-CM | POA: Diagnosis not present

## 2015-03-09 ENCOUNTER — Encounter: Payer: Self-pay | Admitting: Internal Medicine

## 2015-03-09 ENCOUNTER — Ambulatory Visit (INDEPENDENT_AMBULATORY_CARE_PROVIDER_SITE_OTHER): Payer: Medicare Other | Admitting: Internal Medicine

## 2015-03-09 VITALS — BP 155/77 | HR 89 | Temp 97.8°F | Wt 115.8 lb

## 2015-03-09 DIAGNOSIS — M79605 Pain in left leg: Secondary | ICD-10-CM

## 2015-03-09 DIAGNOSIS — J449 Chronic obstructive pulmonary disease, unspecified: Secondary | ICD-10-CM

## 2015-03-09 DIAGNOSIS — R1013 Epigastric pain: Secondary | ICD-10-CM | POA: Insufficient documentation

## 2015-03-09 DIAGNOSIS — J441 Chronic obstructive pulmonary disease with (acute) exacerbation: Secondary | ICD-10-CM | POA: Diagnosis not present

## 2015-03-09 DIAGNOSIS — I10 Essential (primary) hypertension: Secondary | ICD-10-CM | POA: Diagnosis not present

## 2015-03-09 MED ORDER — CETIRIZINE HCL 5 MG PO TABS: ORAL_TABLET | ORAL | Status: DC

## 2015-03-09 MED ORDER — IPRATROPIUM-ALBUTEROL 18-103 MCG/ACT IN AERO: 1.0000 | INHALATION_SPRAY | Freq: Four times a day (QID) | RESPIRATORY_TRACT | Status: DC | PRN

## 2015-03-09 MED ORDER — SIMVASTATIN 40 MG PO TABS: ORAL_TABLET | ORAL | Status: DC

## 2015-03-09 MED ORDER — FUROSEMIDE 40 MG PO TABS: 40.0000 mg | ORAL_TABLET | Freq: Every day | ORAL | Status: DC

## 2015-03-09 MED ORDER — ALBUTEROL SULFATE (2.5 MG/3ML) 0.083% IN NEBU: 2.5000 mg | INHALATION_SOLUTION | Freq: Four times a day (QID) | RESPIRATORY_TRACT | Status: DC | PRN

## 2015-03-09 NOTE — Assessment & Plan Note (Signed)
Initial BP slightly elevated, but well controlled on initial repeat.  -continue Metoprolol 50 mg BID  -continue to monitor BP at home

## 2015-03-09 NOTE — Assessment & Plan Note (Signed)
Currently well controlled. Unable to obtain combivent in different administration form. As patient is mostly at home and has access to duoneb, niece wishes to keep Combivent as is and will help administer prn.  -continue current medication regimen, refills sent  -discussed warning signs for COPD exacerbation  -niece to call if decides administration methods of medications need to be changed

## 2015-03-09 NOTE — Progress Notes (Signed)
Subjective:    Bethany Cardenas - 82 y.o. female MRN [id number]  Date of birth: 1922-11-21  HPI  AIZLEY FRACASSI is here for follow-up visit for chronic issues.  1. COPD: Symptoms have been stable. No coughing and no fevers. No recent URI like symptoms. She uses her combivent about 1-2 times per week; however, niece reports some difficulty of patient administering her own medications. She uses her albuterol nebulizer most nights before bedtime.   2. HTN: Reports daily compliance with Metoprolol. Denies chest pain, severe headaches, and LE edema.   3. Left Ankle pain/shin pain: Reports pain a few times a week first thing in the early morning. Says she feels stiff but symptoms improve with Tylenol and movement as she starts her day. Denies any recent falls or trauma to the area.   4. Epigastric Pain: Pain is occasional and often occurs after she eats. Is sometimes worse when lying down after dinner. No nausea or vomiting. Normal bowel movements. Protonix was discontinued by PCP in Oct 2016.    Health Maintenance Due  Topic Date Due  . DEXA SCAN  07/16/1989    -  reports that she quit smoking about 16 years ago. Her smoking use included Cigarettes. She started smoking about 66 years ago. She has a 25 pack-year smoking history. She has never used smokeless tobacco. - Review of Systems: Per HPI. - Past Medical History: Patient Active Problem List   Diagnosis Date Noted  . Abdominal pain, epigastric 03/09/2015  . COPD exacerbation ([place]) 04/27/2014  . Decreased mobility 11/23/2013  . Atrial fibrillation, persistent ([place]) 07/30/2013  . CAD (coronary artery disease), native coronary artery 07/30/2013  . Carotid stenosis 07/30/2013  . Need for home health care 04/07/2013  . Allergic rhinitis 01/25/2013  . Dysphagia, unspecified(787.20) 01/13/2013  . Change in voice 01/10/2013  . Vitamin D deficiency 07/03/2012  . Left leg pain 07/02/2012  . Cautious gait 04/11/2010  . Chronic diastolic  heart failure ([place]) 12/10/2009  . CORONARY ARTERY DISEASE 08/12/2006  . HYPERCHOLESTEROLEMIA 03/12/2006  . DEMENTIA, NOT SPECIFIED 03/12/2006  . HYPERTENSION, BENIGN SYSTEMIC 03/12/2006  . Atherosclerosis of native arteries of extremity with intermittent claudication ([place]) 03/12/2006  . COPD (chronic obstructive pulmonary disease) ([place]) 03/12/2006  . OSTEOARTHRITIS, LOWER LEG 03/12/2006   - Medications: reviewed and updated Current Outpatient Prescriptions  Medication Sig Dispense Refill  . albuterol (PROVENTIL) (2.5 MG/3ML) 0.083% nebulizer solution Take 3 mLs (2.5 mg total) by nebulization every 6 (six) hours as needed for wheezing or shortness of breath. 150 mL 1  . albuterol-ipratropium (COMBIVENT) 18-103 MCG/ACT inhaler Inhale 1 puff into the lungs every 6 (six) hours as needed for wheezing or shortness of breath. 1 Inhaler 1  . aspirin (ADULT ASPIRIN EC LOW STRENGTH) 81 MG EC tablet Take 81 mg by mouth daily.     . cetirizine (ZYRTEC) 5 MG tablet TAKE 1 TABLET (5 MG TOTAL) BY MOUTH AT BEDTIME. 30 tablet 10  . ergocalciferol (VITAMIN D2) 50000 UNITS capsule Take 50,000 Units by mouth once a week. Unknown day of the week    . fluticasone (FLONASE) 50 MCG/ACT nasal spray Place 2 sprays into both nostrils daily. (Patient not taking: Reported on 09/28/2014) 16 g 6  . furosemide (LASIX) 40 MG tablet Take 1 tablet (40 mg total) by mouth daily. 30 tablet 2  . ipratropium-albuterol (DUONEB) 0.5-2.5 (3) MG/3ML SOLN Take 3 mLs by nebulization every 6 (six) hours as needed. 360 mL 1  . metoprolol (LOPRESSOR) 50  MG tablet Take 1 tablet (50 mg total) by mouth 2 (two) times daily. 60 tablet 11  . pantoprazole (PROTONIX) 20 MG tablet Take 2 tablets (40 mg total) by mouth daily. 60 tablet 3  . simvastatin (ZOCOR) 40 MG tablet TAKE ONE-HALF TABLET BY MOUTH EVERY NIGHT AT BEDTIME 31 tablet 6   No current facility-administered medications for this visit.    Objective:   Physical Exam BP 155/77 mmHg   Pulse 89  Temp(Src) 97.8 F (36.6 C) (Oral)  Wt 115 lb 12.8 oz (52.527 kg)  SpO2 97% Gen: NAD, well-appearing CV: RRR, good S1/S2, no murmur, no LE edema Resp: CTABL, no wheezes, non-labored MSK: No TTP of left ankle or leg. Strength 5/5 in LE bilaterally. Dorsiflexion and plantar flexion intact in left LE.  Sensation grossly intact in left LE.         Assessment & Plan:   HYPERTENSION, BENIGN SYSTEMIC Initial BP slightly elevated, but well controlled on initial repeat.  -continue Metoprolol 50 mg BID  -continue to monitor BP at home   COPD (chronic obstructive pulmonary disease) Currently well controlled. Unable to obtain combivent in different administration form. As patient is mostly at home and has access to duoneb, niece wishes to keep Combivent as is and will help administer prn.  -continue current medication regimen, refills sent  -discussed warning signs for COPD exacerbation  -niece to call if decides administration methods of medications need to be changed   Abdominal pain, epigastric Per history, seems consistent with reflux. Discussed the risks of PPIs in the elderly and why PCP likely discontinued this medication. Patient has EGD from 2015 with results consistent with GERD.  -niece wishes to try a trial of Tums which I think is reasonable start  -if pain persists, worsens, or becomes associated with dysphagia would recommend further investigation/treatment  -f/u with PCP in 3 months   Left leg pain As pain is not very bothersome and only sometimes in the early mornings, will continue to treat conservatively. Concern for fracture to the area low as patient has not had recent fall or trauma and physical exam was benign. Concern for DVT low as patient has had long history of this pain and there is no associated swelling or TTP on exam.  -continue with Tylenol -heat to the area prn -f/u with PCP      Phill Myron, D.O. 03/09/2015, 9:52 AM PGY-1, [place]

## 2015-03-09 NOTE — Patient Instructions (Signed)
Nice to meet you today! Please follow up with Dr. Lonny Prude in 3 months or earlier if needed.   Take Care,   Dr. Juleen China

## 2015-03-09 NOTE — Assessment & Plan Note (Addendum)
Per history, seems consistent with reflux. Discussed the risks of PPIs in the elderly and why PCP likely discontinued this medication. Patient has EGD from 2015 with results consistent with GERD.  -niece wishes to try a trial of Tums which I think is reasonable start  -if pain persists, worsens, or becomes associated with dysphagia would recommend further investigation/treatment  -f/u with PCP in 3 months

## 2015-03-09 NOTE — Assessment & Plan Note (Signed)
As pain is not very bothersome and only sometimes in the early mornings, will continue to treat conservatively. Concern for fracture to the area low as patient has not had recent fall or trauma and physical exam was benign. Concern for DVT low as patient has had long history of this pain and there is no associated swelling or TTP on exam.  -continue with Tylenol -heat to the area prn -f/u with PCP

## 2015-06-01 DIAGNOSIS — L602 Onychogryphosis: Secondary | ICD-10-CM | POA: Diagnosis not present

## 2015-06-01 DIAGNOSIS — D2371 Other benign neoplasm of skin of right lower limb, including hip: Secondary | ICD-10-CM | POA: Diagnosis not present

## 2015-07-21 ENCOUNTER — Other Ambulatory Visit: Payer: Self-pay | Admitting: Family Medicine

## 2015-07-29 ENCOUNTER — Other Ambulatory Visit: Payer: Self-pay | Admitting: Internal Medicine

## 2015-08-01 ENCOUNTER — Other Ambulatory Visit: Payer: Self-pay | Admitting: Internal Medicine

## 2015-08-01 NOTE — Telephone Encounter (Signed)
LM on home number ok per DPR asking patient to call back and schedule an appt with her PCP for medication refills. Bethany Cardenas,CMA

## 2015-08-01 NOTE — Telephone Encounter (Signed)
Refilled x2 months.  Please have patient schedule appt to follow up with me.  It appears she was supposed to follow up with Dr Lonny Prude in May but did not do this.

## 2015-08-03 NOTE — Telephone Encounter (Signed)
Refilled x2 months.  Please have patient schedule a visit to see me for chronic illnesses.

## 2015-08-03 NOTE — Telephone Encounter (Signed)
Letter mailed to patient since I have already left a message for her to call back and schedule an appt. Jazmin Hartsell,CMA

## 2015-08-09 ENCOUNTER — Other Ambulatory Visit: Payer: Self-pay | Admitting: *Deleted

## 2015-08-09 DIAGNOSIS — J441 Chronic obstructive pulmonary disease with (acute) exacerbation: Secondary | ICD-10-CM

## 2015-08-09 NOTE — Telephone Encounter (Signed)
Received faxed refill request from [facility] [id number] for iprat-albut 0.5-3(2.5) mg/3 mL. States, "Need diagnosis code of J44.9 before we can fill with Medicare B" DUCATTE, Orvis Brill, RN

## 2015-08-13 MED ORDER — ALBUTEROL SULFATE (2.5 MG/3ML) 0.083% IN NEBU: 2.5000 mg | INHALATION_SOLUTION | Freq: Four times a day (QID) | RESPIRATORY_TRACT | 1 refills | Status: DC | PRN

## 2015-09-03 ENCOUNTER — Encounter: Payer: Self-pay | Admitting: Family Medicine

## 2015-09-03 ENCOUNTER — Ambulatory Visit (INDEPENDENT_AMBULATORY_CARE_PROVIDER_SITE_OTHER): Payer: Medicare Other | Admitting: Family Medicine

## 2015-09-03 VITALS — BP 140/71 | HR 84 | Temp 98.0°F | Ht 61.0 in | Wt 113.0 lb

## 2015-09-03 DIAGNOSIS — J449 Chronic obstructive pulmonary disease, unspecified: Secondary | ICD-10-CM

## 2015-09-03 DIAGNOSIS — I10 Essential (primary) hypertension: Secondary | ICD-10-CM

## 2015-09-03 MED ORDER — METOPROLOL TARTRATE 50 MG PO TABS: 50.0000 mg | ORAL_TABLET | Freq: Two times a day (BID) | ORAL | 11 refills | Status: DC

## 2015-09-03 MED ORDER — ALBUTEROL SULFATE (2.5 MG/3ML) 0.083% IN NEBU
2.5000 mg | INHALATION_SOLUTION | Freq: Four times a day (QID) | RESPIRATORY_TRACT | 1 refills | Status: DC | PRN
Start: 2015-09-03 — End: 2016-10-21

## 2015-09-03 NOTE — Progress Notes (Signed)
    Subjective: CC: HTN, refills HPI: Bethany Cardenas is a 82 y.o. female presenting to clinic today for office visit. Concerns today include:  1. Hypertension Blood pressure today: 181/73, recheck 140/71 Meds: Compliant with Metoprolol 50mg  BID ROS: Denies headache, dizziness, visual changes, nausea, vomiting, chest pain, abdominal pain.  Endorses shortness of breath that is chronic and improved with inhalers.  2. COPD She reports that she no longer uses Combivent.  Uses albuterol maybe once weekly.  No cough, fevers, night time cough, SOB.  Social History Reviewed: non smoker. FamHx and MedHx reviewed.  Please see EMR. Health Maintenance: DEXA  ROS: Per HPI  Objective: Office vital signs reviewed. BP 140/71 (BP Location: Left Arm, Patient Position: Sitting, Cuff Size: Normal)   Pulse 84   Temp 98 F (36.7 C) (Oral)   Ht 5\' 1"  (1.549 m)   Wt 113 lb (51.3 kg)   SpO2 100%   BMI 21.35 kg/m   Physical Examination:  General: Awake, alert, well nourished, elderly female, No acute distress Cardio: regular rate and rhythm, S1S2 heard, no murmurs appreciated Pulm: globally decreased breath sounds, ?poor respiratory effort, no apparent wheeze or rales.  Normal WOB on room air.  Pulse ox 100%.  Assessment/ Plan: 82 y.o. female   HYPERTENSION, BENIGN SYSTEMIC BP initially elevated.  However, recheck WNL for age.  HR WNL. Continue Metoprolol, appropriate BB given h/o COPD.  Refill sent in.  COPD (chronic obstructive pulmonary disease) Patient w/ globally decreased breath sounds.  Unsure if this is from poor respiratory effort or chronic changes associated with COPD.  She is not needing albuterol more than one to two times weekly.  Could consider Advair/ pulmicort or Spiriva if need for albuterol increases.  Follow up in 3 months for annual exam w/ fasting labs. Need to consider risk vs benefit of Statin in this 82 yo female and discuss at next appt.    Bethany Norlander,  DO PGY-3, [facility]

## 2015-09-03 NOTE — Assessment & Plan Note (Signed)
Patient w/ globally decreased breath sounds.  Unsure if this is from poor respiratory effort or chronic changes associated with COPD.  She is not needing albuterol more than one to two times weekly.  Could consider Advair/ pulmicort or Spiriva if need for albuterol increases.

## 2015-09-03 NOTE — Assessment & Plan Note (Signed)
BP initially elevated.  However, recheck WNL for age.  HR WNL. Continue Metoprolol, appropriate BB given h/o COPD.  Refill sent in.

## 2015-09-03 NOTE — Patient Instructions (Signed)
Use the combivent every 6 hours for the next 2 days then use if needed.  Continue to take your blood pressure medication daily as directed.   Schedule an appointment with [place] [facility] in the next 2 months.

## 2015-09-06 ENCOUNTER — Ambulatory Visit (INDEPENDENT_AMBULATORY_CARE_PROVIDER_SITE_OTHER): Payer: Medicare Other | Admitting: Cardiovascular Disease

## 2015-09-06 VITALS — BP 137/66 | HR 93 | Ht 61.0 in | Wt 113.0 lb

## 2015-09-06 DIAGNOSIS — I482 Chronic atrial fibrillation: Secondary | ICD-10-CM

## 2015-09-06 DIAGNOSIS — I4821 Permanent atrial fibrillation: Secondary | ICD-10-CM

## 2015-09-06 DIAGNOSIS — I10 Essential (primary) hypertension: Secondary | ICD-10-CM | POA: Diagnosis not present

## 2015-09-06 DIAGNOSIS — I2510 Atherosclerotic heart disease of native coronary artery without angina pectoris: Secondary | ICD-10-CM | POA: Diagnosis not present

## 2015-09-06 DIAGNOSIS — I6522 Occlusion and stenosis of left carotid artery: Secondary | ICD-10-CM

## 2015-09-06 DIAGNOSIS — I5032 Chronic diastolic (congestive) heart failure: Secondary | ICD-10-CM

## 2015-09-06 DIAGNOSIS — E7800 Pure hypercholesterolemia, unspecified: Secondary | ICD-10-CM

## 2015-09-06 DIAGNOSIS — I70213 Atherosclerosis of native arteries of extremities with intermittent claudication, bilateral legs: Secondary | ICD-10-CM

## 2015-09-06 NOTE — Patient Instructions (Signed)
Dr Croitoru recommends that you schedule a follow-up appointment in 12 months. You will receive a reminder letter in the mail two months in advance. If you don't receive a letter, please call our office to schedule the follow-up appointment.  If you need a refill on your cardiac medications before your next appointment, please call your pharmacy. 

## 2015-09-06 NOTE — Progress Notes (Signed)
Cardiology Office Note    Date:  09/08/2015   ID:  Bethany Cardenas, DOB Jun 19, 1922, MRN [id number]  PCP:  Ronnie Doss, DO  Cardiologist:   Sanda Klein, MD   chief complaint:Yearly follow-up   History of Present Illness:  Bethany Cardenas is a 82 y.o. female with long-standing permanent atrial fibrillation, CAD, PAD, diastolic CHF, hypertrophic cardiomyopathy, hyperlipidemia, HTN, here for follow-up.  Very little has changed over the last year for Bethany Cardenas. She remains very sedentary, frail and only ambulates through the house. As long as she keeps a slow pace she does not have problems with angina, dyspnea, dizziness, syncope, palpitations. She denies leg edema and has not had any new neurological complaints.  Cardiac catheterization in 2008 showed 80% stenosis of the left main coronary artery, total occlusion of the LAD artery following the first septal and first diagonal branches with reconstitution of the distal LAD via collaterals, total occlusion of the left circumflex coronary artery from the first oblique marginal artery, and total occlusion of the right coronary artery following the RV marginal branch, again with reconstitution of the distal vessel via collaterals. She was evaluated by cardiovascular surgery at that time and felt to be a poor candidate for bypass. She has done remarkably well on medical therapy.  Her carotid ultrasound study performed in 2013 showed a 50-69% obstruction in the left proximal internal carotid artery and a less than 50% stenosis on the right side, and in 2014 the study was described as showing "no significant ICA stenosis". In January 2015 she was admitted with transient acute dysphagia and dysphonia, but imaging studies did not show evidence of recent stroke. MRA showed a remote right basal ganglia infarct with occlusion of the M1 segment of the right middle cerebral artery.   In 2007, lower extremity arterial duplex ultrasound showed severely  reduced arterial flow to both feet. The right ABI was 0.42, left ABI was 0.39. The right proximal SFA was occluded with distal reconstitution. The right peroneal artery was occluded. Right posterior tibial artery was also occluded but reconstituted in the mid to distal segment. On the left side the arterial system was occluded downstream of the left mid SFA.  She is in permanent atrial fibrillation at least several years.   She has systemic hypertension and evidence of left ventricular hypertrophy. The angiographic appearance during left ventriculography suggested apical variant hypertrophic cardiomyopathy, but they also may be a component of hypertensive heart disease. By echocardiography and ventriculography she had preserved left ventricular systolic function in the past, with EF of 55-60% and concentric LVH by echo in January 2015. She bears a diagnosis of congestive heart failure but has not required hospitalization or any adjustment in her diuretic therapy in a very long time.  Past Medical History:  Diagnosis Date  . Atherosclerotic cardiovascular disease   . Atrial fibrillation ([place])   . Carotid disease, bilateral ([place])   . CVA 03/12/2006   Qualifier: Diagnosis of  By: Eusebio Friendly    . Dyslipidemia   . Hypertrophic cardiomyopathy ([place])   . PVD (peripheral vascular disease) ([place])   . Stroke ([place])   . Systemic hypertension   . TOBACCO DEPENDENCE 03/12/2006   Qualifier: Diagnosis of  By: Eusebio Friendly      Past Surgical History:  Procedure Laterality Date  . CARDIAC CATHETERIZATION  01/27/2006   total occlusion of the mid RCA,80% stenosis of the distal left main, total occlusion of the mid LAD following the first septal &  first diagonal arteries,total occlusion of the left CX followint the first oblique marginal artery, reconstitution of the distal LAD and distal RCA via collaterals  . NM MYOCAR PERF WALL MOTION  11/25/2005   intermediate risk - mild inferolateral ischemia  towards the apex extending to mid LV.  Marland Kitchen VEIN SURGERY  1999 & 2000    Current Medications: Outpatient Medications Prior to Visit  Medication Sig Dispense Refill  . albuterol (PROVENTIL) (2.5 MG/3ML) 0.083% nebulizer solution Take 3 mLs (2.5 mg total) by nebulization every 6 (six) hours as needed for wheezing or shortness of breath. Dx:  J44.9 150 mL 1  . albuterol-ipratropium (COMBIVENT) 18-103 MCG/ACT inhaler Inhale 1 puff into the lungs every 6 (six) hours as needed for wheezing or shortness of breath. 1 Inhaler 1  . aspirin (ADULT ASPIRIN EC LOW STRENGTH) 81 MG EC tablet Take 81 mg by mouth daily.     . cetirizine (ZYRTEC) 5 MG tablet TAKE 1 TABLET (5 MG TOTAL) BY MOUTH AT BEDTIME. 30 tablet 10  . ergocalciferol (VITAMIN D2) 50000 UNITS capsule Take 50,000 Units by mouth once a week. Unknown day of the week    . fluticasone (FLONASE) 50 MCG/ACT nasal spray Place 2 sprays into both nostrils daily. 16 g 6  . furosemide (LASIX) 40 MG tablet TAKE 1 TABLET (40 MG TOTAL) BY MOUTH DAILY. 30 tablet 2  . ipratropium-albuterol (DUONEB) 0.5-2.5 (3) MG/3ML SOLN TAKE 1 VIAL BY NEBULIZATION EVERY 6 (SIX) HOURS AS NEEDED. 360 mL 1  . metoprolol (LOPRESSOR) 50 MG tablet Take 1 tablet (50 mg total) by mouth 2 (two) times daily. 60 tablet 11  . pantoprazole (PROTONIX) 20 MG tablet Take 2 tablets (40 mg total) by mouth daily. 60 tablet 3  . simvastatin (ZOCOR) 40 MG tablet TAKE ONE-HALF TABLET BY MOUTH EVERY NIGHT AT BEDTIME 31 tablet 6   No facility-administered medications prior to visit.      Allergies:   Review of patient's allergies indicates no known allergies.   Social History   Social History  . Marital status: Widowed    Spouse name: N/A  . Number of children: N/A  . Years of education: N/A   Social History Main Topics  . Smoking status: Former Smoker    Packs/day: 0.50    Years: 50.00    Types: Cigarettes    Start date: 01/13/1949    Quit date: 01/14/1999  . Smokeless tobacco: Never Used   . Alcohol use No  . Drug use: No  . Sexual activity: No   Other Topics Concern  . Not on file   Social History Narrative  . No narrative on file     Family History:  The patient's family history includes Cancer in her sister; Diabetes in her father; Hypertension in her sister.   ROS:   Please see the history of present illness.    ROS All other systems reviewed and are negative.   PHYSICAL EXAM:   VS:  BP (!) 152/77 (BP Location: Right Arm, Patient Position: Sitting, Cuff Size: Normal)   Pulse 93   Ht 5\' 1"  (1.549 m)   Wt 113 lb (51.3 kg)   SpO2 97%   BMI 21.35 kg/m    Recheck BP 137/66  GEN: Well nourished, well developed, in no acute distress  HEENT: normal  Neck: no JVD, Bilateral carotid bruits, no masses Cardiac: irregular; no murmurs, rubs, or gallops,no edema, radial and femoral pulses are normal, pedal pulses are not palpable  Respiratory:  clear to auscultation bilaterally, normal work of breathing GI: soft, nontender, nondistended, + BS MS: no deformity or atrophy  Skin: warm and dry, no rash Neuro:  Alert and Oriented x 3, Strength and sensation are intact Psych: euthymic mood, full affect  Wt Readings from Last 3 Encounters:  09/06/15 113 lb (51.3 kg)  09/03/15 113 lb (51.3 kg)  03/09/15 115 lb 12.8 oz (52.5 kg)      Studies/Labs Reviewed:   EKG:  EKG is ordered today.  The ekg ordered today demonstrates Atrial fibrillation, left axis deviation, ST depression and T-wave inversion in leads V5-V6 and T-wave inversion in the inferior leads  Recent Labs: 11/13/2014: ALT 8; BUN 18; Creat 0.93; Hemoglobin 12.6; Platelets 170; Potassium 3.6; Sodium 138; TSH 0.699   Lipid Panel    Component Value Date/Time   CHOL 147 11/13/2014 1106   TRIG 58 11/13/2014 1106   HDL 66 11/13/2014 1106   CHOLHDL 2.2 11/13/2014 1106   VLDL 12 11/13/2014 1106   LDLCALC 69 11/13/2014 1106     ASSESSMENT:    1. Coronary artery disease involving native coronary artery  of native heart without angina pectoris      PLAN:  In order of problems listed above:  1. AFib: Well rate controlled. Asymptomatic. Despite very high embolic risk (CHADSVasc 7-8 - age 57, CVA 2, gender, PAD/CAD, HTN =/- CHF) she is also at high bleeding risk and we are treating with aspirin only. 2. CAD: . Multivessel disease with chronic total occlusion of all 3 major coronary arteries and collateral filling. He'll be a poor candidate for either surgical or percutaneous revascularization. Asymptomatic with her sedentary lifestyle. Preserved left ventricular systolic function. Continue aspirin, beta blocker, statin. 3. HTN: Controlled 4. PAD: Severe in both lower extremities, but asymptomatic (probably because she is so sedentary) and without evidence of threatened limb. 5. HLP: Satisfactory lipid profile on statin 6. CHF: No clinical signs of hypervolemia on the current low dose of diuretic. She has not required hospitalization for diuretic dose adjustment in years. 7. Carotid stenosis: conflicting results on her last 2 ultrasound studies. She would not be a good candidate for revascularization anyway. Serial studies are not recommended.    Medication Adjustments/Labs and Tests Ordered: Current medicines are reviewed at length with the patient today.  Concerns regarding medicines are outlined above.  Medication changes, Labs and Tests ordered today are listed in the Patient Instructions below. Patient Instructions  Dr Sallyanne Kuster recommends that you schedule a follow-up appointment in 12 months. You will receive a reminder letter in the mail two months in advance. If you don't receive a letter, please call our office to schedule the follow-up appointment.  If you need a refill on your cardiac medications before your next appointment, please call your pharmacy.    Signed, Sanda Klein, MD  09/08/2015 2:54 PM    [address], [place], [place]   [address] Phone: [phone]; Fax: [phone]

## 2015-09-08 ENCOUNTER — Encounter: Payer: Self-pay | Admitting: Cardiovascular Disease

## 2015-09-12 ENCOUNTER — Other Ambulatory Visit: Payer: Self-pay | Admitting: *Deleted

## 2015-09-12 MED ORDER — SIMVASTATIN 40 MG PO TABS: ORAL_TABLET | ORAL | 6 refills | Status: DC

## 2015-10-12 ENCOUNTER — Telehealth: Payer: Self-pay | Admitting: Family Medicine

## 2015-10-12 MED ORDER — PANTOPRAZOLE SODIUM 20 MG PO TBEC: 40.0000 mg | DELAYED_RELEASE_TABLET | Freq: Every day | ORAL | 0 refills | Status: DC

## 2015-10-12 NOTE — Telephone Encounter (Signed)
Pt needs a refill on her Protonix called in. jw

## 2015-11-02 ENCOUNTER — Other Ambulatory Visit: Payer: Self-pay | Admitting: *Deleted

## 2015-11-02 MED ORDER — PANTOPRAZOLE SODIUM 20 MG PO TBEC: 40.0000 mg | DELAYED_RELEASE_TABLET | Freq: Every day | ORAL | 0 refills | Status: DC

## 2015-11-02 NOTE — Telephone Encounter (Signed)
Refill request for 90 day supply.  Dominigue Gellner L, RN  

## 2015-11-09 ENCOUNTER — Other Ambulatory Visit: Payer: Self-pay | Admitting: Family Medicine

## 2015-12-13 ENCOUNTER — Ambulatory Visit (INDEPENDENT_AMBULATORY_CARE_PROVIDER_SITE_OTHER): Payer: Medicare Other | Admitting: *Deleted

## 2015-12-13 DIAGNOSIS — Z23 Encounter for immunization: Secondary | ICD-10-CM | POA: Diagnosis present

## 2015-12-21 ENCOUNTER — Other Ambulatory Visit: Payer: Self-pay | Admitting: Family Medicine

## 2016-01-24 ENCOUNTER — Encounter: Payer: Self-pay | Admitting: Family Medicine

## 2016-01-24 ENCOUNTER — Ambulatory Visit (INDEPENDENT_AMBULATORY_CARE_PROVIDER_SITE_OTHER): Payer: Medicare Other | Admitting: Family Medicine

## 2016-01-24 VITALS — BP 150/110 | HR 124 | Temp 97.9°F | Ht 61.0 in | Wt 110.2 lb

## 2016-01-24 DIAGNOSIS — I10 Essential (primary) hypertension: Secondary | ICD-10-CM | POA: Diagnosis not present

## 2016-01-24 DIAGNOSIS — J441 Chronic obstructive pulmonary disease with (acute) exacerbation: Secondary | ICD-10-CM | POA: Diagnosis present

## 2016-01-24 MED ORDER — PREDNISONE 20 MG PO TABS: 40.0000 mg | ORAL_TABLET | Freq: Every day | ORAL | 0 refills | Status: DC

## 2016-01-24 MED ORDER — IPRATROPIUM-ALBUTEROL 0.5-2.5 (3) MG/3ML IN SOLN: RESPIRATORY_TRACT | 1 refills | Status: DC

## 2016-01-24 MED ORDER — DOXYCYCLINE HYCLATE 100 MG PO TABS: 100.0000 mg | ORAL_TABLET | Freq: Two times a day (BID) | ORAL | 0 refills | Status: DC

## 2016-01-24 NOTE — Patient Instructions (Signed)
Chronic Obstructive Pulmonary Disease Exacerbation Chronic obstructive pulmonary disease (COPD) is a common lung condition in which airflow from the lungs is limited. COPD is a general term that can be used to describe many different lung problems that limit airflow, including chronic bronchitis and emphysema. COPD exacerbations are episodes when breathing symptoms become much worse and require extra treatment. Without treatment, COPD exacerbations can be life threatening, and frequent COPD exacerbations can cause further damage to your lungs. What are the causes?  Respiratory infections.  Exposure to smoke.  Exposure to air pollution, chemical fumes, or dust. Sometimes there is no apparent cause or trigger. What increases the risk?  Smoking cigarettes.  Older age.  Frequent prior COPD exacerbations. What are the signs or symptoms?  Increased coughing.  Increased thick spit (sputum) production.  Increased wheezing.  Increased shortness of breath.  Rapid breathing.  Chest tightness. How is this diagnosed? Your medical history, a physical exam, and tests will help your health care provider make a diagnosis. Tests may include:  A chest X-ray.  Basic lab tests.  Sputum testing.  An arterial blood gas test.  How is this treated? Depending on the severity of your COPD exacerbation, you may need to be admitted to a hospital for treatment. Some of the treatments commonly used to treat COPD exacerbations are:  Antibiotic medicines.  Bronchodilators. These are drugs that expand the air passages. They may be given with an inhaler or nebulizer. Spacer devices may be needed to help improve drug delivery.  Corticosteroid medicines.  Supplemental oxygen therapy.  Airway clearing techniques, such as noninvasive ventilation (NIV) and positive expiratory pressure (PEP). These provide respiratory support through a mask or other noninvasive device.  Follow these instructions at  home:  Do not smoke. Quitting smoking is very important to prevent COPD from getting worse and exacerbations from happening as often.  Avoid exposure to all substances that irritate the airway, especially to tobacco smoke.  If you were prescribed an antibiotic medicine, finish it all even if you start to feel better.  Take all medicines as directed by your health care provider.It is important to use correct technique with inhaled medicines.  Drink enough fluids to keep your urine clear or pale yellow (unless you have a medical condition that requires fluid restriction).  Use a cool mist vaporizer. This makes it easier to clear your chest when you cough.  If you have a home nebulizer and oxygen, continue to use them as directed.  Maintain all necessary vaccinations to prevent infections.  Exercise regularly.  Eat a healthy diet.  Keep all follow-up appointments as directed by your health care provider. Get help right away if:  You have worsening shortness of breath.  You have trouble talking.  You have severe chest pain.  You have blood in your sputum.  You have a fever.  You have weakness, vomit repeatedly, or faint.  You feel confused.  You continue to get worse. This information is not intended to replace advice given to you by your health care provider. Make sure you discuss any questions you have with your health care provider. Document Released: 10/27/2006 Document Revised: 06/07/2015 Document Reviewed: 09/03/2012 Elsevier Interactive Patient Education  2017 Elsevier Inc.  

## 2016-01-24 NOTE — Progress Notes (Signed)
   Subjective: CC: wheeze, cough [id number] Bethany Cardenas is a 83 y.o. female presenting to clinic today for same day appointment. PCP: Ronnie Doss, DO Concerns today include:  1. Wheeze, cough Patient is accompanied by her family to visit, who report a productive cough x7-10 days.  They report mild wheeze.  No CP, SOB, nausea, vomiting, abdominal pain.  Patient's sister was sick with similar symptoms recently.  Patient has been using her COPD inhalers as directed.  No fevers, chills.  Tolerating PO normally, though food intake has decreased slightly.  No myalgias.  Had flu shot this year.  2. HTN Patient did not take BP meds today.  She denies CP, SOB, nausea, vomiting, visual disturbance, diaphoresis  No Known Allergies  Social Hx reviewed.  MedHx, current medications and allergies reviewed.  Please see EMR. ROS: Per HPI  Objective: Office vital signs reviewed. BP (!) 150/110 (BP Location: Left Arm, Patient Position: Sitting, Cuff Size: Large)   Pulse (!) 124   Temp 97.9 F (36.6 C) (Oral)   Ht 5\' 1"  (1.549 m)   Wt 110 lb 3.2 oz (50 kg)   SpO2 99%   BMI 20.82 kg/m   Physical Examination:  General: Awake, alert, elderly female, No acute distress HEENT: Normal    Neck: No masses palpated. No lymphadenopathy    Ears: Tympanic membranes occluded by cerumen bilaterally.    Eyes: PERRLA, EOMI, sclera white    Nose: nasal turbinates moist, scant clear nasal discharge    Throat: moist mucus membranes, no erythema, no tonsillar exudate.  Airway is patent Cardio: slightly tachycardic, regular rhythm, S1S2 heard, no murmurs appreciated Pulm: faint expiratory wheezes, no rhonchi or rales; normal work of breathing on room air Ext: no edema  Assessment/ Plan: 83 y.o. female  1. COPD exacerbation ([place]).  Afebrile.  Nontoxic appearing.   - Doxy 100mg  BID x7 days - Prednisone 40mg  qd x5 days - Duonebs scheduled q6 x48 hours then prn - continue other scheduled inhalers - return  precautions reviewed - follow up prn  2. HYPERTENSION, BENIGN SYSTEMIC BP elevated.  Patient did not take antihypertensive today.  She will take this when she gets home. - Return precautions reviewed.    Janora Norlander, DO PGY-3, [facility]

## 2016-03-06 ENCOUNTER — Other Ambulatory Visit: Payer: Self-pay | Admitting: Family Medicine

## 2016-03-06 NOTE — Telephone Encounter (Signed)
Please call patient and inform her that I have sent in Protonix at a reduced dose, as these medications increase risk for osteoporosis/ fractures/ infections.

## 2016-03-07 NOTE — Telephone Encounter (Signed)
Spoke with pt niece and gave her the below message. [name], April D, [name]

## 2016-03-26 ENCOUNTER — Other Ambulatory Visit: Payer: Self-pay | Admitting: Internal Medicine

## 2016-05-28 DIAGNOSIS — M79672 Pain in left foot: Secondary | ICD-10-CM | POA: Diagnosis not present

## 2016-05-28 DIAGNOSIS — B351 Tinea unguium: Secondary | ICD-10-CM | POA: Diagnosis not present

## 2016-05-28 DIAGNOSIS — M79671 Pain in right foot: Secondary | ICD-10-CM | POA: Diagnosis not present

## 2016-05-28 DIAGNOSIS — I739 Peripheral vascular disease, unspecified: Secondary | ICD-10-CM | POA: Diagnosis not present

## 2016-05-28 DIAGNOSIS — L84 Corns and callosities: Secondary | ICD-10-CM | POA: Diagnosis not present

## 2016-06-12 ENCOUNTER — Other Ambulatory Visit: Payer: Self-pay | Admitting: Internal Medicine

## 2016-06-18 ENCOUNTER — Other Ambulatory Visit: Payer: Self-pay | Admitting: Internal Medicine

## 2016-06-26 ENCOUNTER — Other Ambulatory Visit: Payer: Self-pay | Admitting: Internal Medicine

## 2016-08-27 DIAGNOSIS — M79671 Pain in right foot: Secondary | ICD-10-CM | POA: Diagnosis not present

## 2016-08-27 DIAGNOSIS — M79672 Pain in left foot: Secondary | ICD-10-CM | POA: Diagnosis not present

## 2016-08-27 DIAGNOSIS — L84 Corns and callosities: Secondary | ICD-10-CM | POA: Diagnosis not present

## 2016-08-27 DIAGNOSIS — I739 Peripheral vascular disease, unspecified: Secondary | ICD-10-CM | POA: Diagnosis not present

## 2016-08-27 DIAGNOSIS — B351 Tinea unguium: Secondary | ICD-10-CM | POA: Diagnosis not present

## 2016-10-14 ENCOUNTER — Other Ambulatory Visit: Payer: Self-pay | Admitting: Family Medicine

## 2016-10-14 DIAGNOSIS — J449 Chronic obstructive pulmonary disease, unspecified: Secondary | ICD-10-CM

## 2016-10-18 ENCOUNTER — Other Ambulatory Visit: Payer: Self-pay | Admitting: Family Medicine

## 2016-10-21 ENCOUNTER — Other Ambulatory Visit: Payer: Self-pay | Admitting: *Deleted

## 2016-10-21 DIAGNOSIS — J449 Chronic obstructive pulmonary disease, unspecified: Secondary | ICD-10-CM

## 2016-10-21 MED ORDER — ALBUTEROL SULFATE (2.5 MG/3ML) 0.083% IN NEBU: 2.5000 mg | INHALATION_SOLUTION | Freq: Four times a day (QID) | RESPIRATORY_TRACT | 1 refills | Status: DC | PRN

## 2016-10-28 ENCOUNTER — Encounter: Payer: Self-pay | Admitting: Family Medicine

## 2016-10-28 ENCOUNTER — Ambulatory Visit (INDEPENDENT_AMBULATORY_CARE_PROVIDER_SITE_OTHER): Payer: Medicare Other | Admitting: Family Medicine

## 2016-10-28 VITALS — BP 152/84 | HR 107 | Temp 97.9°F | Ht 61.0 in | Wt 110.6 lb

## 2016-10-28 DIAGNOSIS — Z23 Encounter for immunization: Secondary | ICD-10-CM | POA: Diagnosis not present

## 2016-10-28 DIAGNOSIS — F0390 Unspecified dementia without behavioral disturbance: Secondary | ICD-10-CM

## 2016-10-28 NOTE — Progress Notes (Deleted)
    Subjective:  Bethany Cardenas is a 83 y.o. female who presents to the [facility] today with a chief complaint of ***.   HPI:  ***  ***  PMH: *** Tobacco use: *** Medication: reviewed and updated ROS: see HPI   Objective:  Physical Exam: BP (!) 152/84   Pulse (!) 107   Temp 97.9 F (36.6 C) (Oral)   Ht 5\' 1"  (1.549 m)   Wt 110 lb 9.6 oz (50.2 kg)   SpO2 98%   BMI 20.90 kg/m   Gen: ***NAD, resting comfortably CV: RRR with no murmurs appreciated Pulm: NWOB, CTAB with no crackles, wheezes, or rhonchi GI: Normal bowel sounds present. Soft, Nontender, Nondistended. MSK: no edema, cyanosis, or clubbing noted Skin: warm, dry Neuro: grossly normal, moves all extremities Psych: Normal affect and thought content  No results found for this or any previous visit (from the past 72 hour(s)).   Assessment/Plan:  No problem-specific Assessment & Plan notes found for this encounter.

## 2016-10-28 NOTE — Patient Instructions (Signed)
Bethany Cardenas was seen today for a check up and to have me fill out some paper work for a Optician, dispensing. I have no reason to believe that this will not go through. Please call me in 4-6 weeks if you do not hear anything.  Take care, Beda Dula L. Rosalyn Gess, [place] Resident PGY-2 10/28/2016 4:31 PM

## 2016-10-28 NOTE — Progress Notes (Signed)
    Subjective:  Bethany Cardenas is a 83 y.o. female who presents to the [facility] today with her niece for check up and to have paper work filled out for home health aid.   HPI:  Bethany Cardenas is 83 yo and has no current complaints aside from some mild cramping in her lower extremities. She lives with her sister who is also about the same age. She denies any CP, SOB, weakness, NVD, fevers, chills or weight loss. Her niece is concerned that since her sister now developed glaucoma and has lost her sight and they both need a greater level of supervision.     PMH:  Chronic diastolic heart failure, afib, CAD, COPD Tobacco use: none Medication: reviewed and updated ROS: see HPI   Objective:  Physical Exam: BP (!) 152/84   Pulse (!) 107   Temp 97.9 F (36.6 C) (Oral)   Ht 5\' 1"  (1.549 m)   Wt 110 lb 9.6 oz (50.2 kg)   SpO2 98%   BMI 20.90 kg/m   Gen: 83yo F in NAD, resting comfortably CV: RRR with no murmurs appreciated Pulm: NWOB, CTAB with no crackles, wheezes, or rhonchi GI: Normal bowel sounds present. Soft, Nontender, Nondistended. MSK: no edema, cyanosis, or clubbing noted Skin: warm, dry Neuro: grossly normal, moves all extremities Psych: Normal affect and thought content  No results found for this or any previous visit (from the past 72 hour(s)).   Assessment/Plan:  Dementia Lives with elderly sister in stable home, but does need some assistance for supervision and for getting out into the community. Patient does not drive. Not currently on any medication. No behavioral issues at this time. Filled out paperwork to attest for personal care services. Will send off paperwork when complete.   Healthcare maintenance Received flu-shot  Bethany Cardenas L. Rosalyn Gess, [place] Resident PGY-2 10/28/2016 4:29 PM

## 2016-11-12 ENCOUNTER — Other Ambulatory Visit: Payer: Self-pay | Admitting: Family Medicine

## 2016-11-12 DIAGNOSIS — I10 Essential (primary) hypertension: Secondary | ICD-10-CM

## 2016-11-27 ENCOUNTER — Ambulatory Visit (INDEPENDENT_AMBULATORY_CARE_PROVIDER_SITE_OTHER): Payer: Medicare Other | Admitting: Cardiovascular Disease

## 2016-11-27 ENCOUNTER — Encounter: Payer: Self-pay | Admitting: Cardiovascular Disease

## 2016-11-27 VITALS — BP 186/91 | HR 106 | Wt 111.6 lb

## 2016-11-27 DIAGNOSIS — I482 Chronic atrial fibrillation: Secondary | ICD-10-CM

## 2016-11-27 DIAGNOSIS — E7800 Pure hypercholesterolemia, unspecified: Secondary | ICD-10-CM | POA: Diagnosis not present

## 2016-11-27 DIAGNOSIS — I5032 Chronic diastolic (congestive) heart failure: Secondary | ICD-10-CM

## 2016-11-27 DIAGNOSIS — I2510 Atherosclerotic heart disease of native coronary artery without angina pectoris: Secondary | ICD-10-CM

## 2016-11-27 DIAGNOSIS — I6522 Occlusion and stenosis of left carotid artery: Secondary | ICD-10-CM | POA: Diagnosis not present

## 2016-11-27 DIAGNOSIS — I10 Essential (primary) hypertension: Secondary | ICD-10-CM | POA: Diagnosis not present

## 2016-11-27 DIAGNOSIS — I4821 Permanent atrial fibrillation: Secondary | ICD-10-CM

## 2016-11-27 MED ORDER — METOPROLOL SUCCINATE ER 100 MG PO TB24: 100.0000 mg | ORAL_TABLET | Freq: Every day | ORAL | 3 refills | Status: DC

## 2016-11-27 NOTE — Patient Instructions (Signed)
Dr Sallyanne Kuster has recommended making the following medication changes: 1. STOP Metoprolol TARTRATE (Lopressor) 2. START Metoprolol SUCCINATE 100 mg - take 1 tablet by mouth once daily  Your physician recommends that you schedule a follow-up appointment in 12 months. You will receive a reminder letter in the mail two months in advance. If you don't receive a letter, please call our office to schedule the follow-up appointment.  If you need a refill on your cardiac medications before your next appointment, please call your pharmacy.

## 2016-11-27 NOTE — Progress Notes (Signed)
Cardiology Office Note    Date:  11/27/2016   ID:  Bethany Cardenas, DOB 1922-12-26, MRN [id number]  PCP:  Eloise Levels, MD  Cardiologist:   Sanda Klein, MD   chief complaint:follow-up for CAD, CHF, atrial fibrillation   History of Present Illness:  Bethany Cardenas is a 83 y.o. female with long-standing permanent atrial fibrillation, CAD, PAD, diastolic CHF, hypertrophic cardiomyopathy, hyperlipidemia, HTN, here for follow-up.  [name] has no complaints.  She has not required hospitalization or diuretic dose adjustment for heart failure.  She is unaware of irregular heart rhythm.  She has not had focal neurological events to suggest stroke or TIA.  She remains very unsteady on her feet and prone to falling.  She has not had leg edema and denies orthopnea or PND or any angina at rest or with activity.  Her prescription for metoprolol ran out towards the beginning of this week and today she is mildly tachycardic and mildly to moderately hypertensive.  Cardiac catheterization in 2008 showed 80% stenosis of the left main coronary artery, total occlusion of the LAD artery following the first septal and first diagonal branches with reconstitution of the distal LAD via collaterals, total occlusion of the left circumflex coronary artery from the first oblique marginal artery, and total occlusion of the right coronary artery following the RV marginal branch, again with reconstitution of the distal vessel via collaterals. She was evaluated by cardiovascular surgery at that time and felt to be a poor candidate for bypass. She has done remarkably well on medical therapy.  Her carotid ultrasound study performed in 2013 showed a 50-69% obstruction in the left proximal internal carotid artery and a less than 50% stenosis on the right side, and in 2014 the study was described as showing "no significant ICA stenosis". In January 2015 she was admitted with transient acute dysphagia and dysphonia, but  imaging studies did not show evidence of recent stroke. MRA showed a remote right basal ganglia infarct with occlusion of the M1 segment of the right middle cerebral artery.   In 2007, lower extremity arterial duplex ultrasound showed severely reduced arterial flow to both feet. The right ABI was 0.42, left ABI was 0.39. The right proximal SFA was occluded with distal reconstitution. The right peroneal artery was occluded. Right posterior tibial artery was also occluded but reconstituted in the mid to distal segment. On the left side the arterial system was occluded downstream of the left mid SFA.  She is in permanent atrial fibrillation for at least several years.   She has systemic hypertension and evidence of left ventricular hypertrophy. The angiographic appearance during left ventriculography suggested apical variant hypertrophic cardiomyopathy, but they also may be a component of hypertensive heart disease. By echocardiography and ventriculography she had preserved left ventricular systolic function in the past, with EF of 55-60% and concentric LVH by echo in January 2015. She bears a diagnosis of congestive heart failure but has not required hospitalization or any adjustment in her diuretic therapy in a very long time.  Past Medical History:  Diagnosis Date  . Atherosclerotic cardiovascular disease   . Atrial fibrillation ([place])   . Carotid disease, bilateral ([place])   . CVA 03/12/2006   Qualifier: Diagnosis of  By: Bethany Cardenas    . Dyslipidemia   . Hypertrophic cardiomyopathy ([place])   . PVD (peripheral vascular disease) ([place])   . Stroke ([place])   . Systemic hypertension   . TOBACCO DEPENDENCE 03/12/2006   Qualifier: Diagnosis  of  By: Bethany Cardenas      Past Surgical History:  Procedure Laterality Date  . CARDIAC CATHETERIZATION  01/27/2006   total occlusion of the mid RCA,80% stenosis of the distal left main, total occlusion of the mid LAD following the first septal & first  diagonal arteries,total occlusion of the left CX followint the first oblique marginal artery, reconstitution of the distal LAD and distal RCA via collaterals  . NM MYOCAR PERF WALL MOTION  11/25/2005   intermediate risk - mild inferolateral ischemia towards the apex extending to mid LV.  Marland Kitchen VEIN SURGERY  1999 & 2000    Current Medications: Outpatient Medications Prior to Visit  Medication Sig Dispense Refill  . albuterol (PROVENTIL) (2.5 MG/3ML) 0.083% nebulizer solution Take 3 mLs (2.5 mg total) by nebulization every 6 (six) hours as needed for wheezing or shortness of breath. Dx:  J44.9 150 mL 1  . albuterol-ipratropium (COMBIVENT) 18-103 MCG/ACT inhaler Inhale 1 puff into the lungs every 6 (six) hours as needed for wheezing or shortness of breath. 1 Inhaler 1  . aspirin (ADULT ASPIRIN EC LOW STRENGTH) 81 MG EC tablet Take 81 mg by mouth daily.     . cetirizine (ZYRTEC) 5 MG tablet TAKE 1 TABLET (5 MG TOTAL) BY MOUTH AT BEDTIME. 90 tablet 0  . ergocalciferol (VITAMIN D2) 50000 UNITS capsule Take 50,000 Units by mouth once a week. Unknown day of the week    . furosemide (LASIX) 40 MG tablet TAKE 1 TABLET EVERY DAY 30 tablet 12  . ipratropium-albuterol (DUONEB) 0.5-2.5 (3) MG/3ML SOLN TAKE 1 VIAL BY NEBULIZATION EVERY 6 (SIX) HOURS AS NEEDED. 360 mL 1  . simvastatin (ZOCOR) 40 MG tablet TAKE ONE-HALF TABLET BY MOUTH EVERY NIGHT AT BEDTIME 31 tablet 6  . metoprolol (LOPRESSOR) 50 MG tablet Take 1 tablet (50 mg total) by mouth 2 (two) times daily. 60 tablet 11  . pantoprazole (PROTONIX) 20 MG tablet Take 1 tablet (20 mg total) by mouth daily. 30 tablet 5  . doxycycline (VIBRA-TABS) 100 MG tablet Take 1 tablet (100 mg total) by mouth 2 (two) times daily. 14 tablet 0  . fluticasone (FLONASE) 50 MCG/ACT nasal spray Place 2 sprays into both nostrils daily. 16 g 6  . predniSONE (DELTASONE) 20 MG tablet Take 2 tablets (40 mg total) by mouth daily with breakfast. 10 tablet 0   No facility-administered  medications prior to visit.      Allergies:   Patient has no known allergies.   Social History   Socioeconomic History  . Marital status: Widowed    Spouse name: None  . Number of children: None  . Years of education: None  . Highest education level: None  Social Needs  . Financial resource strain: None  . Food insecurity - worry: None  . Food insecurity - inability: None  . Transportation needs - medical: None  . Transportation needs - non-medical: None  Occupational History  . None  Tobacco Use  . Smoking status: Former Smoker    Packs/day: 0.50    Years: 50.00    Pack years: 25.00    Types: Cigarettes    Start date: 01/13/1949    Last attempt to quit: 01/14/1999    Years since quitting: 17.8  . Smokeless tobacco: Never Used  Substance and Sexual Activity  . Alcohol use: No    Alcohol/week: 0.0 oz  . Drug use: No  . Sexual activity: No  Other Topics Concern  . None  Social History  Narrative  . None     Family History:  The patient's family history includes Cancer in her sister; Diabetes in her father; Hypertension in her sister.   ROS:   Please see the history of present illness.    ROS All other systems reviewed and are negative.   PHYSICAL EXAM:   VS:  BP (!) 186/91   Pulse (!) 106   Wt 111 lb 9.6 oz (50.6 kg)   BMI 21.09 kg/m    Recheck BP 137/66   General: Alert, oriented x3, no distress, appears very old and frail, but smiling and very comfortable Head: no evidence of trauma, PERRL, EOMI, no exophtalmos or lid lag, no myxedema, no xanthelasma; normal ears, nose and oropharynx Neck: normal jugular venous pulsations and no hepatojugular reflux; brisk carotid pulses without delay and no carotid bruits Chest: clear to auscultation, no signs of consolidation by percussion or palpation, normal fremitus, symmetrical and full respiratory excursions Cardiovascular: normal position and quality of the apical impulse, irregular rhythm, normal first and second  heart sounds, no murmurs, rubs or gallops Abdomen: no tenderness or distention, no masses by palpation, no abnormal pulsatility or arterial bruits, normal bowel sounds, no hepatosplenomegaly Extremities: no clubbing, cyanosis or edema; 2+ radial, ulnar and brachial pulses bilaterally; 2+ right femoral, thready posterior tibial and dorsalis pedis pulses; 2+ left femoral, thready posterior tibial and dorsalis pedis pulses; no subclavian bruits, bilateral femoral bruits are present Neurological: grossly nonfocal Psych: Normal mood and affect   Wt Readings from Last 3 Encounters:  11/27/16 111 lb 9.6 oz (50.6 kg)  10/28/16 110 lb 9.6 oz (50.2 kg)  01/24/16 110 lb 3.2 oz (50 kg)      Studies/Labs Reviewed:   EKG:  EKG is ordered today.  The ekg ordered today demonstrates atrial fibrillation with mild rapid ventricular response, old lateral ST segment depression and T wave inversion.  Recent Labs: No results found for requested labs within last 8760 hours.   Lipid Panel    Component Value Date/Time   CHOL 147 11/13/2014 1106   TRIG 58 11/13/2014 1106   HDL 66 11/13/2014 1106   CHOLHDL 2.2 11/13/2014 1106   VLDL 12 11/13/2014 1106   LDLCALC 69 11/13/2014 1106     ASSESSMENT:    1. Permanent atrial fibrillation ([place])   2. Coronary artery disease involving native coronary artery of native heart without angina pectoris   3. HYPERTENSION, BENIGN SYSTEMIC   4. HYPERCHOLESTEROLEMIA   5. Chronic diastolic heart failure (HCC)   6. Stenosis of left carotid artery      PLAN:  In order of problems listed above:  1. AFib: Rapid rate at rest today is likely due to discontinuation of beta-blocker.  Will restart the metoprolol.  For convenience will change to metoprolol succinate dose once daily. Asymptomatic. Despite very high embolic risk (CHADSVasc 7-8 - age 32, CVA 2, gender, PAD/CAD, HTN =/- CHF) she is also at high bleeding risk and we are treating with aspirin only.  Reviewed the risk  of beta-blocker rebound with her caregiver. 2. CAD: She has done remarkably well with medical therapy considering that she has chronic total occlusion of all 3 major coronary arteries with collateral filling of distal vessels.  When last checked, left ventricular systolic function was still normal.  Continue treatment with aspirin, beta-blocker and statin.   3. HTN: Usually well controlled, probably some degree of beta-blocker rebound at this point.  She is not symptomatic; restart beta-blocker 4. PAD: She is  asymptomatic, largely because she is so sedentary.  No clinical evidence of threatened limb ischemia. 5. HLP: On statin 6. CHF: Functional status difficult to assess due to sedentary lifestyle, but she appears clinically euvolemic. 7. Carotid stenosis: No focal neurological events.  Since she is a poor revascularization candidate we have decided not to follow with routine ultrasound studies    Medication Adjustments/Labs and Tests Ordered: Current medicines are reviewed at length with the patient today.  Concerns regarding medicines are outlined above.  Medication changes, Labs and Tests ordered today are listed in the Patient Instructions below. Patient Instructions  Dr Sallyanne Kuster has recommended making the following medication changes: 1. STOP Metoprolol TARTRATE (Lopressor) 2. START Metoprolol SUCCINATE 100 mg - take 1 tablet by mouth once daily  Your physician recommends that you schedule a follow-up appointment in 12 months. You will receive a reminder letter in the mail two months in advance. If you don't receive a letter, please call our office to schedule the follow-up appointment.  If you need a refill on your cardiac medications before your next appointment, please call your pharmacy.    Signed, Sanda Klein, MD  11/27/2016 5:24 PM    [address] [facility] [address], [place], [place]  [address] Phone: [phone]; Fax: [phone]

## 2017-01-13 ENCOUNTER — Other Ambulatory Visit: Payer: Self-pay | Admitting: Family Medicine

## 2017-01-19 ENCOUNTER — Telehealth: Payer: Self-pay | Admitting: *Deleted

## 2017-01-19 NOTE — Telephone Encounter (Signed)
Bethany Cardenas calls to check the status of PCA forms.  They have not heard anything from insurance company. Rozalyn Osland, [name], CMA

## 2017-01-20 DIAGNOSIS — H25813 Combined forms of age-related cataract, bilateral: Secondary | ICD-10-CM | POA: Diagnosis not present

## 2017-01-20 NOTE — Telephone Encounter (Signed)
Filled out paperwork again. Placed in fax pile. Hopefully they will hear something within next couple weeks.   Please call family to let them know.  Thanks, Cherly Anderson. Rosalyn Gess, [place] [facility] Resident PGY-2 01/20/2017 4:11 PM

## 2017-01-27 NOTE — Telephone Encounter (Signed)
LVM to inform pt of below. Bethany Cardenas, [name], [name]

## 2017-01-29 ENCOUNTER — Other Ambulatory Visit: Payer: Self-pay | Admitting: Family Medicine

## 2017-02-05 ENCOUNTER — Other Ambulatory Visit: Payer: Self-pay | Admitting: Family Medicine

## 2017-02-09 ENCOUNTER — Other Ambulatory Visit: Payer: Self-pay | Admitting: Family Medicine

## 2017-02-09 ENCOUNTER — Telehealth: Payer: Self-pay | Admitting: Family Medicine

## 2017-02-09 MED ORDER — FUROSEMIDE 40 MG PO TABS: 40.0000 mg | ORAL_TABLET | Freq: Every day | ORAL | 12 refills | Status: AC

## 2017-02-09 NOTE — Telephone Encounter (Signed)
Form that was sent to [facility] needs current medical stability status.  Please correct and resent the form  (the form was faxed from here  01-15-17)  Please advise

## 2017-02-09 NOTE — Telephone Encounter (Signed)
Needs refill on furseomide.  [facility] [address]

## 2017-02-17 NOTE — Telephone Encounter (Signed)
There are no forms in my box to review as of 02/17/17 at 0944.  Will need to be sent to me again.  Daniel L. Rosalyn Gess, [place] Medicine Resident PGY-2 02/17/2017 9:44 AM

## 2017-02-20 NOTE — Telephone Encounter (Addendum)
[employer] and they said that the Medical Stability Question was not completed on the form and I asked them to resend it Attention: Dr. Rosalyn Gess.  Routing to PCP so he can keep an eye out for this and get it completed.  [name], Damier Disano D, [name]

## 2017-02-20 NOTE — Telephone Encounter (Signed)
LVM to have someone call office back to inform them that we contacted [place] and we are waiting for them to refax the forms so they can be completed. [name], Tahira Olivarez D, [name]

## 2017-03-02 DIAGNOSIS — H6123 Impacted cerumen, bilateral: Secondary | ICD-10-CM | POA: Diagnosis not present

## 2017-03-02 DIAGNOSIS — H9113 Presbycusis, bilateral: Secondary | ICD-10-CM | POA: Diagnosis not present

## 2017-03-03 ENCOUNTER — Other Ambulatory Visit: Payer: Self-pay | Admitting: Family Medicine

## 2017-03-10 ENCOUNTER — Other Ambulatory Visit: Payer: Self-pay

## 2017-03-10 DIAGNOSIS — H903 Sensorineural hearing loss, bilateral: Secondary | ICD-10-CM | POA: Diagnosis not present

## 2017-03-10 NOTE — Telephone Encounter (Signed)
Routing to doctor covering box to see if they have seen this paperwork. Bethany Cardenas, Bethany Cardenas, [name]

## 2017-03-10 NOTE — Telephone Encounter (Signed)
Patient daughter left message on nurse line requesting refill on Simvastatin. Danley Danker, RN [facility] RN)

## 2017-03-10 NOTE — Telephone Encounter (Signed)
Bethany Cardenas is calling about this again. They have not heard anything. Danley Danker, RN [facility] RN)

## 2017-03-13 MED ORDER — SIMVASTATIN 40 MG PO TABS: ORAL_TABLET | ORAL | 3 refills | Status: DC

## 2017-03-13 NOTE — Telephone Encounter (Signed)
I have been covering Dr. Lynne Logan box and I have not seen any paperwork on this.  Please have them send it again and give it to attention Dr. Erin Hearing.

## 2017-03-13 NOTE — Telephone Encounter (Signed)
Bethany Cardenas again and they are re-faxing the form again however it is now out side of the 90 days and the pt needs to have an office visit for them to accept the form.  If pt calls back please help get this scheduled so that we may get this completed.  Routing to Dr. Erin Cardenas to let him know that the form will come to him and the date of last visit will need to be updated and signed again. Bethany Cardenas, Bethany Cardenas, [name]

## 2017-03-16 ENCOUNTER — Other Ambulatory Visit: Payer: Self-pay | Admitting: Family Medicine

## 2017-03-17 NOTE — Telephone Encounter (Signed)
Pt has appointment for this on 03/24/17 to complete this paperwork. [name], April D, [name]

## 2017-03-18 NOTE — Telephone Encounter (Signed)
Form was sent to Dr. Erin Hearing and is attached as a reference for the new form that needs to be completed at visit with Dr. Criss Rosales on 03/24/17. Placed form in PCP box so he will have these at this visit.  Routing to PCP as an Pharmacist, hospital.  [name], April D

## 2017-03-18 NOTE — Telephone Encounter (Signed)
Spoke to [employer] at [place] and she is going to re-fax the form and also gave me the website to print the form if it doesn't come through. [name], Bethanny Toelle D, [name]

## 2017-03-24 ENCOUNTER — Ambulatory Visit (INDEPENDENT_AMBULATORY_CARE_PROVIDER_SITE_OTHER): Payer: Medicare Other | Admitting: Family Medicine

## 2017-03-24 ENCOUNTER — Encounter: Payer: Self-pay | Admitting: Family Medicine

## 2017-03-24 ENCOUNTER — Other Ambulatory Visit: Payer: Self-pay

## 2017-03-24 DIAGNOSIS — Z741 Need for assistance with personal care: Secondary | ICD-10-CM

## 2017-03-24 NOTE — Progress Notes (Signed)
    Subjective:  Bethany Cardenas is a 84 y.o. female who presents to the [facility] today with a chief complaint of getting paperwork filled out for home health services.   HPI: She presents with her grandniece who helps manage her affairs and the affairs of her sister who the patient lives with.   They are both getting less mobile and occasionally forget details and medications.  Based on this, her grandniece is trying help them fill out paperwork for home services.     She is able to ambulate but is very frail and appears unstable although she describes no falls.  Her BP was high today but she didn't take her furosemide because she didn't want to have to go to the bathroom.   The patient thinks she does largely ok and wants to live alone but she does admit forgetting things now and then and that she has trouble moving things around the house and doing chores.  Objective:  Physical Exam: BP (!) 176/62   Pulse 64   Temp 98.6 F (37 C) (Oral)   Wt 111 lb 9.6 oz (50.6 kg)   SpO2 (!) 76%   BMI 21.09 kg/m   Gen: NAD, frail CV: RRR with no murmurs appreciated Pulm: NWOB, CTAB with no crackles, wheezes, or rhonchi GI: Normal bowel sounds present. Soft, Nontender, Nondistended. MSK: no edema, cyanosis, or clubbing noted Skin: non-infected sebaceious cyst on left neck Neuro: grossly normal, moves all extremities Psych: oriented x3 but with evident dementia  No results found for this or any previous visit (from the past 72 hour(s)).   Assessment/Plan:  Need for assistance with personal care Assisted patient and grndneice with filling out medical evaluation forms for personal care assistance at home.   We did discuss the safety concerns about her not having assistance and that she should consider her next step as should could likely soon need assisted living.   Sherene Sires, [place] - PGY1 03/26/2017 2:33 PM

## 2017-03-24 NOTE — Patient Instructions (Signed)
It was a pleasure to see you today! Thank you for choosing [facility] for your primary care. Bethany Cardenas was seen for personal services paperwork. Come back to the clinic if you have any routine problems, and go to the emergency room if you have any life threatening symptoms.  Please let me know if there are any adjustments needed to the paperwork.    If we did any lab work today, and the results require attention, either me or my nurse will get in touch with you. If everything is normal, you will get a letter in mail and a message via . If you don't hear from [place] in two weeks, please give [place] a call. Otherwise, we look forward to seeing you again at your next visit. If you have any questions or concerns before then, please call the clinic at [phone].  Please bring all your medications to every doctors visit  Sign up for My Chart to have easy access to your labs results, and communication with your Primary care physician.    Please check-out at the front desk before leaving the clinic.    Best,  Dr. Sherene Sires FAMILY MEDICINE RESIDENT - PGY1 03/24/2017 4:43 PM

## 2017-03-26 DIAGNOSIS — Z741 Need for assistance with personal care: Secondary | ICD-10-CM | POA: Insufficient documentation

## 2017-03-26 NOTE — Assessment & Plan Note (Signed)
Assisted patient and grndneice with filling out medical evaluation forms for personal care assistance at home.   We did discuss the safety concerns about her not having assistance and that she should consider her next step as should could likely soon need assisted living.

## 2017-03-29 ENCOUNTER — Other Ambulatory Visit: Payer: Self-pay | Admitting: Family Medicine

## 2017-03-30 ENCOUNTER — Other Ambulatory Visit: Payer: Self-pay

## 2017-03-30 MED ORDER — PANTOPRAZOLE SODIUM 20 MG PO TBEC: 20.0000 mg | DELAYED_RELEASE_TABLET | Freq: Every day | ORAL | 1 refills | Status: DC

## 2017-04-27 ENCOUNTER — Other Ambulatory Visit: Payer: Self-pay

## 2017-04-27 DIAGNOSIS — J449 Chronic obstructive pulmonary disease, unspecified: Secondary | ICD-10-CM

## 2017-04-27 MED ORDER — ALBUTEROL SULFATE (2.5 MG/3ML) 0.083% IN NEBU: 2.5000 mg | INHALATION_SOLUTION | Freq: Four times a day (QID) | RESPIRATORY_TRACT | 1 refills | Status: DC | PRN

## 2017-08-03 ENCOUNTER — Other Ambulatory Visit: Payer: Self-pay | Admitting: Family Medicine

## 2017-08-03 DIAGNOSIS — J449 Chronic obstructive pulmonary disease, unspecified: Secondary | ICD-10-CM

## 2017-09-21 ENCOUNTER — Other Ambulatory Visit: Payer: Self-pay | Admitting: Cardiovascular Disease

## 2017-09-21 NOTE — Telephone Encounter (Signed)
Rx request sent to pharmacy.  

## 2017-09-28 ENCOUNTER — Other Ambulatory Visit: Payer: Self-pay | Admitting: Family Medicine

## 2017-11-10 ENCOUNTER — Ambulatory Visit (INDEPENDENT_AMBULATORY_CARE_PROVIDER_SITE_OTHER): Payer: Medicare Other | Admitting: Family Medicine

## 2017-11-10 ENCOUNTER — Other Ambulatory Visit: Payer: Self-pay

## 2017-11-10 ENCOUNTER — Encounter: Payer: Self-pay | Admitting: Family Medicine

## 2017-11-10 VITALS — BP 128/62 | HR 74 | Temp 98.7°F | Wt 103.0 lb

## 2017-11-10 DIAGNOSIS — J449 Chronic obstructive pulmonary disease, unspecified: Secondary | ICD-10-CM

## 2017-11-10 DIAGNOSIS — Z23 Encounter for immunization: Secondary | ICD-10-CM

## 2017-11-10 DIAGNOSIS — R7303 Prediabetes: Secondary | ICD-10-CM

## 2017-11-10 DIAGNOSIS — I10 Essential (primary) hypertension: Secondary | ICD-10-CM | POA: Diagnosis not present

## 2017-11-10 LAB — POCT GLYCOSYLATED HEMOGLOBIN (HGB A1C): HEMOGLOBIN A1C: 5.8 % — AB (ref 4.0–5.6)

## 2017-11-10 MED ORDER — UMECLIDINIUM-VILANTEROL 62.5-25 MCG/INH IN AEPB: 1.0000 | INHALATION_SPRAY | Freq: Every day | RESPIRATORY_TRACT | 2 refills | Status: AC

## 2017-11-10 MED ORDER — IPRATROPIUM-ALBUTEROL 0.5-2.5 (3) MG/3ML IN SOLN: RESPIRATORY_TRACT | 1 refills | Status: DC

## 2017-11-10 NOTE — Patient Instructions (Signed)
It was a pleasure to see you today! Thank you for choosing [facility] for your primary care. Bethany Cardenas was seen for medication discuss. Come back to the clinic if you have any routine concerns, and go to the emergency room if you have life threatening problems.  Today we refilled your meds, changed you inhaler to one that is easier to use and will check your kidney and diabetes labs.    We'll send you a message in mychart about the labs results   Please bring all your medications to every doctors visit   Sign up for My Chart to have easy access to your labs results, and communication with your Primary care physician.     Please check-out at the front desk before leaving the clinic.     Best,  Dr. Sherene Sires FAMILY MEDICINE RESIDENT - PGY2 11/10/2017 3:28 PM

## 2017-11-10 NOTE — Progress Notes (Signed)
    Subjective:  Bethany Cardenas is a 84 y.o. female who presents to the [facility] today with a chief complaint of flu shot and medication adjustments.   HPI: Medication adjustments: she does not have the dexterity to adequately manipulate on of her inhalers.  We discussed timing and she seems very capable with that, no new respiratory complaints.  DM patient consents to lab check for A1C, no diabetic symptoms  HTN: patient's daughter helps with meds, is well controlled today.  No chest pain/headaches/vision changes.   Objective:  Physical Exam: BP 128/62   Pulse 74   Temp 98.7 F (37.1 C) (Oral)   Wt 103 lb (46.7 kg)   SpO2 96%   BMI 19.46 kg/m   Gen: NAD, resting comfortably, frail but alert CV: RRR with no murmurs appreciated Pulm: NWOB, CTAB with no crackles, wheezes, or rhonchi GI: Normal bowel sounds present. Soft, Nontender, Nondistended. MSK: no edema, cyanosis, or clubbing noted Skin: warm, dry Neuro: grossly normal, moves all extremities Psych: Normal affect and thought content  Results for orders placed or performed in visit on 11/10/17 (from the past 72 hour(s))  Basic Metabolic Panel     Status: Abnormal   Collection Time: 11/10/17  3:33 PM  Result Value Ref Range   Glucose 109 (H) 65 - 99 mg/dL   BUN 12 10 - 36 mg/dL   Creatinine, Ser 0.79 0.57 - 1.00 mg/dL   GFR calc non Af Amer 65 >59 mL/min/1.73   GFR calc Af Amer 75 >59 mL/min/1.73   BUN/Creatinine Ratio 15 12 - 28   Sodium 141 134 - 144 mmol/L   Potassium 4.0 3.5 - 5.2 mmol/L   Chloride 101 96 - 106 mmol/L   CO2 25 20 - 29 mmol/L   Calcium 9.4 8.7 - 10.3 mg/dL  HgB A1c     Status: Abnormal   Collection Time: 11/10/17  3:35 PM  Result Value Ref Range   Hemoglobin A1C 5.8 (A) 4.0 - 5.6 %   HbA1c POC (<> result, manual entry)     HbA1c, POC (prediabetic range)     HbA1c, POC (controlled diabetic range)       Assessment/Plan:  COPD (chronic obstructive pulmonary disease) Changed inhaler to anora  eleipta because she can't manipulate the other inhaler well  HYPERTENSION, BENIGN SYSTEMIC Well controlled, will order BMP to check levels  Need for immunization against influenza Ordered flu shot  Prediabetes A1c 5.8, prediabetes.  No plan to start medication in this [place], [place] - PGY2 11/12/2017 2:08 PM

## 2017-11-11 ENCOUNTER — Telehealth: Payer: Self-pay | Admitting: *Deleted

## 2017-11-11 DIAGNOSIS — J449 Chronic obstructive pulmonary disease, unspecified: Secondary | ICD-10-CM

## 2017-11-11 LAB — BASIC METABOLIC PANEL
BUN/Creatinine Ratio: 15 (ref 12–28)
BUN: 12 mg/dL (ref 10–36)
CALCIUM: 9.4 mg/dL (ref 8.7–10.3)
CO2: 25 mmol/L (ref 20–29)
Chloride: 101 mmol/L (ref 96–106)
Creatinine, Ser: 0.79 mg/dL (ref 0.57–1.00)
GFR, EST AFRICAN AMERICAN: 75 mL/min/{1.73_m2} (ref >59)
GFR, EST NON AFRICAN AMERICAN: 65 mL/min/{1.73_m2} (ref >59)
Glucose: 109 mg/dL — ABNORMAL HIGH (ref 65–99)
Potassium: 4 mmol/L (ref 3.5–5.2)
SODIUM: 141 mmol/L (ref 134–144)

## 2017-11-11 MED ORDER — ALBUTEROL SULFATE (2.5 MG/3ML) 0.083% IN NEBU: INHALATION_SOLUTION | RESPIRATORY_TRACT | 1 refills | Status: DC

## 2017-11-11 NOTE — Telephone Encounter (Signed)
Pharmacy needed a script resent with Dx code so insurance will pay.  Will resend now. Edilia Ghuman, [name], CMA

## 2017-11-12 DIAGNOSIS — Z23 Encounter for immunization: Secondary | ICD-10-CM | POA: Insufficient documentation

## 2017-11-12 DIAGNOSIS — R7303 Prediabetes: Secondary | ICD-10-CM | POA: Insufficient documentation

## 2017-11-12 NOTE — Assessment & Plan Note (Signed)
Ordered flu shot

## 2017-11-12 NOTE — Assessment & Plan Note (Signed)
A1c 5.8, prediabetes.  No plan to start medication in this 84yo

## 2017-11-12 NOTE — Assessment & Plan Note (Signed)
Well controlled, will order BMP to check levels

## 2017-11-12 NOTE — Assessment & Plan Note (Signed)
Changed inhaler to anora eleipta because she can't manipulate the other inhaler well

## 2017-11-17 ENCOUNTER — Other Ambulatory Visit: Payer: Self-pay | Admitting: Family Medicine

## 2017-11-17 DIAGNOSIS — J449 Chronic obstructive pulmonary disease, unspecified: Secondary | ICD-10-CM

## 2017-11-26 ENCOUNTER — Ambulatory Visit (INDEPENDENT_AMBULATORY_CARE_PROVIDER_SITE_OTHER): Payer: Medicare Other | Admitting: Cardiovascular Disease

## 2017-11-26 ENCOUNTER — Encounter: Payer: Self-pay | Admitting: Cardiovascular Disease

## 2017-11-26 VITALS — BP 130/70 | HR 85 | Ht 61.0 in | Wt 97.2 lb

## 2017-11-26 DIAGNOSIS — I25118 Atherosclerotic heart disease of native coronary artery with other forms of angina pectoris: Secondary | ICD-10-CM

## 2017-11-26 DIAGNOSIS — R636 Underweight: Secondary | ICD-10-CM | POA: Diagnosis not present

## 2017-11-26 DIAGNOSIS — I739 Peripheral vascular disease, unspecified: Secondary | ICD-10-CM

## 2017-11-26 DIAGNOSIS — E7800 Pure hypercholesterolemia, unspecified: Secondary | ICD-10-CM | POA: Diagnosis not present

## 2017-11-26 DIAGNOSIS — I6522 Occlusion and stenosis of left carotid artery: Secondary | ICD-10-CM | POA: Diagnosis not present

## 2017-11-26 DIAGNOSIS — I5032 Chronic diastolic (congestive) heart failure: Secondary | ICD-10-CM | POA: Diagnosis not present

## 2017-11-26 DIAGNOSIS — I4821 Permanent atrial fibrillation: Secondary | ICD-10-CM

## 2017-11-26 DIAGNOSIS — I10 Essential (primary) hypertension: Secondary | ICD-10-CM | POA: Diagnosis not present

## 2017-11-26 NOTE — Patient Instructions (Signed)
Medication Instructions:  Dr Croitoru recommends that you continue on your current medications as directed. Please refer to the Current Medication list given to you today.  If you need a refill on your cardiac medications before your next appointment, please call your pharmacy.   Follow-Up: At [facility], you and your health needs are our priority.  As part of our continuing mission to provide you with exceptional heart care, we have created designated Provider Care Teams.  These Care Teams include your primary Cardiologist (physician) and Advanced Practice Providers (APPs -  Physician Assistants and Nurse Practitioners) who all work together to provide you with the care you need, when you need it. You will need a follow up appointment in 12 months.  Please call our office 2 months in advance to schedule this appointment.  You may see Mihai Croitoru, MD or one of the following Advanced Practice Providers on your designated Care Team: Hao Meng, PA-C . Angela Duke, PA-C 

## 2017-11-26 NOTE — Progress Notes (Signed)
Cardiology Office Note    Date:  11/27/2017   ID:  Bethany Cardenas, DOB 1922-03-05, MRN [id number]  PCP:  Sherene Sires, DO  Cardiologist:   Sanda Klein, MD   chief complaint:follow-up for CAD, CHF, atrial fibrillation   History of Present Illness:  Bethany Cardenas is a 84 y.o. female with long-standing permanent atrial fibrillation, CAD, PAD, diastolic CHF, hypertrophic cardiomyopathy, hyperlipidemia, HTN, here for follow-up.  For the most part she is doing well, although her family reports that she becomes more easily winded.  She is quite sedentary.  Her intake of food has decreased and she is losing weight.  She is now under 100 pounds and is officially underweight with a BMI of only 18.  She does not complain much and she has not required hospitalization or adjustment in her dose of diuretics.  She probably receives it for 5 days a week.  In fact, she sometimes refuses to take her diuretics to avoid urinating so much.  She remains very unsteady in her gait but has not had serious falls or injuries.  Cardiac catheterization in 2008 showed 80% stenosis of the left main coronary artery, total occlusion of the LAD artery following the first septal and first diagonal branches with reconstitution of the distal LAD via collaterals, total occlusion of the left circumflex coronary artery from the first oblique marginal artery, and total occlusion of the right coronary artery following the RV marginal branch, again with reconstitution of the distal vessel via collaterals. She was evaluated by cardiovascular surgery at that time and felt to be a poor candidate for bypass. She has done remarkably well on medical therapy.  Her carotid ultrasound study performed in 2013 showed a 50-69% obstruction in the left proximal internal carotid artery and a less than 50% stenosis on the right side, and in 2014 the study was described as showing "no significant ICA stenosis". In January 2015 she was admitted  with transient acute dysphagia and dysphonia, but imaging studies did not show evidence of recent stroke. MRA showed a remote right basal ganglia infarct with occlusion of the M1 segment of the right middle cerebral artery.   In 2007, lower extremity arterial duplex ultrasound showed severely reduced arterial flow to both feet. The right ABI was 0.42, left ABI was 0.39. The right proximal SFA was occluded with distal reconstitution. The right peroneal artery was occluded. Right posterior tibial artery was also occluded but reconstituted in the mid to distal segment. On the left side the arterial system was occluded downstream of the left mid SFA.  She is in permanent atrial fibrillation for at least several years.   She has systemic hypertension and evidence of left ventricular hypertrophy. The angiographic appearance during left ventriculography suggested apical variant hypertrophic cardiomyopathy, but there also may be a component of hypertensive heart disease. By echocardiography and ventriculography she had preserved left ventricular systolic function in the past, with EF of 55-60% and concentric LVH by echo in January 2015. She bears a diagnosis of congestive heart failure but has not required hospitalization or any adjustment in her diuretic therapy in a very long time.  Past Medical History:  Diagnosis Date  . Atherosclerotic cardiovascular disease   . Atrial fibrillation ([place])   . Carotid disease, bilateral ([place])   . CVA 03/12/2006   Qualifier: Diagnosis of  By: Eusebio Friendly    . Dyslipidemia   . Hypertrophic cardiomyopathy ([place])   . PVD (peripheral vascular disease) ([place])   . Stroke (  [place])   . Systemic hypertension   . TOBACCO DEPENDENCE 03/12/2006   Qualifier: Diagnosis of  By: Eusebio Friendly      Past Surgical History:  Procedure Laterality Date  . CARDIAC CATHETERIZATION  01/27/2006   total occlusion of the mid RCA,80% stenosis of the distal left main, total occlusion of  the mid LAD following the first septal & first diagonal arteries,total occlusion of the left CX followint the first oblique marginal artery, reconstitution of the distal LAD and distal RCA via collaterals  . NM MYOCAR PERF WALL MOTION  11/25/2005   intermediate risk - mild inferolateral ischemia towards the apex extending to mid LV.  Marland Kitchen VEIN SURGERY  1999 & 2000    Current Medications: Outpatient Medications Prior to Visit  Medication Sig Dispense Refill  . albuterol (PROVENTIL) (2.5 MG/3ML) 0.083% nebulizer solution USE 1 VAL IN NEBULIZER EVERY 6 HOURS AS NEEDED FOR WHEEZING OR SHORTNESS OF BREATH Dx code: J44.9 150 mL 1  . aspirin (ADULT ASPIRIN EC LOW STRENGTH) 81 MG EC tablet Take 81 mg by mouth daily.     . cetirizine (ZYRTEC) 5 MG tablet TAKE 1 TABLET (5 MG TOTAL) BY MOUTH AT BEDTIME. 90 tablet 0  . ergocalciferol (VITAMIN D2) 50000 UNITS capsule Take 50,000 Units by mouth once a week. Unknown day of the week    . furosemide (LASIX) 40 MG tablet Take 1 tablet (40 mg total) by mouth daily. 30 tablet 12  . metoprolol succinate (TOPROL-XL) 100 MG 24 hr tablet TAKE 1 TABLET (100 MG TOTAL) DAILY BY MOUTH. TAKE WITH OR IMMEDIATELY FOLLOWING A MEAL. 90 tablet 1  . simvastatin (ZOCOR) 40 MG tablet TAKE ONE-HALF TABLET BY MOUTH EVERY NIGHT AT BEDTIME 45 tablet 2  . umeclidinium-vilanterol (ANORO ELLIPTA) 62.5-25 MCG/INH AEPB Inhale 1 puff into the lungs daily. 1 each 2  . pantoprazole (PROTONIX) 20 MG tablet Take 1 tablet (20 mg total) by mouth daily. 90 tablet 1  . albuterol (PROVENTIL) (2.5 MG/3ML) 0.083% nebulizer solution USE 1 VAL IN NEBULIZER EVERY 6 HOURS AS NEEDED FOR WHEEZING OR SHORTNESS OF BREATH 150 mL 1  . ipratropium-albuterol (DUONEB) 0.5-2.5 (3) MG/3ML SOLN TAKE 1 VIAL BY NEBULIZATION EVERY 6 (SIX) HOURS AS NEEDED. 360 mL 1   No facility-administered medications prior to visit.      Allergies:   Patient has no known allergies.   Social History   Socioeconomic History  .  Marital status: Widowed    Spouse name: Not on file  . Number of children: Not on file  . Years of education: Not on file  . Highest education level: Not on file  Occupational History  . Not on file  Social Needs  . Financial resource strain: Not on file  . Food insecurity:    Worry: Not on file    Inability: Not on file  . Transportation needs:    Medical: Not on file    Non-medical: Not on file  Tobacco Use  . Smoking status: Former Smoker    Packs/day: 0.50    Years: 50.00    Pack years: 25.00    Types: Cigarettes    Start date: 01/13/1949    Last attempt to quit: 01/14/1999    Years since quitting: 18.8  . Smokeless tobacco: Never Used  Substance and Sexual Activity  . Alcohol use: No    Alcohol/week: 0.0 standard drinks  . Drug use: No  . Sexual activity: Never  Lifestyle  . Physical activity:  Days per week: Not on file    Minutes per session: Not on file  . Stress: Not on file  Relationships  . Social connections:    Talks on phone: Not on file    Gets together: Not on file    Attends religious service: Not on file    Active member of club or organization: Not on file    Attends meetings of clubs or organizations: Not on file    Relationship status: Not on file  Other Topics Concern  . Not on file  Social History Narrative  . Not on file     Family History:  The patient's family history includes Cancer in her sister; Diabetes in her father; Hypertension in her sister.   ROS:   Please see the history of present illness.    ROS all other systems are reviewed and are negative   PHYSICAL EXAM:   VS:  BP 130/70   Pulse 85   Ht 5\' 1"  (1.549 m)   Wt 97 lb 3.2 oz (44.1 kg)   BMI 18.37 kg/m       General: Alert, oriented x3, no distress, appears very elderly and frail Head: no evidence of trauma, PERRL, EOMI, no exophtalmos or lid lag, no myxedema, no xanthelasma; normal ears, nose and oropharynx Neck: normal jugular venous pulsations and no  hepatojugular reflux; brisk carotid pulses without delay and no carotid bruits Chest: There are some crackles in the mid to posterior right chest but do not completely clear with cough, otherwise no signs of consolidation by percussion or palpation, normal fremitus, symmetrical and full respiratory excursions Cardiovascular: normal position and quality of the apical impulse, irregular rhythm, normal first and second heart sounds, no murmurs, rubs or gallops Abdomen: no tenderness or distention, no masses by palpation, no abnormal pulsatility or arterial bruits, normal bowel sounds, no hepatosplenomegaly Extremities: no clubbing, cyanosis or edema; 2+ radial, ulnar and brachial pulses bilaterally; 2+ right femoral, posterior tibial and dorsalis pedis pulses; 2+ left femoral, posterior tibial and dorsalis pedis pulses; no subclavian or femoral bruits Neurological: grossly nonfocal Psych: Normal mood and affect   Wt Readings from Last 3 Encounters:  11/26/17 97 lb 3.2 oz (44.1 kg)  11/10/17 103 lb (46.7 kg)  03/24/17 111 lb 9.6 oz (50.6 kg)      Studies/Labs Reviewed:   EKG:  EKG is ordered today.  The ekg ordered today demonstrates atrial fibrillation with controlled rate, left axis deviation and poor R wave progression  Recent Labs: 11/10/2017: BUN 12; Creatinine, Ser 0.79; Potassium 4.0; Sodium 141   Lipid Panel    Component Value Date/Time   CHOL 147 11/13/2014 1106   TRIG 58 11/13/2014 1106   HDL 66 11/13/2014 1106   CHOLHDL 2.2 11/13/2014 1106   VLDL 12 11/13/2014 1106   LDLCALC 69 11/13/2014 1106     ASSESSMENT:    1. Permanent atrial fibrillation   2. Coronary artery disease of native artery of native heart with stable angina pectoris ([place])   3. Essential hypertension   4. PAD (peripheral artery disease) ([place])   5. Hypercholesterolemia   6. Chronic diastolic heart failure (HCC)   7. Stenosis of left carotid artery   8. Underweight      PLAN:  In order of problems  listed above:  1. AFib: Well rate controlled. Asymptomatic. Despite very high embolic risk (CHADSVasc 7-8 - age 52, CVA 2, gender, PAD/CAD, HTN =/- CHF) she is also at high bleeding risk and we  are treating with aspirin only.  2. CAD: She does not have angina pectoris, but her HF is at least partly caused by coronary insufficiency.  She has done remarkably well with medical therapy considering that she has chronic total occlusion of all 3 major coronary arteries with collateral filling of distal vessels.  When last checked, left ventricular systolic function was still normal.  Continue treatment with aspirin, beta-blocker and statin.   3. HTN: Well-controlled 4. PAD: No clinical evidence of threatened limb ischemia and no claudication (of course she is very sedentary 5. HLP: On statin 6. CHF: By clinical exam she appears to be euvolemic.  She denies dyspnea.  Again, she is very sedentary so it is hard to establish functional status. 7. L Carotid stenosis: No recent stroke/TIA.  Poor surgical candidate.  8. Underweight: Substantial weight loss, about 15% of her weight in the last year, now with a BMI of 9018.  Discussed ways to supplement with high calorie/high protein nutrients.    Medication Adjustments/Labs and Tests Ordered: Current medicines are reviewed at length with the patient today.  Concerns regarding medicines are outlined above.  Medication changes, Labs and Tests ordered today are listed in the Patient Instructions below. Patient Instructions  Medication Instructions:  Dr Sallyanne Kuster recommends that you continue on your current medications as directed. Please refer to the Current Medication list given to you today.  If you need a refill on your cardiac medications before your next appointment, please call your pharmacy.   Follow-Up: At [facility], you and your health needs are our priority.  As part of our continuing mission to provide you with exceptional heart care, we have  created designated Provider Care Teams.  These Care Teams include your primary Cardiologist (physician) and Advanced Practice Providers (APPs -  Physician Assistants and Nurse Practitioners) who all work together to provide you with the care you need, when you need it. You will need a follow up appointment in 12 months.  Please call our office 2 months in advance to schedule this appointment.  You may see Sanda Klein, MD or one of the following Advanced Practice Providers on your designated Care Team: [place], [place] . Fabian Sharp, PA-C    Signed, Sanda Klein, MD  11/27/2017 4:04 PM    [facility] [address], [place], [place]  [address] Phone: [phone]; Fax: [phone]

## 2017-12-07 ENCOUNTER — Other Ambulatory Visit: Payer: Self-pay | Admitting: Family Medicine

## 2018-03-17 ENCOUNTER — Other Ambulatory Visit: Payer: Self-pay | Admitting: Family Medicine

## 2018-03-17 DIAGNOSIS — J449 Chronic obstructive pulmonary disease, unspecified: Secondary | ICD-10-CM

## 2018-05-15 ENCOUNTER — Other Ambulatory Visit: Payer: Self-pay | Admitting: Family Medicine

## 2018-05-15 DIAGNOSIS — J449 Chronic obstructive pulmonary disease, unspecified: Secondary | ICD-10-CM

## 2018-06-08 ENCOUNTER — Other Ambulatory Visit: Payer: Self-pay | Admitting: Family Medicine

## 2018-06-21 DIAGNOSIS — M21612 Bunion of left foot: Secondary | ICD-10-CM | POA: Diagnosis not present

## 2018-06-21 DIAGNOSIS — M2041 Other hammer toe(s) (acquired), right foot: Secondary | ICD-10-CM | POA: Diagnosis not present

## 2018-06-21 DIAGNOSIS — M2042 Other hammer toe(s) (acquired), left foot: Secondary | ICD-10-CM | POA: Diagnosis not present

## 2018-06-21 DIAGNOSIS — M21611 Bunion of right foot: Secondary | ICD-10-CM | POA: Diagnosis not present

## 2018-07-02 DIAGNOSIS — M21611 Bunion of right foot: Secondary | ICD-10-CM | POA: Diagnosis not present

## 2018-07-02 DIAGNOSIS — M21612 Bunion of left foot: Secondary | ICD-10-CM | POA: Diagnosis not present

## 2018-07-02 DIAGNOSIS — I739 Peripheral vascular disease, unspecified: Secondary | ICD-10-CM | POA: Diagnosis not present

## 2018-07-02 DIAGNOSIS — M79671 Pain in right foot: Secondary | ICD-10-CM | POA: Diagnosis not present

## 2018-07-03 ENCOUNTER — Encounter (HOSPITAL_COMMUNITY): Payer: Self-pay | Admitting: Emergency Medicine

## 2018-07-03 ENCOUNTER — Other Ambulatory Visit: Payer: Self-pay

## 2018-07-03 ENCOUNTER — Inpatient Hospital Stay (HOSPITAL_COMMUNITY)
Admission: EM | Admit: 2018-07-03 | Discharge: 2018-07-14 | DRG: 394 | Disposition: E | Payer: Medicare Other | Attending: Family Medicine | Admitting: Family Medicine

## 2018-07-03 ENCOUNTER — Emergency Department (HOSPITAL_COMMUNITY): Payer: Medicare Other

## 2018-07-03 DIAGNOSIS — R110 Nausea: Secondary | ICD-10-CM | POA: Diagnosis not present

## 2018-07-03 DIAGNOSIS — I110 Hypertensive heart disease with heart failure: Secondary | ICD-10-CM | POA: Diagnosis present

## 2018-07-03 DIAGNOSIS — I745 Embolism and thrombosis of iliac artery: Secondary | ICD-10-CM | POA: Diagnosis present

## 2018-07-03 DIAGNOSIS — Z87891 Personal history of nicotine dependence: Secondary | ICD-10-CM

## 2018-07-03 DIAGNOSIS — Z7982 Long term (current) use of aspirin: Secondary | ICD-10-CM

## 2018-07-03 DIAGNOSIS — J449 Chronic obstructive pulmonary disease, unspecified: Secondary | ICD-10-CM | POA: Diagnosis present

## 2018-07-03 DIAGNOSIS — Z515 Encounter for palliative care: Secondary | ICD-10-CM | POA: Diagnosis not present

## 2018-07-03 DIAGNOSIS — Z7189 Other specified counseling: Secondary | ICD-10-CM

## 2018-07-03 DIAGNOSIS — E785 Hyperlipidemia, unspecified: Secondary | ICD-10-CM | POA: Diagnosis present

## 2018-07-03 DIAGNOSIS — Z8249 Family history of ischemic heart disease and other diseases of the circulatory system: Secondary | ICD-10-CM

## 2018-07-03 DIAGNOSIS — Z1159 Encounter for screening for other viral diseases: Secondary | ICD-10-CM

## 2018-07-03 DIAGNOSIS — Z681 Body mass index (BMI) 19 or less, adult: Secondary | ICD-10-CM

## 2018-07-03 DIAGNOSIS — Z66 Do not resuscitate: Secondary | ICD-10-CM | POA: Diagnosis present

## 2018-07-03 DIAGNOSIS — I5032 Chronic diastolic (congestive) heart failure: Secondary | ICD-10-CM | POA: Diagnosis present

## 2018-07-03 DIAGNOSIS — Z20828 Contact with and (suspected) exposure to other viral communicable diseases: Secondary | ICD-10-CM | POA: Diagnosis not present

## 2018-07-03 DIAGNOSIS — Z79899 Other long term (current) drug therapy: Secondary | ICD-10-CM

## 2018-07-03 DIAGNOSIS — I10 Essential (primary) hypertension: Secondary | ICD-10-CM

## 2018-07-03 DIAGNOSIS — R64 Cachexia: Secondary | ICD-10-CM | POA: Diagnosis present

## 2018-07-03 DIAGNOSIS — I4821 Permanent atrial fibrillation: Secondary | ICD-10-CM | POA: Diagnosis not present

## 2018-07-03 DIAGNOSIS — R109 Unspecified abdominal pain: Secondary | ICD-10-CM | POA: Diagnosis not present

## 2018-07-03 DIAGNOSIS — R1111 Vomiting without nausea: Secondary | ICD-10-CM | POA: Diagnosis not present

## 2018-07-03 DIAGNOSIS — I422 Other hypertrophic cardiomyopathy: Secondary | ICD-10-CM | POA: Diagnosis present

## 2018-07-03 DIAGNOSIS — I743 Embolism and thrombosis of arteries of the lower extremities: Secondary | ICD-10-CM | POA: Diagnosis present

## 2018-07-03 DIAGNOSIS — K55059 Acute (reversible) ischemia of intestine, part and extent unspecified: Secondary | ICD-10-CM | POA: Diagnosis not present

## 2018-07-03 DIAGNOSIS — Z8673 Personal history of transient ischemic attack (TIA), and cerebral infarction without residual deficits: Secondary | ICD-10-CM

## 2018-07-03 DIAGNOSIS — K559 Vascular disorder of intestine, unspecified: Secondary | ICD-10-CM

## 2018-07-03 DIAGNOSIS — K551 Chronic vascular disorders of intestine: Secondary | ICD-10-CM | POA: Diagnosis not present

## 2018-07-03 DIAGNOSIS — I2510 Atherosclerotic heart disease of native coronary artery without angina pectoris: Secondary | ICD-10-CM | POA: Diagnosis present

## 2018-07-03 DIAGNOSIS — J309 Allergic rhinitis, unspecified: Secondary | ICD-10-CM | POA: Diagnosis present

## 2018-07-03 DIAGNOSIS — R1084 Generalized abdominal pain: Secondary | ICD-10-CM | POA: Diagnosis not present

## 2018-07-03 DIAGNOSIS — E162 Hypoglycemia, unspecified: Secondary | ICD-10-CM | POA: Diagnosis not present

## 2018-07-03 DIAGNOSIS — I2582 Chronic total occlusion of coronary artery: Secondary | ICD-10-CM | POA: Diagnosis present

## 2018-07-03 DIAGNOSIS — Z833 Family history of diabetes mellitus: Secondary | ICD-10-CM

## 2018-07-03 DIAGNOSIS — F0390 Unspecified dementia without behavioral disturbance: Secondary | ICD-10-CM | POA: Diagnosis present

## 2018-07-03 LAB — CBC WITH DIFFERENTIAL/PLATELET
Abs Immature Granulocytes: 0.01 10*3/uL (ref 0.00–0.07)
Basophils Absolute: 0 10*3/uL (ref 0.0–0.1)
Basophils Relative: 1 %
Eosinophils Absolute: 0.1 10*3/uL (ref 0.0–0.5)
Eosinophils Relative: 2 %
HCT: 40.6 % (ref 36.0–46.0)
Hemoglobin: 12.3 g/dL (ref 12.0–15.0)
Immature Granulocytes: 0 %
Lymphocytes Relative: 21 %
Lymphs Abs: 0.8 10*3/uL (ref 0.7–4.0)
MCH: 29.7 pg (ref 26.0–34.0)
MCHC: 30.3 g/dL (ref 30.0–36.0)
MCV: 98.1 fL (ref 80.0–100.0)
Monocytes Absolute: 0.4 10*3/uL (ref 0.1–1.0)
Monocytes Relative: 11 %
Neutro Abs: 2.5 10*3/uL (ref 1.7–7.7)
Neutrophils Relative %: 65 %
Platelets: 133 10*3/uL — ABNORMAL LOW (ref 150–400)
RBC: 4.14 MIL/uL (ref 3.87–5.11)
RDW: 14.2 % (ref 11.5–15.5)
WBC: 3.8 10*3/uL — ABNORMAL LOW (ref 4.0–10.5)
nRBC: 0 % (ref 0.0–0.2)

## 2018-07-03 LAB — PROTIME-INR
INR: 1.5 — ABNORMAL HIGH (ref 0.8–1.2)
Prothrombin Time: 18.3 seconds — ABNORMAL HIGH (ref 11.4–15.2)

## 2018-07-03 LAB — COMPREHENSIVE METABOLIC PANEL
ALT: 16 U/L (ref 0–44)
AST: 28 U/L (ref 15–41)
Albumin: 3.6 g/dL (ref 3.5–5.0)
Alkaline Phosphatase: 72 U/L (ref 38–126)
Anion gap: 12 (ref 5–15)
BUN: 13 mg/dL (ref 8–23)
CO2: 23 mmol/L (ref 22–32)
Calcium: 8.9 mg/dL (ref 8.9–10.3)
Chloride: 108 mmol/L (ref 98–111)
Creatinine, Ser: 0.92 mg/dL (ref 0.44–1.00)
GFR calc Af Amer: >60 mL/min (ref >60)
GFR calc non Af Amer: 54 mL/min — ABNORMAL LOW (ref >60)
Glucose, Bld: 122 mg/dL — ABNORMAL HIGH (ref 70–99)
Potassium: 4.1 mmol/L (ref 3.5–5.1)
Sodium: 143 mmol/L (ref 135–145)
Total Bilirubin: 0.6 mg/dL (ref 0.3–1.2)
Total Protein: 7.8 g/dL (ref 6.5–8.1)

## 2018-07-03 LAB — LACTIC ACID, PLASMA: Lactic Acid, Venous: 3.3 mmol/L (ref 0.5–1.9)

## 2018-07-03 LAB — LIPASE, BLOOD: Lipase: 37 U/L (ref 11–51)

## 2018-07-03 LAB — TROPONIN I: Troponin I: <0.03 ng/mL (ref <0.03)

## 2018-07-03 MED ORDER — PIPERACILLIN-TAZOBACTAM 3.375 G IVPB
3.3750 g | Freq: Three times a day (TID) | INTRAVENOUS | Status: DC
  Administered 2018-07-04: 3.375 g via INTRAVENOUS
  Filled 2018-07-03: qty 50

## 2018-07-03 MED ORDER — SODIUM CHLORIDE 0.9 % IV BOLUS
500.0000 mL | Freq: Once | INTRAVENOUS | Status: AC
  Administered 2018-07-03: 500 mL via INTRAVENOUS

## 2018-07-03 MED ORDER — HEPARIN (PORCINE) 25000 UT/250ML-% IV SOLN
650.0000 [IU]/h | INTRAVENOUS | Status: DC
  Administered 2018-07-03: 650 [IU]/h via INTRAVENOUS
  Filled 2018-07-03: qty 250

## 2018-07-03 MED ORDER — PIPERACILLIN-TAZOBACTAM 3.375 G IVPB 30 MIN
3.3750 g | Freq: Once | INTRAVENOUS | Status: AC
  Administered 2018-07-03: 3.375 g via INTRAVENOUS
  Filled 2018-07-03: qty 50

## 2018-07-03 MED ORDER — FENTANYL CITRATE (PF) 100 MCG/2ML IJ SOLN
25.0000 ug | INTRAMUSCULAR | Status: DC | PRN
  Administered 2018-07-03 – 2018-07-04 (×2): 25 ug via INTRAVENOUS
  Filled 2018-07-03 (×2): qty 2

## 2018-07-03 MED ORDER — HEPARIN BOLUS VIA INFUSION
2000.0000 [IU] | Freq: Once | INTRAVENOUS | Status: AC
  Administered 2018-07-03: 2000 [IU] via INTRAVENOUS
  Filled 2018-07-03: qty 2000

## 2018-07-03 MED ORDER — IOHEXOL 350 MG/ML SOLN
100.0000 mL | Freq: Once | INTRAVENOUS | Status: AC | PRN
  Administered 2018-07-03: 100 mL via INTRAVENOUS

## 2018-07-03 NOTE — ED Notes (Signed)
[address] lives with the pt, call for update or any questions.

## 2018-07-03 NOTE — H&P (Addendum)
[facility] Admission History and Physical Service Pager: [phone]  Patient name: Bethany Cardenas Medical record number: [id number] Date of birth: 1922-04-29 Age: 85 y.o. Gender: female  Primary Care Provider: Sherene Sires, DO Consultants: General surgery, vascular surgery Code Status: Full  Chief Complaint: Abdominal pain  Assessment and Plan: Bethany Cardenas is a 85 y.o. female presenting with abdominal pain, concerning for small bowel ischemia. PMH is significant for permanent atrial fibrillation not on anticoagulation, HFpEF, HLD, CAD, PAD, COPD, HTN, HLD, and dementia.  Abdominal pain: Likely due to small bowel ischemia.  Acute onset on 6/20 after having a bowel movement.  CTA abdomen/pelvis with and without contrast significant for complete occlusion of the SMA for 5 cm with faint distal reconstitution, occlusion of the celiac artery for 5 mm, no flow visualized in the IMA, and small bowel changes indicative of likely ischemia.  Lactic acid elevated to 3.3.  General surgery and vascular surgery were consulted in the ED, and general surgery assessed patient by the time this note was written.  They noted that CT was difficult to interpret, but she is not a surgical candidate given her age, frailty, and comorbidities.  Their recommendations include treating with heparin drip, Zosyn, and IV fluids rather than taking the patient to surgery.  They discussed this at length with patient's caregiver and power of attorney, which is her niece, Bethany Cardenas.  Patient is currently full code according to her niece, but her niece is amenable to discussion with palliative care to review goals of care.  She has been counseled that Bethany Cardenas's prognosis is poor if she has extensive small bowel ischemia. -Admit to MedSurg, attending Dr. Gwendlyn Deutscher -Appreciate general surgery and vascular surgery recommendations -Fentanyl 25 mcg every hour as needed for pain -Normal saline 75  mL/h -N.p.o. for bowel rest -Continue Zosyn, dosed per pharmacy -Continue heparin GTT, dosed per pharmacy -Trend lactic acid every 3 hours x 3 -Call to update patient's niece, Bethany Cardenas, in the morning -Palliative care consult for goals of care discussion  Chronic atrial fibrillation: Due to high risk of bleeding, patient is only on aspirin.  Takes metoprolol for rate control at home.  EKG shows atrial fibrillation with normal rate.   -Hold p.o. medications in the setting of bowel rest -No cardiac monitoring due to patient's dementia and stable atrial fibrillation  COPD: Patient takes Anoro Ellipta and albuterol at home.  She is not short of breath on my exam. -Continue Anoro Ellipta, can add albuterol if needed  HFpEF: Echo in January 2015 showed EF of 55 to 60% and concentric LVH.  CTA abdomen/pelvis shows evidence of right-sided heart failure as well.  Takes Lasix 40 mg p.o. most days.  Is not volume overloaded on exam. -Monitor fluid status and respiratory status, can add IV Lasix if needed  PAD/CAD/CVA/HLD: Patient has a significant history of vascular disease, with MRA in January 2015 showing a remote right basal ganglia infarct, ABIs in 2007 showing significant reduction, and severely reduced arterial flow with several occluded arteries in the lower extremities.  Heart catheterization in 2008 showed occlusion of several coronary arteries.  This is all been managed with medical therapy since she has not been a good surgical candidate. -Hold home simvastatin while n.p.o. -Goals of care discussion with palliative care  Dementia: Patient is oriented only to self at baseline and needs assistance with ADLs.  She lives with her niece. -Redirect if she displays signs of delirium -Minimize stimuli that  could cause confusion or discomfort -Goals of care discussion with palliative care  FEN/GI: NPO, NS 75 ml/hr Prophylaxis: heparin gtt  Disposition: inpatient, med-surg  HPI was  obtained from chart review.  History of Present Illness:  Bethany Cardenas is a 85 y.o. female presenting with domino pain concerning for small bowel ischemia.  This evening, 6/20, Bethany Cardenas called out in extreme pain after having a bowel movement.  She currently vomited 2 or 3 times and continued to have significant pain, so her family called EMS.  She received Zofran and 1/2 L fluid bolus in route to the ED.  She was noted to have elevated blood pressure in EMS and in the ED.  She received 1 dose of fentanyl 25 mcg for pain in the ED, which did seem to improve her abdominal pain.  She was also started on Zosyn following surgery recommendations.  According to patient's niece, she has significant neurocognitive decline and requires assistance with all ADLs.  Patient lives with her niece who is also her power of attorney.  Her niece says that they have never discussed end-of-life goals of care, but she is amenable to writing this discussion with palliative care.  Review Of Systems: Per HPI with the following additions:   Could not obtain ROS due to level 5 caveat (patient's dementia)  ROS  Patient Active Problem List   Diagnosis Date Noted  . Need for immunization against influenza 11/12/2017  . Prediabetes 11/12/2017  . Need for assistance with personal care 03/26/2017  . Abdominal pain, epigastric 03/09/2015  . Decreased mobility 11/23/2013  . Permanent atrial fibrillation ([place]) 07/30/2013  . CAD (coronary artery disease), native coronary artery 07/30/2013  . Left carotid stenosis 07/30/2013  . Need for home health care 04/07/2013  . Allergic rhinitis 01/25/2013  . Dysphagia, unspecified(787.20) 01/13/2013  . Change in voice 01/10/2013  . Vitamin D deficiency 07/03/2012  . Left leg pain 07/02/2012  . Cautious gait 04/11/2010  . Chronic diastolic heart failure ([place]) 12/10/2009  . CORONARY ARTERY DISEASE 08/12/2006  . HYPERCHOLESTEROLEMIA 03/12/2006  . DEMENTIA, NOT SPECIFIED 03/12/2006   . HYPERTENSION, BENIGN SYSTEMIC 03/12/2006  . Atherosclerosis of native arteries of extremity with intermittent claudication ([place]) 03/12/2006  . PAD (peripheral artery disease) ([place]) 03/12/2006  . COPD (chronic obstructive pulmonary disease) ([place]) 03/12/2006  . OSTEOARTHRITIS, LOWER LEG 03/12/2006    Past Medical History: Past Medical History:  Diagnosis Date  . Atherosclerotic cardiovascular disease   . Atrial fibrillation ([place])   . Carotid disease, bilateral ([place])   . CVA 03/12/2006   Qualifier: Diagnosis of  By: Eusebio Friendly    . Dyslipidemia   . Hypertrophic cardiomyopathy ([place])   . PVD (peripheral vascular disease) ([place])   . Stroke ([place])   . Systemic hypertension   . TOBACCO DEPENDENCE 03/12/2006   Qualifier: Diagnosis of  By: Eusebio Friendly      Past Surgical History: Past Surgical History:  Procedure Laterality Date  . CARDIAC CATHETERIZATION  01/27/2006   total occlusion of the mid RCA,80% stenosis of the distal left main, total occlusion of the mid LAD following the first septal & first diagonal arteries,total occlusion of the left CX followint the first oblique marginal artery, reconstitution of the distal LAD and distal RCA via collaterals  . NM MYOCAR PERF WALL MOTION  11/25/2005   intermediate risk - mild inferolateral ischemia towards the apex extending to mid LV.  Marland Kitchen [place]    Social History: Social History  Tobacco Use  . Smoking status: Former Smoker    Packs/day: 0.50    Years: 50.00    Pack years: 25.00    Types: Cigarettes    Start date: 01/13/1949    Quit date: 01/14/1999    Years since quitting: 19.4  . Smokeless tobacco: Never Used  Substance Use Topics  . Alcohol use: No    Alcohol/week: 0.0 standard drinks  . Drug use: No   Additional social history:   Please also refer to relevant sections of EMR.  Family History: Family History  Problem Relation Age of Onset  . Diabetes Father   . Cancer Sister   .  Hypertension Sister    (If not completed, MUST add something in)  Allergies and Medications: No Known Allergies No current facility-administered medications on file prior to encounter.    Current Outpatient Medications on File Prior to Encounter  Medication Sig Dispense Refill  . albuterol (PROVENTIL) (2.5 MG/3ML) 0.083% nebulizer solution USE 1 VAL IN NEBULIZER EVERY 6 HOURS AS NEEDED FOR WHEEZING OR SHORTNESS OF BREATH 150 mL 1  . aspirin (ADULT ASPIRIN EC LOW STRENGTH) 81 MG EC tablet Take 81 mg by mouth daily.     . cetirizine (ZYRTEC) 5 MG tablet TAKE 1 TABLET (5 MG TOTAL) BY MOUTH AT BEDTIME. 90 tablet 0  . ergocalciferol (VITAMIN D2) 50000 UNITS capsule Take 50,000 Units by mouth once a week. Unknown day of the week    . furosemide (LASIX) 40 MG tablet Take 1 tablet (40 mg total) by mouth daily. 30 tablet 12  . metoprolol succinate (TOPROL-XL) 100 MG 24 hr tablet TAKE 1 TABLET (100 MG TOTAL) DAILY BY MOUTH. TAKE WITH OR IMMEDIATELY FOLLOWING A MEAL. 90 tablet 1  . pantoprazole (PROTONIX) 20 MG tablet TAKE 1 TABLET BY MOUTH EVERY DAY 90 tablet 1  . simvastatin (ZOCOR) 40 MG tablet TAKE ONE-HALF TABLET BY MOUTH EVERY NIGHT AT BEDTIME 45 tablet 2  . umeclidinium-vilanterol (ANORO ELLIPTA) 62.5-25 MCG/INH AEPB Inhale 1 puff into the lungs daily. 1 each 2    Objective: BP (!) 188/93   Pulse 84   Temp (!) 97.5 F (36.4 C) (Oral)   Resp 19   Ht 5' (1.524 m)   Wt 40.8 kg   SpO2 100%   BMI 17.58 kg/m  Physical Exam Constitutional:      General: She is awake. She is not in acute distress.    Appearance: She is cachectic.     Comments: Appears frail, oriented only to self  HENT:     Head: Normocephalic and atraumatic.     Right Ear: External ear normal.     Left Ear: External ear normal.     Nose: Nose normal. No congestion or rhinorrhea.     Mouth/Throat:     Mouth: Mucous membranes are moist.  Eyes:     Conjunctiva/sclera: Conjunctivae normal.  Neck:     Musculoskeletal:  Normal range of motion.  Cardiovascular:     Rate and Rhythm: Normal rate. Rhythm irregular.  Pulmonary:     Effort: Pulmonary effort is normal.     Breath sounds: Normal breath sounds.  Abdominal:     General: Abdomen is flat. Bowel sounds are decreased.     Palpations: Abdomen is soft.     Tenderness: There is generalized abdominal tenderness. There is no guarding or rebound.  Musculoskeletal: Normal range of motion.     Left lower leg: Edema (trace) present.  Skin:    General:  Skin is warm and dry.  Neurological:     Mental Status: She is alert.  Psychiatric:        Behavior: Behavior is cooperative.      Labs and Imaging: CBC BMET  Recent Labs  Lab 06/19/2018 1925  WBC 3.8*  HGB 12.3  HCT 40.6  PLT 133*   Recent Labs  Lab 06/18/2018 1925  NA 143  K 4.1  CL 108  CO2 23  BUN 13  CREATININE 0.92  GLUCOSE 122*  CALCIUM 8.9     Ct Angio Abd/pel W And/or Wo Contrast  Result Date: 07/01/2018 CLINICAL DATA:  Severe abdominal pain tonight. Nausea and vomiting. History of atrial fibrillation. No anticoagulation. EXAM: CTA ABDOMEN AND PELVIS wITHOUT AND WITH CONTRAST TECHNIQUE: Multidetector CT imaging of the abdomen and pelvis was performed using the standard protocol during bolus administration of intravenous contrast. Multiplanar reconstructed images and MIPs were obtained and reviewed to evaluate the vascular anatomy. CONTRAST:  1102mL OMNIPAQUE IOHEXOL 350 MG/ML SOLN COMPARISON:  None. FINDINGS: VASCULAR Aorta: Rounded thrombus in the upper abdominal aorta at the origin of the celiac artery. This measures approximately 8 x 16 mm. Diffuse calcified aortic atherosclerosis. Eccentric mural thrombus in the distal aorta causing approximately 30% luminal narrowing. Celiac: Thrombus occludes the origin over course of 5 mm there is distal reconstitution. Hepatic artery is diminutive. No opacification of the splenic artery. SMA: Completely occluded from the origin over course of 5 cm.  There is faint distal reconstitution, image 51 series 5. Renals: Atherosclerotic plaque causes 50% luminal narrowing of the right renal artery. Plaque at the origin of the left renal artery causes narrowing of greater than 50%. Small accessory left renal artery. IMA: Not visualized. Inflow: Prominently tortuous. Calcified noncalcified plaque at the origin of the right common iliac artery causes 50% luminal narrowing. Noncalcified plaque causes approximately 75% luminal narrowing of the more distal common iliac artery. Sluggish flow through the right external iliac artery which is best demonstrated on the venous phase. Plaque throughout the left common iliac artery with only mild stenosis. Slow flow in the left external iliac artery. Proximal Outflow: Occlusion of the right common femoral artery. Blood flow in the left common femoral artery with no flow in the left fem bypass graft. Minimal fluid noted in the left profundus femoral artery. Veins: Contrast refluxes into the hepatic veins and IVC. No IVC thrombus. Portal vein is patent. Review of the MIP images confirms the above findings. NON-VASCULAR Lower chest: Cardiomegaly with marked right heart dilatation. Mild right lung base atelectasis. Hepatobiliary: Heterogeneous hepatic perfusion. Contrast refluxes into the hepatic veins and IVC. Gallstone within the gallbladder. No biliary dilatation. Pancreas: No ductal dilatation or inflammation. Spleen: Normal in size with decreased density. Adrenals/Urinary Tract: Hyperenhancing kidneys. Left greater than right renal cortical scarring. No hydronephrosis. Hyperenhancing adrenal glands. No bladder wall thickening. Stomach/Bowel: Bowel evaluation limited in the absence of contrast and paucity of intra-abdominal fat. No gastric wall thickening. Fluid-filled small bowel with questionable non enhancement of small bowel loops in the pelvis, image 44 and 48 series 11. Small bowel approaches left inguinal canal. Associated  mesenteric edema. Fluid-filled colon without colonic wall thickening. Distal colonic diverticulosis without diverticulitis. No pneumatosis. Lymphatic: No bulky abdominopelvic adenopathy. Reproductive: Atrophic uterus tentatively visualized. No suspicious adnexal mass. Other: Generalized edema in the lower abdominal mesentery. No evidence of free air. Musculoskeletal: There are no acute or suspicious osseous abnormalities. IMPRESSION: VASCULAR 1. Complete occlusion of the SMA from the origin over 5  cm. Faint distal reconstitution. 2. Thrombus in the upper abdominal aorta occludes the origin of the celiac artery for approximately 5 mm, there is distal reconstitution. 3. No flow visualized in the IMA. 4. Diffuse calcified and noncalcified atheromatous plaque throughout the abdominal aorta. 5. Thrombus in the right common iliac and external iliac arteries. There is sluggish flow in the right external iliac artery which is best appreciated on venous phase imaging. The right common femoral artery appears occluded. Acuity of this is uncertain. 6. Left femoral bypass graft is occluded at the origin, partially included. Moderate atherosclerosis involving the left common and external iliac arteries. NON-VASCULAR 1. Findings suspicious for small bowel ischemia involving pelvic bowel loops with wall thickening and questionable areas of nonenhancement. 2. Hyperenhancing kidneys and adrenal glands, nonspecific but can be seen and shock. 3. Marked right heart dilatation. Contrast refluxing into the hepatic veins and IVC consistent with elevated right heart pressures. 4. Heterogeneous hepatic perfusion likely due to altered hemodynamics. 5. Incidental findings of cholelithiasis, bilateral renal cortical scarring, and colonic diverticulosis. Critical Value/emergent results were called by telephone at the time of interpretation on 07/07/2018 at 9:43 pm to [facility] , who verbally acknowledged these results. Electronically  Signed   By: Keith Rake M.D.   On: 06/19/2018 21:46     Kathrene Alu, MD 06/21/2018, 11:51 PM PGY-2, [place] Intern pager: [phone], text pages welcome

## 2018-07-03 NOTE — ED Provider Notes (Signed)
[facility] EMERGENCY DEPARTMENT Provider Note   CSN: [id number] Arrival date & time: 06/15/2018  1824     History   Chief Complaint Chief Complaint  Patient presents with   Emesis   Abdominal Pain    HPI Bethany Cardenas is a 85 y.o. female.     Patient with history of atrial fibrillation, not currently on anticoagulation, chronic heart failure, peripheral vascular disease, stroke --presents to the emergency department tonight with complaint of abdominal pain.  Patient lives at home with her niece Inez Catalina.  History from EMS as well as niece.  Patient was getting up to use the restroom before dinner.  She had a bowel movement.  Patient called out in extreme pain and had 2 or 3 episodes of vomiting after this occurred.  She continued to have significant pain after sitting down and family called EMS.  Patient was transported to the hospital.  She received Zofran and a fluid bolus..  Blood pressure is elevated in route to the hospital.  Patient currently complains of some minor abdominal pain.  She is recently seen a podiatrist for a right foot sore that has been slow to heal.  Symptoms acute.  Course is improving.  Nothing makes symptoms better or worse.     Past Medical History:  Diagnosis Date   Atherosclerotic cardiovascular disease    Atrial fibrillation ([place])    Carotid disease, bilateral ([place])    CVA 03/12/2006   Qualifier: Diagnosis of  By: Eusebio Friendly     Dyslipidemia    Hypertrophic cardiomyopathy ([place])    PVD (peripheral vascular disease) ([place])    Stroke ([place])    Systemic hypertension    TOBACCO DEPENDENCE 03/12/2006   Qualifier: Diagnosis of  By: Eusebio Friendly      Patient Active Problem List   Diagnosis Date Noted   Need for immunization against influenza 11/12/2017   Prediabetes 11/12/2017   Need for assistance with personal care 03/26/2017   Abdominal pain, epigastric 03/09/2015   Decreased mobility 11/23/2013    Permanent atrial fibrillation ([place]) 07/30/2013   CAD (coronary artery disease), native coronary artery 07/30/2013   Left carotid stenosis 07/30/2013   Need for home health care 04/07/2013   Allergic rhinitis 01/25/2013   Dysphagia, unspecified(787.20) 01/13/2013   Change in voice 01/10/2013   Vitamin D deficiency 07/03/2012   Left leg pain 07/02/2012   Cautious gait 04/11/2010   Chronic diastolic heart failure ([place]) 12/10/2009   CORONARY ARTERY DISEASE 08/12/2006   HYPERCHOLESTEROLEMIA 03/12/2006   DEMENTIA, NOT SPECIFIED 03/12/2006   HYPERTENSION, BENIGN SYSTEMIC 03/12/2006   Atherosclerosis of native arteries of extremity with intermittent claudication ([place]) 03/12/2006   PAD (peripheral artery disease) ([place]) 03/12/2006   COPD (chronic obstructive pulmonary disease) ([place]) 03/12/2006   OSTEOARTHRITIS, LOWER LEG 03/12/2006    Past Surgical History:  Procedure Laterality Date   CARDIAC CATHETERIZATION  01/27/2006   total occlusion of the mid RCA,80% stenosis of the distal left main, total occlusion of the mid LAD following the first septal & first diagonal arteries,total occlusion of the left CX followint the first oblique marginal artery, reconstitution of the distal LAD and distal RCA via collaterals   NM MYOCAR PERF WALL MOTION  11/25/2005   intermediate risk - mild inferolateral ischemia towards the apex extending to mid LV.   [place]     OB History   No obstetric history on file.      Home Medications  Prior to Admission medications   Medication Sig Start Date End Date Taking? Authorizing Provider  albuterol (PROVENTIL) (2.5 MG/3ML) 0.083% nebulizer solution USE 1 VAL IN NEBULIZER EVERY 6 HOURS AS NEEDED FOR WHEEZING OR SHORTNESS OF BREATH 05/18/18   Rory Percy, DO  aspirin (ADULT ASPIRIN EC LOW STRENGTH) 81 MG EC tablet Take 81 mg by mouth daily.     [provider]  cetirizine (ZYRTEC) 5 MG tablet TAKE 1 TABLET (5 MG  TOTAL) BY MOUTH AT BEDTIME. 06/12/16   Ronnie Doss M, DO  ergocalciferol (VITAMIN D2) 50000 UNITS capsule Take 50,000 Units by mouth once a week. Unknown day of the week    [provider]  furosemide (LASIX) 40 MG tablet Take 1 tablet (40 mg total) by mouth daily. 02/09/17   Eloise Levels, MD  metoprolol succinate (TOPROL-XL) 100 MG 24 hr tablet TAKE 1 TABLET (100 MG TOTAL) DAILY BY MOUTH. TAKE WITH OR IMMEDIATELY FOLLOWING A MEAL. 09/21/17 09/16/18  Croitoru, Mihai, MD  pantoprazole (PROTONIX) 20 MG tablet TAKE 1 TABLET BY MOUTH EVERY DAY 06/08/18   Sherene Sires, DO  simvastatin (ZOCOR) 40 MG tablet TAKE ONE-HALF TABLET BY MOUTH EVERY NIGHT AT BEDTIME 09/29/17   Bland, Scott, DO  umeclidinium-vilanterol (ANORO ELLIPTA) 62.5-25 MCG/INH AEPB Inhale 1 puff into the lungs daily. 11/10/17   Sherene Sires, DO    Family History Family History  Problem Relation Age of Onset   Diabetes Father    Cancer Sister    Hypertension Sister     Social History Social History   Tobacco Use   Smoking status: Former Smoker    Packs/day: 0.50    Years: 50.00    Pack years: 25.00    Types: Cigarettes    Start date: 01/13/1949    Quit date: 01/14/1999    Years since quitting: 19.4   Smokeless tobacco: Never Used  Substance Use Topics   Alcohol use: No    Alcohol/week: 0.0 standard drinks   Drug use: No     Allergies   Patient has no known allergies.   Review of Systems Review of Systems  Constitutional: Negative for fever.  HENT: Negative for rhinorrhea and sore throat.   Eyes: Negative for redness.  Respiratory: Negative for cough.   Cardiovascular: Negative for chest pain.  Gastrointestinal: Positive for abdominal pain, nausea and vomiting. Negative for diarrhea.  Genitourinary: Negative for dysuria.  Musculoskeletal: Positive for myalgias.  Skin: Positive for wound. Negative for rash.  Neurological: Negative for headaches.     Physical Exam Updated Vital Signs BP (!)  179/92 (BP Location: Right Arm)    Pulse 84    Temp (!) 97.5 F (36.4 C) (Oral)    Resp 19    Ht 5' (1.524 m)    Wt 40.8 kg    SpO2 100%    BMI 17.58 kg/m   Physical Exam Vitals signs and nursing note reviewed.  Constitutional:      Appearance: She is well-developed.  HENT:     Head: Normocephalic and atraumatic.  Eyes:     General:        Right eye: No discharge.        Left eye: No discharge.     Conjunctiva/sclera: Conjunctivae normal.  Neck:     Musculoskeletal: Normal range of motion and neck supple.  Cardiovascular:     Rate and Rhythm: Normal rate and regular rhythm.     Pulses:  Dorsalis pedis pulses are detected w/ Doppler on the right side and detected w/ Doppler on the left side.     Heart sounds: Normal heart sounds.  Pulmonary:     Effort: Pulmonary effort is normal.     Breath sounds: Normal breath sounds.  Abdominal:     Palpations: Abdomen is soft.     Tenderness: There is generalized abdominal tenderness (mild ).  Skin:    General: Skin is warm and dry.     Comments: Skin of the bilateral lower legs and feet are warm.  Severe bunions noted.  Neurological:     Mental Status: She is alert.      ED Treatments / Results  Labs (all labs ordered are listed, but only abnormal results are displayed) Labs Reviewed  CBC WITH DIFFERENTIAL/PLATELET - Abnormal; Notable for the following components:      Result Value   WBC 3.8 (*)    Platelets 133 (*)    All other components within normal limits  COMPREHENSIVE METABOLIC PANEL - Abnormal; Notable for the following components:   Glucose, Bld 122 (*)    GFR calc non Af Amer 54 (*)    All other components within normal limits  PROTIME-INR - Abnormal; Notable for the following components:   Prothrombin Time 18.3 (*)    INR 1.5 (*)    All other components within normal limits  LACTIC ACID, PLASMA - Abnormal; Notable for the following components:   Lactic Acid, Venous 3.3 (*)    All other components within  normal limits  SARS CORONAVIRUS 2 (HOSPITAL ORDER, [facility])  TROPONIN I  LIPASE, BLOOD  HEPARIN LEVEL (UNFRACTIONATED)  CBC    EKG EKG Interpretation  Date/Time:  Saturday July 03 2018 18:47:21 EDT Ventricular Rate:  86 PR Interval:    QRS Duration: 98 QT Interval:  386 QTC Calculation: 462 R Axis:   -116 Text Interpretation:  Atrial fibrillation Left anterior fascicular block Probable anterior infarct, old TW more peaked anterior leads than previous, no other significant changes Confirmed by Gareth Morgan [id number]) on 06/14/2018 8:29:39 PM   Radiology Ct Angio Abd/pel W And/or Wo Contrast  Result Date: 06/28/2018 CLINICAL DATA:  Severe abdominal pain tonight. Nausea and vomiting. History of atrial fibrillation. No anticoagulation. EXAM: CTA ABDOMEN AND PELVIS wITHOUT AND WITH CONTRAST TECHNIQUE: Multidetector CT imaging of the abdomen and pelvis was performed using the standard protocol during bolus administration of intravenous contrast. Multiplanar reconstructed images and MIPs were obtained and reviewed to evaluate the vascular anatomy. CONTRAST:  186mL OMNIPAQUE IOHEXOL 350 MG/ML SOLN COMPARISON:  None. FINDINGS: VASCULAR Aorta: Rounded thrombus in the upper abdominal aorta at the origin of the celiac artery. This measures approximately 8 x 16 mm. Diffuse calcified aortic atherosclerosis. Eccentric mural thrombus in the distal aorta causing approximately 30% luminal narrowing. Celiac: Thrombus occludes the origin over course of 5 mm there is distal reconstitution. Hepatic artery is diminutive. No opacification of the splenic artery. SMA: Completely occluded from the origin over course of 5 cm. There is faint distal reconstitution, image 51 series 5. Renals: Atherosclerotic plaque causes 50% luminal narrowing of the right renal artery. Plaque at the origin of the left renal artery causes narrowing of greater than 50%. Small accessory left renal artery. IMA:  Not visualized. Inflow: Prominently tortuous. Calcified noncalcified plaque at the origin of the right common iliac artery causes 50% luminal narrowing. Noncalcified plaque causes approximately 75% luminal narrowing of the more distal common iliac  artery. Sluggish flow through the right external iliac artery which is best demonstrated on the venous phase. Plaque throughout the left common iliac artery with only mild stenosis. Slow flow in the left external iliac artery. Proximal Outflow: Occlusion of the right common femoral artery. Blood flow in the left common femoral artery with no flow in the left fem bypass graft. Minimal fluid noted in the left profundus femoral artery. Veins: Contrast refluxes into the hepatic veins and IVC. No IVC thrombus. Portal vein is patent. Review of the MIP images confirms the above findings. NON-VASCULAR Lower chest: Cardiomegaly with marked right heart dilatation. Mild right lung base atelectasis. Hepatobiliary: Heterogeneous hepatic perfusion. Contrast refluxes into the hepatic veins and IVC. Gallstone within the gallbladder. No biliary dilatation. Pancreas: No ductal dilatation or inflammation. Spleen: Normal in size with decreased density. Adrenals/Urinary Tract: Hyperenhancing kidneys. Left greater than right renal cortical scarring. No hydronephrosis. Hyperenhancing adrenal glands. No bladder wall thickening. Stomach/Bowel: Bowel evaluation limited in the absence of contrast and paucity of intra-abdominal fat. No gastric wall thickening. Fluid-filled small bowel with questionable non enhancement of small bowel loops in the pelvis, image 44 and 48 series 11. Small bowel approaches left inguinal canal. Associated mesenteric edema. Fluid-filled colon without colonic wall thickening. Distal colonic diverticulosis without diverticulitis. No pneumatosis. Lymphatic: No bulky abdominopelvic adenopathy. Reproductive: Atrophic uterus tentatively visualized. No suspicious adnexal mass.  Other: Generalized edema in the lower abdominal mesentery. No evidence of free air. Musculoskeletal: There are no acute or suspicious osseous abnormalities. IMPRESSION: VASCULAR 1. Complete occlusion of the SMA from the origin over 5 cm. Faint distal reconstitution. 2. Thrombus in the upper abdominal aorta occludes the origin of the celiac artery for approximately 5 mm, there is distal reconstitution. 3. No flow visualized in the IMA. 4. Diffuse calcified and noncalcified atheromatous plaque throughout the abdominal aorta. 5. Thrombus in the right common iliac and external iliac arteries. There is sluggish flow in the right external iliac artery which is best appreciated on venous phase imaging. The right common femoral artery appears occluded. Acuity of this is uncertain. 6. Left femoral bypass graft is occluded at the origin, partially included. Moderate atherosclerosis involving the left common and external iliac arteries. NON-VASCULAR 1. Findings suspicious for small bowel ischemia involving pelvic bowel loops with wall thickening and questionable areas of nonenhancement. 2. Hyperenhancing kidneys and adrenal glands, nonspecific but can be seen and shock. 3. Marked right heart dilatation. Contrast refluxing into the hepatic veins and IVC consistent with elevated right heart pressures. 4. Heterogeneous hepatic perfusion likely due to altered hemodynamics. 5. Incidental findings of cholelithiasis, bilateral renal cortical scarring, and colonic diverticulosis. Critical Value/emergent results were called by telephone at the time of interpretation on 07/07/2018 at 9:43 pm to PA [facility] , who verbally acknowledged these results. Electronically Signed   By: Keith Rake M.D.   On: 06/28/2018 21:46    Procedures Procedures (including critical care time)  Medications Ordered in ED Medications  heparin ADULT infusion 100 units/mL (25000 units/25mL sodium chloride 0.45%) (650 Units/hr Intravenous New  Bag/Given 07/06/2018 2228)  piperacillin-tazobactam (ZOSYN) IVPB 3.375 g (has no administration in time range)  fentaNYL (SUBLIMAZE) injection 25 mcg (25 mcg Intravenous Given 06/19/2018 2249)  iohexol (OMNIPAQUE) 350 MG/ML injection 100 mL (100 mLs Intravenous Contrast Given 07/02/2018 2105)  heparin bolus via infusion 2,000 Units (2,000 Units Intravenous Bolus from Bag 06/15/2018 2228)  piperacillin-tazobactam (ZOSYN) IVPB 3.375 g (0 g Intravenous Stopped 07/07/2018 2318)  sodium chloride 0.9 % bolus 500  mL (500 mLs Intravenous New Bag/Given 06/20/2018 2250)     Initial Impression / Assessment and Plan / ED Course  I have reviewed the triage vital signs and the nursing notes.  Pertinent labs & imaging results that were available during my care of the patient were reviewed by me and considered in my medical decision making (see chart for details).        Patient seen and examined.  I spoke with patient's niece by telephone.  Concern for mesenteric ischemia based on history and evaluation. Labs ordered. Will check CT angiography if creatinine allows.   Vital signs reviewed and are as follows: BP (!) 179/92 (BP Location: Right Arm)    Pulse 84    Temp (!) 97.5 F (36.4 C) (Oral)    Resp 19    Ht 5' (1.524 m)    Wt 40.8 kg    SpO2 100%    BMI 17.58 kg/m   CT results discussed with patient's niece.  She is her power of attorney.  We discussed need for treatment and admission.  Discussed overall poor prognosis of patient's condition.  She is currently a full code.  He states that they have never really had a discussion over her wishes at end-of-life.  Discussed that her prognosis overall is poor and we may need to address these goals as her course unfolds.  Patient has been seen by Dr. Billy Fischer.  She has spoken with Dr. Redmond Pulling.  I spoke with Dr. Carlis Abbott of vascular surgery who will review imaging.  11:12 PM Dr. Carlis Abbott will see patient. Will admit to [facility].   11:27 PM Spoke with [facility] resident who will see.    CRITICAL CARE Performed by: Carlisle Cater  PA-C Total critical care time: 60 minutes Critical care time was exclusive of separately billable procedures and treating other patients. Critical care was necessary to treat or prevent imminent or life-threatening deterioration. Critical care was time spent personally by me on the following activities: development of treatment plan with patient and/or surrogate as well as nursing, discussions with consultants, evaluation of patient's response to treatment, examination of patient, obtaining history from patient or surrogate, ordering and performing treatments and interventions, ordering and review of laboratory studies, ordering and review of radiographic studies, pulse oximetry and re-evaluation of patient's condition.   Final Clinical Impressions(s) / ED Diagnoses   Final diagnoses:  Mesenteric ischemia ([place])  Permanent atrial fibrillation   Admit.   ED Discharge Orders    None       Carlisle Cater, Hershal Coria 06/19/2018 2342    Gareth Morgan, MD 08/01/2018 1451

## 2018-07-03 NOTE — ED Triage Notes (Signed)
Pt arrives with [facility] from home; c/o generalized abdominal pain. EMS was called by pt's caregiver and reported pt has been nausea and vomitted in the bathroom. Pt is a&o x2 to person and situation and is at baseline. Pt has hx of HTN and afib. BP en route to ED was 220/80 on monitor, 180/100 manual. HR 90's, 96% O2 on RA. Pt was given 4mg  zofran and 250 ml NS bolus by EMS. CBG was 188.

## 2018-07-03 NOTE — ED Notes (Signed)
ED Provider at bedside. 

## 2018-07-03 NOTE — Progress Notes (Signed)
CONSULT NOTE - Initial Consult  Pharmacy Consult for heparin, zosyn Indication:VTE, intra-abdominal infection  No Known Allergies  Patient Measurements: Height: 5' (152.4 cm) Weight: 90 lb (40.8 kg) IBW/kg (Calculated) : 45.5    Vital Signs: Temp: 97.5 F (36.4 C) (06/20 1840) Temp Source: Oral (06/20 1840) BP: 179/92 (06/20 1840) Pulse Rate: 84 (06/20 1840)  Labs: Recent Labs    06/19/2018 1925  HGB 12.3  HCT 40.6  PLT 133*  LABPROT 18.3*  INR 1.5*  CREATININE 0.92  TROPONINI <0.03    Estimated Creatinine Clearance: 24.6 mL/min (by C-G formula based on SCr of 0.92 mg/dL).   Medical History: Past Medical History:  Diagnosis Date  . Atherosclerotic cardiovascular disease   . Atrial fibrillation ([place])   . Carotid disease, bilateral ([place])   . CVA 03/12/2006   Qualifier: Diagnosis of  By: Eusebio Friendly    . Dyslipidemia   . Hypertrophic cardiomyopathy ([place])   . PVD (peripheral vascular disease) ([place])   . Stroke ([place])   . Systemic hypertension   . TOBACCO DEPENDENCE 03/12/2006   Qualifier: Diagnosis of  By: Eusebio Friendly       Assessment: 85 yo female with abdominal pain. Pharmacy consulted to dose heparin for possible VTE with mesenteric ischemia and zosyn for possible intra-abdominal ischemia. -WBC= 3.8, SCr= 0.92, CrCL ~ 25   Goal of Therapy:  Heparin level 0.3-0.7 units/ml Monitor platelets by anticoagulation protocol: Yes   Plan:  -Heparin bolus 2000 units IV followed by 650 units/hr  -Heparin level in 8 hours and daily wth CBC daily -Zosyn 3.375gm IV q8hr  Hildred Laser, PharmD Clinical Pharmacist **Pharmacist phone directory can now be found on [email or web address] ([email or web address]).  Listed under [place].

## 2018-07-03 NOTE — Consult Note (Signed)
Reason for Consult:possible ischemic bowel Referring Physician: dr Elby Showers is an 85 y.o. female.  HPI: (history obtained from chart/PA, pt not able to contribute)  85yo AAF with afib, htn, chf, PAD, CAD came to ED via EMS after developing acute abd pain earlier this pm. By report, Patient lives at home with her niece Inez Catalina.  History from EMS as well as niece as obtained by PA.  Patient was getting up to use the restroom before dinner.  She had a bowel movement.  Patient called out in extreme pain and had 2 or 3 episodes of vomiting after this occurred.  She continued to have significant pain after sitting down and family called EMS.  Patient was transported to the hospital.  She received Zofran and a fluid bolus..  Blood pressure is elevated in route to the hospital.  Patient currently complains of some minor abdominal pain.   Pt nods head when ask if having abd pain but otherwise can't get history from her.   Past Medical History:  Diagnosis Date  . Atherosclerotic cardiovascular disease   . Atrial fibrillation ([place])   . Carotid disease, bilateral ([place])   . CVA 03/12/2006   Qualifier: Diagnosis of  By: Eusebio Friendly    . Dyslipidemia   . Hypertrophic cardiomyopathy ([place])   . PVD (peripheral vascular disease) ([place])   . Stroke ([place])   . Systemic hypertension   . TOBACCO DEPENDENCE 03/12/2006   Qualifier: Diagnosis of  By: Eusebio Friendly      Past Surgical History:  Procedure Laterality Date  . CARDIAC CATHETERIZATION  01/27/2006   total occlusion of the mid RCA,80% stenosis of the distal left main, total occlusion of the mid LAD following the first septal & first diagonal arteries,total occlusion of the left CX followint the first oblique marginal artery, reconstitution of the distal LAD and distal RCA via collaterals  . NM MYOCAR PERF WALL MOTION  11/25/2005   intermediate risk - mild inferolateral ischemia towards the apex extending to mid LV.  Marland Kitchen VEIN SURGERY   1999 & 2000    Family History  Problem Relation Age of Onset  . Diabetes Father   . Cancer Sister   . Hypertension Sister     Social History:  reports that she quit smoking about 19 years ago. Her smoking use included cigarettes. She started smoking about 69 years ago. She has a 25.00 pack-year smoking history. She has never used smokeless tobacco. She reports that she does not drink alcohol or use drugs.  Allergies: No Known Allergies  Medications: I have reviewed the patient's current medications.  Results for orders placed or performed during the hospital encounter of 06/26/2018 (from the past 48 hour(s))  CBC with Differential/Platelet     Status: Abnormal   Collection Time: 06/15/2018  7:25 PM  Result Value Ref Range   WBC 3.8 (L) 4.0 - 10.5 K/uL   RBC 4.14 3.87 - 5.11 MIL/uL   Hemoglobin 12.3 12.0 - 15.0 g/dL   HCT 40.6 36.0 - 46.0 %   MCV 98.1 80.0 - 100.0 fL   MCH 29.7 26.0 - 34.0 pg   MCHC 30.3 30.0 - 36.0 g/dL   RDW 14.2 11.5 - 15.5 %   Platelets 133 (L) 150 - 400 K/uL    Comment: REPEATED TO VERIFY SPECIMEN CHECKED FOR CLOTS    nRBC 0.0 0.0 - 0.2 %   Neutrophils Relative % 65 %   Neutro Abs 2.5 1.7 - 7.7 K/uL  Lymphocytes Relative 21 %   Lymphs Abs 0.8 0.7 - 4.0 K/uL   Monocytes Relative 11 %   Monocytes Absolute 0.4 0.1 - 1.0 K/uL   Eosinophils Relative 2 %   Eosinophils Absolute 0.1 0.0 - 0.5 K/uL   Basophils Relative 1 %   Basophils Absolute 0.0 0.0 - 0.1 K/uL   Immature Granulocytes 0 %   Abs Immature Granulocytes 0.01 0.00 - 0.07 K/uL    Comment: Performed at [facility], [place] [address], [place], [place] [address]  Comprehensive metabolic panel     Status: Abnormal   Collection Time: 06/26/2018  7:25 PM  Result Value Ref Range   Sodium 143 135 - 145 mmol/L   Potassium 4.1 3.5 - 5.1 mmol/L   Chloride 108 98 - 111 mmol/L   CO2 23 22 - 32 mmol/L   Glucose, Bld 122 (H) 70 - 99 mg/dL   BUN 13 8 - 23 mg/dL   Creatinine, Ser 0.92 0.44 - 1.00 mg/dL    Calcium 8.9 8.9 - 10.3 mg/dL   Total Protein 7.8 6.5 - 8.1 g/dL   Albumin 3.6 3.5 - 5.0 g/dL   AST 28 15 - 41 U/L   ALT 16 0 - 44 U/L   Alkaline Phosphatase 72 38 - 126 U/L   Total Bilirubin 0.6 0.3 - 1.2 mg/dL   GFR calc non Af Amer 54 (L) >60 mL/min   GFR calc Af Amer >60 >60 mL/min   Anion gap 12 5 - 15    Comment: Performed at [facility] [address] [place], [place] [address]  Protime-INR     Status: Abnormal   Collection Time: 07/10/2018  7:25 PM  Result Value Ref Range   Prothrombin Time 18.3 (H) 11.4 - 15.2 seconds   INR 1.5 (H) 0.8 - 1.2    Comment: (NOTE) INR goal varies based on device and disease states. Performed at [facility], [place] [address], [place], [place] [address]   Troponin I - ONCE - STAT     Status: None   Collection Time: 06/18/2018  7:25 PM  Result Value Ref Range   Troponin I <0.03 <0.03 ng/mL    Comment: Performed at [facility], [place] [address], [place], [place] [address]  Lipase, blood     Status: None   Collection Time: 06/22/2018  7:25 PM  Result Value Ref Range   Lipase 37 11 - 51 U/L    Comment: Performed at [facility], [place] [address], [place], [place] [address]  Lactic acid, plasma     Status: Abnormal   Collection Time: 06/21/2018  7:25 PM  Result Value Ref Range   Lactic Acid, Venous 3.3 (HH) 0.5 - 1.9 mmol/L    Comment: CRITICAL RESULT CALLED TO, READ BACK BY AND VERIFIED WITH: Louretta Parma RN AT 2023 07/02/2018 BY [facility] Performed at [facility], [address], [place], [place] [address]     Ct Angio Abd/pel W And/or Wo Contrast  Result Date: 06/23/2018 CLINICAL DATA:  Severe abdominal pain tonight. Nausea and vomiting. History of atrial fibrillation. No anticoagulation. EXAM: CTA ABDOMEN AND PELVIS wITHOUT AND WITH CONTRAST TECHNIQUE: Multidetector CT imaging of the abdomen and pelvis was performed using the standard protocol during bolus administration of intravenous contrast. Multiplanar  reconstructed images and MIPs were obtained and reviewed to evaluate the vascular anatomy. CONTRAST:  133mL OMNIPAQUE IOHEXOL 350 MG/ML SOLN COMPARISON:  None. FINDINGS: VASCULAR  Aorta: Rounded thrombus in the upper abdominal aorta at the origin of the celiac artery. This measures approximately 8 x 16 mm. Diffuse calcified aortic atherosclerosis. Eccentric mural thrombus in the distal aorta causing approximately 30% luminal narrowing. Celiac: Thrombus occludes the origin over course of 5 mm there is distal reconstitution. Hepatic artery is diminutive. No opacification of the splenic artery. SMA: Completely occluded from the origin over course of 5 cm. There is faint distal reconstitution, image 51 series 5. Renals: Atherosclerotic plaque causes 50% luminal narrowing of the right renal artery. Plaque at the origin of the left renal artery causes narrowing of greater than 50%. Small accessory left renal artery. IMA: Not visualized. Inflow: Prominently tortuous. Calcified noncalcified plaque at the origin of the right common iliac artery causes 50% luminal narrowing. Noncalcified plaque causes approximately 75% luminal narrowing of the more distal common iliac artery. Sluggish flow through the right external iliac artery which is best demonstrated on the venous phase. Plaque throughout the left common iliac artery with only mild stenosis. Slow flow in the left external iliac artery. Proximal Outflow: Occlusion of the right common femoral artery. Blood flow in the left common femoral artery with no flow in the left fem bypass graft. Minimal fluid noted in the left profundus femoral artery. Veins: Contrast refluxes into the hepatic veins and IVC. No IVC thrombus. Portal vein is patent. Review of the MIP images confirms the above findings. NON-VASCULAR Lower chest: Cardiomegaly with marked right heart dilatation. Mild right lung base atelectasis. Hepatobiliary: Heterogeneous hepatic perfusion. Contrast refluxes into the  hepatic veins and IVC. Gallstone within the gallbladder. No biliary dilatation. Pancreas: No ductal dilatation or inflammation. Spleen: Normal in size with decreased density. Adrenals/Urinary Tract: Hyperenhancing kidneys. Left greater than right renal cortical scarring. No hydronephrosis. Hyperenhancing adrenal glands. No bladder wall thickening. Stomach/Bowel: Bowel evaluation limited in the absence of contrast and paucity of intra-abdominal fat. No gastric wall thickening. Fluid-filled small bowel with questionable non enhancement of small bowel loops in the pelvis, image 44 and 48 series 11. Small bowel approaches left inguinal canal. Associated mesenteric edema. Fluid-filled colon without colonic wall thickening. Distal colonic diverticulosis without diverticulitis. No pneumatosis. Lymphatic: No bulky abdominopelvic adenopathy. Reproductive: Atrophic uterus tentatively visualized. No suspicious adnexal mass. Other: Generalized edema in the lower abdominal mesentery. No evidence of free air. Musculoskeletal: There are no acute or suspicious osseous abnormalities. IMPRESSION: VASCULAR 1. Complete occlusion of the SMA from the origin over 5 cm. Faint distal reconstitution. 2. Thrombus in the upper abdominal aorta occludes the origin of the celiac artery for approximately 5 mm, there is distal reconstitution. 3. No flow visualized in the IMA. 4. Diffuse calcified and noncalcified atheromatous plaque throughout the abdominal aorta. 5. Thrombus in the right common iliac and external iliac arteries. There is sluggish flow in the right external iliac artery which is best appreciated on venous phase imaging. The right common femoral artery appears occluded. Acuity of this is uncertain. 6. Left femoral bypass graft is occluded at the origin, partially included. Moderate atherosclerosis involving the left common and external iliac arteries. NON-VASCULAR 1. Findings suspicious for small bowel ischemia involving pelvic  bowel loops with wall thickening and questionable areas of nonenhancement. 2. Hyperenhancing kidneys and adrenal glands, nonspecific but can be seen and shock. 3. Marked right heart dilatation. Contrast refluxing into the hepatic veins and IVC consistent with elevated right heart pressures. 4. Heterogeneous hepatic perfusion likely due to altered hemodynamics. 5. Incidental findings of cholelithiasis, bilateral renal cortical scarring, and  colonic diverticulosis. Critical Value/emergent results were called by telephone at the time of interpretation on 06/25/2018 at 9:43 pm to PA [facility] , who verbally acknowledged these results. Electronically Signed   By: Keith Rake M.D.   On: 07/11/2018 21:46    Review of Systems  Unable to perform ROS: Dementia   Blood pressure (!) 188/93, pulse 84, temperature (!) 97.5 F (36.4 C), temperature source Oral, resp. rate 19, height 5' (1.524 m), weight 40.8 kg, SpO2 100 %. Physical Exam  Vitals reviewed. Constitutional: She appears well-developed. She appears cachectic. She has a sickly appearance (chronically ill appearance). No distress.  Underweight elderly aaf resting comfortably  HENT:  Head: Normocephalic and atraumatic.  Right Ear: External ear normal.  Left Ear: External ear normal.  B/l temporal wasting  Eyes: Conjunctivae are normal. No scleral icterus.  Neck: Normal range of motion. Neck supple. No tracheal deviation present. No thyromegaly present.  Cardiovascular: Normal heart sounds. An irregular rhythm present.  Doppler b/l DP  Respiratory: Effort normal and breath sounds normal. No stridor. No respiratory distress. She has no wheezes.  GI: Soft. Normal appearance. There is no rigidity, no rebound and no guarding.  Scaphoid abdomen. Soft, nd. A few BS. Mild TTP in lower abdomen. No guarding  Musculoskeletal:        General: No tenderness or edema.  Lymphadenopathy:    She has no cervical adenopathy.  Neurological: She is alert.  She exhibits normal muscle tone. GCS eye subscore is 4. GCS verbal subscore is 4. GCS motor subscore is 6.  Follows some commands  Skin: Skin is warm and dry. No rash noted. She is not diaphoretic. No erythema. No pallor.  1-2 Abrasion/skin tears on some dorsal toes  Psychiatric: She has a normal mood and affect. Her behavior is normal. Thought content normal. Cognition and memory are impaired. She expresses inappropriate judgment.  [place]. Doesn't answer when asked month, city, year. Nods her head to questions; follows some commands    Assessment/Plan: 85 yo with perm Afib (not on anticoag), htn, cad, chf, pad with SMA occlusion, Ao thrombus, Rt heart dilation with question of SB ischemia in pelvis Elevated lactate Rt common iliac & ext iliac thrombus & femoral art occlusion - chronicity undetermined  I tried reaching niece Inez Catalina on both numbers. PA has spoken with her twice and discuss initial dx.  Her niece called back to ED. I spoke with her for about 20 min. History as above. Several episodes of emesis this evening after c/o acute pain. Lives with niece. +dementia. Needs assist with ADLs (can feed herself, and dress mostly). Doesn't speak in full sentences. Discussed findings on CT. Discussed typical mgmt with bowel rest, IV heparin. She has extensive atherosclerotic disease in abd.  Discussed that if has frank ischemia surgical exploration with resection is usually performed but at her age, co-morbidities, dementia, functional status - surgery is probably not in her best interest. Discussed risk of leak, possible ostomy, cardiac/pulm events, organ failure, vent dependence, prolonged hospitalization, death & even if survives very high likelihood of dc to nursing home.   Discussed palliative care consult to establish goals of care. Introduced code status discussion to niece. She voiced that she understood dx and potential implications, appreciated time going over everything.   She does not have  guarding/peritonitis/rebounding.  SB difficult to interpret on CT given lack of contrast and intra-abd fat. No PV gas. No pneumatosis.   I would recommend IV heparin, IVF, iv abx, trend lactate  and reassess in AM I don't think she has frankly nonviable sb right now At her age, functional status, dementia, I don't think surgery is in her best interest.  Palliative care consult in am  Leighton Ruff. Redmond Pulling, MD, FACS General, Bariatric, & Minimally Invasive Surgery [facility]   Greer Pickerel 07/04/2018, 10:54 PM

## 2018-07-04 DIAGNOSIS — R1084 Generalized abdominal pain: Secondary | ICD-10-CM | POA: Diagnosis not present

## 2018-07-04 DIAGNOSIS — F0390 Unspecified dementia without behavioral disturbance: Secondary | ICD-10-CM | POA: Diagnosis present

## 2018-07-04 DIAGNOSIS — I110 Hypertensive heart disease with heart failure: Secondary | ICD-10-CM | POA: Diagnosis present

## 2018-07-04 DIAGNOSIS — I10 Essential (primary) hypertension: Secondary | ICD-10-CM | POA: Diagnosis not present

## 2018-07-04 DIAGNOSIS — Z515 Encounter for palliative care: Secondary | ICD-10-CM

## 2018-07-04 DIAGNOSIS — I4821 Permanent atrial fibrillation: Secondary | ICD-10-CM

## 2018-07-04 DIAGNOSIS — E162 Hypoglycemia, unspecified: Secondary | ICD-10-CM | POA: Diagnosis not present

## 2018-07-04 DIAGNOSIS — Z7982 Long term (current) use of aspirin: Secondary | ICD-10-CM | POA: Diagnosis not present

## 2018-07-04 DIAGNOSIS — I422 Other hypertrophic cardiomyopathy: Secondary | ICD-10-CM | POA: Diagnosis present

## 2018-07-04 DIAGNOSIS — Z7189 Other specified counseling: Secondary | ICD-10-CM | POA: Diagnosis not present

## 2018-07-04 DIAGNOSIS — I5032 Chronic diastolic (congestive) heart failure: Secondary | ICD-10-CM | POA: Diagnosis present

## 2018-07-04 DIAGNOSIS — Z8673 Personal history of transient ischemic attack (TIA), and cerebral infarction without residual deficits: Secondary | ICD-10-CM | POA: Diagnosis not present

## 2018-07-04 DIAGNOSIS — J449 Chronic obstructive pulmonary disease, unspecified: Secondary | ICD-10-CM | POA: Diagnosis present

## 2018-07-04 DIAGNOSIS — J309 Allergic rhinitis, unspecified: Secondary | ICD-10-CM | POA: Diagnosis present

## 2018-07-04 DIAGNOSIS — I2510 Atherosclerotic heart disease of native coronary artery without angina pectoris: Secondary | ICD-10-CM | POA: Diagnosis present

## 2018-07-04 DIAGNOSIS — I743 Embolism and thrombosis of arteries of the lower extremities: Secondary | ICD-10-CM | POA: Diagnosis present

## 2018-07-04 DIAGNOSIS — Z833 Family history of diabetes mellitus: Secondary | ICD-10-CM | POA: Diagnosis not present

## 2018-07-04 DIAGNOSIS — R64 Cachexia: Secondary | ICD-10-CM | POA: Diagnosis present

## 2018-07-04 DIAGNOSIS — K559 Vascular disorder of intestine, unspecified: Secondary | ICD-10-CM

## 2018-07-04 DIAGNOSIS — I2582 Chronic total occlusion of coronary artery: Secondary | ICD-10-CM | POA: Diagnosis present

## 2018-07-04 DIAGNOSIS — I745 Embolism and thrombosis of iliac artery: Secondary | ICD-10-CM | POA: Diagnosis present

## 2018-07-04 DIAGNOSIS — K551 Chronic vascular disorders of intestine: Secondary | ICD-10-CM | POA: Diagnosis present

## 2018-07-04 DIAGNOSIS — Z66 Do not resuscitate: Secondary | ICD-10-CM | POA: Diagnosis present

## 2018-07-04 DIAGNOSIS — Z8249 Family history of ischemic heart disease and other diseases of the circulatory system: Secondary | ICD-10-CM | POA: Diagnosis not present

## 2018-07-04 DIAGNOSIS — K55059 Acute (reversible) ischemia of intestine, part and extent unspecified: Secondary | ICD-10-CM | POA: Diagnosis not present

## 2018-07-04 DIAGNOSIS — Z1159 Encounter for screening for other viral diseases: Secondary | ICD-10-CM | POA: Diagnosis not present

## 2018-07-04 DIAGNOSIS — Z79899 Other long term (current) drug therapy: Secondary | ICD-10-CM | POA: Diagnosis not present

## 2018-07-04 DIAGNOSIS — Z681 Body mass index (BMI) 19 or less, adult: Secondary | ICD-10-CM | POA: Diagnosis not present

## 2018-07-04 DIAGNOSIS — E785 Hyperlipidemia, unspecified: Secondary | ICD-10-CM | POA: Diagnosis present

## 2018-07-04 LAB — BASIC METABOLIC PANEL
Anion gap: 13 (ref 5–15)
BUN: 10 mg/dL (ref 8–23)
CO2: 23 mmol/L (ref 22–32)
Calcium: 8.8 mg/dL — ABNORMAL LOW (ref 8.9–10.3)
Chloride: 105 mmol/L (ref 98–111)
Creatinine, Ser: 0.82 mg/dL (ref 0.44–1.00)
GFR calc Af Amer: >60 mL/min (ref >60)
GFR calc non Af Amer: >60 mL/min (ref >60)
Glucose, Bld: 115 mg/dL — ABNORMAL HIGH (ref 70–99)
Potassium: 4.1 mmol/L (ref 3.5–5.1)
Sodium: 141 mmol/L (ref 135–145)

## 2018-07-04 LAB — CBC
HCT: 37.9 % (ref 36.0–46.0)
Hemoglobin: 12.1 g/dL (ref 12.0–15.0)
MCH: 30.6 pg (ref 26.0–34.0)
MCHC: 31.9 g/dL (ref 30.0–36.0)
MCV: 95.7 fL (ref 80.0–100.0)
Platelets: 145 10*3/uL — ABNORMAL LOW (ref 150–400)
RBC: 3.96 MIL/uL (ref 3.87–5.11)
RDW: 14 % (ref 11.5–15.5)
WBC: 13.4 10*3/uL — ABNORMAL HIGH (ref 4.0–10.5)
nRBC: 0 % (ref 0.0–0.2)

## 2018-07-04 LAB — GLUCOSE, CAPILLARY
Glucose-Capillary: 109 mg/dL — ABNORMAL HIGH (ref 70–99)
Glucose-Capillary: <10 mg/dL — CL (ref 70–99)

## 2018-07-04 LAB — LACTIC ACID, PLASMA
Lactic Acid, Venous: 3 mmol/L (ref 0.5–1.9)
Lactic Acid, Venous: 3.7 mmol/L (ref 0.5–1.9)
Lactic Acid, Venous: 5 mmol/L (ref 0.5–1.9)
Lactic Acid, Venous: 6.6 mmol/L (ref 0.5–1.9)
Lactic Acid, Venous: 8.4 mmol/L (ref 0.5–1.9)

## 2018-07-04 LAB — SARS CORONAVIRUS 2 BY RT PCR (HOSPITAL ORDER, PERFORMED IN ~~LOC~~ HOSPITAL LAB): SARS Coronavirus 2: NEGATIVE

## 2018-07-04 LAB — HEPARIN LEVEL (UNFRACTIONATED): Heparin Unfractionated: 0.53 IU/mL (ref 0.30–0.70)

## 2018-07-04 MED ORDER — ASPIRIN 81 MG PO TBEC: 81.0000 mg | DELAYED_RELEASE_TABLET | Freq: Every day | ORAL | Status: DC

## 2018-07-04 MED ORDER — METOPROLOL SUCCINATE ER 100 MG PO TB24: 100.0000 mg | ORAL_TABLET | Freq: Every day | ORAL | Status: DC

## 2018-07-04 MED ORDER — LORAZEPAM 2 MG/ML PO CONC: 1.0000 mg | ORAL | Status: DC | PRN

## 2018-07-04 MED ORDER — UMECLIDINIUM-VILANTEROL 62.5-25 MCG/INH IN AEPB
1.0000 | INHALATION_SPRAY | Freq: Every day | RESPIRATORY_TRACT | Status: DC
  Filled 2018-07-04 (×2): qty 14

## 2018-07-04 MED ORDER — ACETAMINOPHEN 325 MG PO TABS: 650.0000 mg | ORAL_TABLET | Freq: Four times a day (QID) | ORAL | Status: DC

## 2018-07-04 MED ORDER — GLYCOPYRROLATE 1 MG PO TABS
1.0000 mg | ORAL_TABLET | ORAL | Status: DC | PRN
  Filled 2018-07-04: qty 1

## 2018-07-04 MED ORDER — DEXTROSE 50 % IV SOLN
INTRAVENOUS | Status: AC
  Administered 2018-07-04: 50 mL
  Filled 2018-07-04: qty 50

## 2018-07-04 MED ORDER — LORAZEPAM 1 MG PO TABS: 1.0000 mg | ORAL_TABLET | ORAL | Status: DC | PRN

## 2018-07-04 MED ORDER — LORAZEPAM 2 MG/ML IJ SOLN: 1.0000 mg | INTRAMUSCULAR | Status: DC | PRN

## 2018-07-04 MED ORDER — GLYCOPYRROLATE 0.2 MG/ML IJ SOLN: 0.2000 mg | INTRAMUSCULAR | Status: DC | PRN

## 2018-07-04 MED ORDER — BIOTENE DRY MOUTH MT LIQD: 15.0000 mL | OROMUCOSAL | Status: DC | PRN

## 2018-07-04 MED ORDER — SIMVASTATIN 20 MG PO TABS: 10.0000 mg | ORAL_TABLET | Freq: Every day | ORAL | Status: DC

## 2018-07-04 MED ORDER — PANTOPRAZOLE SODIUM 20 MG PO TBEC: 20.0000 mg | DELAYED_RELEASE_TABLET | Freq: Every day | ORAL | Status: DC

## 2018-07-04 MED ORDER — ORAL CARE MOUTH RINSE
15.0000 mL | Freq: Two times a day (BID) | OROMUCOSAL | Status: DC
  Administered 2018-07-04 (×2): 15 mL via OROMUCOSAL

## 2018-07-04 MED ORDER — SODIUM CHLORIDE 0.9 % IV BOLUS
1000.0000 mL | Freq: Once | INTRAVENOUS | Status: AC
  Administered 2018-07-04: 1000 mL via INTRAVENOUS

## 2018-07-04 MED ORDER — DEXTROSE 5 % IV SOLN
INTRAVENOUS | Status: DC
  Administered 2018-07-04: 13:00:00 via INTRAVENOUS

## 2018-07-04 MED ORDER — MORPHINE SULFATE (PF) 2 MG/ML IV SOLN
2.0000 mg | INTRAVENOUS | Status: DC | PRN
  Administered 2018-07-04 (×4): 2 mg via INTRAVENOUS
  Filled 2018-07-04 (×4): qty 1

## 2018-07-04 MED ORDER — FUROSEMIDE 20 MG PO TABS: 40.0000 mg | ORAL_TABLET | Freq: Every day | ORAL | Status: DC

## 2018-07-04 MED ORDER — SODIUM CHLORIDE 0.9 % IV SOLN
INTRAVENOUS | Status: DC
  Administered 2018-07-04: 03:00:00 via INTRAVENOUS

## 2018-07-04 NOTE — Progress Notes (Signed)
FPTS Interim Progress Note  Received page that patient had CBG less than 10.  Amp of D50 was given.  Went out to see patient.  At that time patient was back at mental baseline.  CBGs were 109.  O: BP (!) 182/83 (BP Location: Right Arm) Comment: Md notified  Pulse (!) 103   Temp 98.1 F (36.7 C) (Oral)   Resp 19   Ht 5' (1.524 m)   Wt 40.8 kg   SpO2 100%   BMI 17.58 kg/m   Gen: awake and alert, oriented only to self Card: RRR, no MRG Resp: CTAB, no respiratory distress GI: no bowel sounds  Neuro: oriented to self  A/P: Hypoglycemia -We will change fluids to D5 -monitor CBGs closely -CBG q2h -transfer to progressive  Caroline More, DO 07/04/2018, 1:11 PM PGY-2, [facility] Service pager [phone]

## 2018-07-04 NOTE — Progress Notes (Signed)
CRITICAL VALUE ALERT  Critical Value:  Latic acid = 3.0  Date & Time Notied:  07/04/2018 0225   Provider Notified: Dr. Shan Levans   Orders Received/Actions taken: awaiting response

## 2018-07-04 NOTE — ED Notes (Signed)
ED TO INPATIENT HANDOFF REPORT  ED Nurse Name and Phone #: [phone] Threasa Beards, RN  S Name/Age/Gender Bethany Cardenas 85 y.o. female Room/Bed: 033C/033C  Code Status   Code Status: Full Code  Home/SNF/Other Home Patient oriented to: self and time Is this baseline? Yes   Triage Complete: Triage complete  Chief Complaint vomiting/abd pain  Triage Note Pt arrives with [facility] from home; c/o generalized abdominal pain. EMS was called by pt's caregiver and reported pt has been nausea and vomitted in the bathroom. Pt is a&o x2 to person and situation and is at baseline. Pt has hx of HTN and afib. BP en route to ED was 220/80 on monitor, 180/100 manual. HR 90's, 96% O2 on RA. Pt was given 4mg  zofran and 250 ml NS bolus by EMS. CBG was 188.   Allergies No Known Allergies  Level of Care/Admitting Diagnosis ED Disposition    ED Disposition Condition [facility]: [place] [[id number]]  Level of Care: Med-Surg [16]  Covid Evaluation: N/A  Diagnosis: Small bowel ischemia [facility]) [[id number]]  Admitting Physician: Kathrene Alu [[id number]]  Attending Physician: Andrena Mews T [[id number]]  Estimated length of stay: past midnight tomorrow  Certification:: I certify this patient will need inpatient services for at least 2 midnights  PT Class (Do Not Modify): Inpatient [101]  PT Acc Code (Do Not Modify): Private [1]       B Medical/Surgery History Past Medical History:  Diagnosis Date  . Atherosclerotic cardiovascular disease   . Atrial fibrillation ([place])   . Carotid disease, bilateral ([place])   . CVA 03/12/2006   Qualifier: Diagnosis of  By: Eusebio Friendly    . Dyslipidemia   . Hypertrophic cardiomyopathy ([place])   . PVD (peripheral vascular disease) ([place])   . Stroke ([place])   . Systemic hypertension   . TOBACCO DEPENDENCE 03/12/2006   Qualifier: Diagnosis of  By: Eusebio Friendly     Past Surgical History:  Procedure Laterality Date  .  CARDIAC CATHETERIZATION  01/27/2006   total occlusion of the mid RCA,80% stenosis of the distal left main, total occlusion of the mid LAD following the first septal & first diagonal arteries,total occlusion of the left CX followint the first oblique marginal artery, reconstitution of the distal LAD and distal RCA via collaterals  . NM MYOCAR PERF WALL MOTION  11/25/2005   intermediate risk - mild inferolateral ischemia towards the apex extending to mid LV.  Marland Kitchen Chrisney     A IV Location/Drains/Wounds Patient Lines/Drains/Airways Status   Active Line/Drains/Airways    Name:   Placement date:   Placement time:   Site:   Days:   Peripheral IV 07/09/2018 Left Antecubital   06/22/2018    1845    Antecubital   1          Intake/Output Last 24 hours  Intake/Output Summary (Last 24 hours) at 07/04/2018 0056 Last data filed at 06/23/2018 2318 Gross per 24 hour  Intake 43.42 ml  Output -  Net 43.42 ml    Labs/Imaging Results for orders placed or performed during the hospital encounter of 06/22/2018 (from the past 48 hour(s))  CBC with Differential/Platelet     Status: Abnormal   Collection Time: 06/17/2018  7:25 PM  Result Value Ref Range   WBC 3.8 (L) 4.0 - 10.5 K/uL   RBC 4.14 3.87 - 5.11 MIL/uL   Hemoglobin 12.3 12.0 - 15.0 g/dL  HCT 40.6 36.0 - 46.0 %   MCV 98.1 80.0 - 100.0 fL   MCH 29.7 26.0 - 34.0 pg   MCHC 30.3 30.0 - 36.0 g/dL   RDW 14.2 11.5 - 15.5 %   Platelets 133 (L) 150 - 400 K/uL    Comment: REPEATED TO VERIFY SPECIMEN CHECKED FOR CLOTS    nRBC 0.0 0.0 - 0.2 %   Neutrophils Relative % 65 %   Neutro Abs 2.5 1.7 - 7.7 K/uL   Lymphocytes Relative 21 %   Lymphs Abs 0.8 0.7 - 4.0 K/uL   Monocytes Relative 11 %   Monocytes Absolute 0.4 0.1 - 1.0 K/uL   Eosinophils Relative 2 %   Eosinophils Absolute 0.1 0.0 - 0.5 K/uL   Basophils Relative 1 %   Basophils Absolute 0.0 0.0 - 0.1 K/uL   Immature Granulocytes 0 %   Abs Immature Granulocytes 0.01 0.00 - 0.07  K/uL    Comment: Performed at [facility], [address], [place], [place] [address]  Comprehensive metabolic panel     Status: Abnormal   Collection Time: 07/02/2018  7:25 PM  Result Value Ref Range   Sodium 143 135 - 145 mmol/L   Potassium 4.1 3.5 - 5.1 mmol/L   Chloride 108 98 - 111 mmol/L   CO2 23 22 - 32 mmol/L   Glucose, Bld 122 (H) 70 - 99 mg/dL   BUN 13 8 - 23 mg/dL   Creatinine, Ser 0.92 0.44 - 1.00 mg/dL   Calcium 8.9 8.9 - 10.3 mg/dL   Total Protein 7.8 6.5 - 8.1 g/dL   Albumin 3.6 3.5 - 5.0 g/dL   AST 28 15 - 41 U/L   ALT 16 0 - 44 U/L   Alkaline Phosphatase 72 38 - 126 U/L   Total Bilirubin 0.6 0.3 - 1.2 mg/dL   GFR calc non Af Amer 54 (L) >60 mL/min   GFR calc Af Amer >60 >60 mL/min   Anion gap 12 5 - 15    Comment: Performed at [facility] [address] [place], [place] [address]  Protime-INR     Status: Abnormal   Collection Time: 06/18/2018  7:25 PM  Result Value Ref Range   Prothrombin Time 18.3 (H) 11.4 - 15.2 seconds   INR 1.5 (H) 0.8 - 1.2    Comment: (NOTE) INR goal varies based on device and disease states. Performed at [facility], [place] [address], [place], [place] [address]   Troponin I - ONCE - STAT     Status: None   Collection Time: 06/14/2018  7:25 PM  Result Value Ref Range   Troponin I <0.03 <0.03 ng/mL    Comment: Performed at [facility], [place] [address], [place], [place] [address]  Lipase, blood     Status: None   Collection Time: 07/08/2018  7:25 PM  Result Value Ref Range   Lipase 37 11 - 51 U/L    Comment: Performed at [facility], [place] [address] [place], [place] [address]  Lactic acid, plasma     Status: Abnormal   Collection Time: 06/21/2018  7:25 PM  Result Value Ref Range   Lactic Acid, Venous 3.3 (HH) 0.5 - 1.9 mmol/L    Comment: CRITICAL RESULT CALLED TO, READ BACK BY AND VERIFIED WITH: Louretta Parma RN AT 2023 06/20/2018 BY [facility] Performed at [facility], [address], [place],  [place] [address]   SARS  Coronavirus 2 (CEPHEID - Performed in [facility] hospital lab), Hosp Order     Status: None   Collection Time: 07/07/2018 10:54 PM   Specimen: Nasopharyngeal Swab  Result Value Ref Range   SARS Coronavirus 2 NEGATIVE NEGATIVE    Comment: (NOTE) If result is NEGATIVE SARS-CoV-2 target nucleic acids are NOT DETECTED. The SARS-CoV-2 RNA is generally detectable in upper and lower  respiratory specimens during the acute phase of infection. The lowest  concentration of SARS-CoV-2 viral copies this assay can detect is 250  copies / mL. A negative result does not preclude SARS-CoV-2 infection  and should not be used as the sole basis for treatment or other  patient management decisions.  A negative result may occur with  improper specimen collection / handling, submission of specimen other  than nasopharyngeal swab, presence of viral mutation(s) within the  areas targeted by this assay, and inadequate number of viral copies  (<250 copies / mL). A negative result must be combined with clinical  observations, patient history, and epidemiological information. If result is POSITIVE SARS-CoV-2 target nucleic acids are DETECTED. The SARS-CoV-2 RNA is generally detectable in upper and lower  respiratory specimens dur ing the acute phase of infection.  Positive  results are indicative of active infection with SARS-CoV-2.  Clinical  correlation with patient history and other diagnostic information is  necessary to determine patient infection status.  Positive results do  not rule out bacterial infection or co-infection with other viruses. If result is PRESUMPTIVE POSTIVE SARS-CoV-2 nucleic acids MAY BE PRESENT.   A presumptive positive result was obtained on the submitted specimen  and confirmed on repeat testing.  While 2019 novel coronavirus  (SARS-CoV-2) nucleic acids may be present in the submitted sample  additional confirmatory testing may be necessary for epidemiological  and  / or clinical management purposes  to differentiate between  SARS-CoV-2 and other Sarbecovirus currently known to infect humans.  If clinically indicated additional testing with an alternate test  methodology [id number]) is advised. The SARS-CoV-2 RNA is generally  detectable in upper and lower respiratory sp ecimens during the acute  phase of infection. The expected result is Negative. Fact Sheet for Patients:  StrictlyIdeas.no Fact Sheet for Healthcare Providers: BankingDealers.co.za This test is not yet approved or cleared by the [place] FDA and has been authorized for detection and/or diagnosis of SARS-CoV-2 by FDA under an Emergency Use Authorization (EUA).  This EUA will remain in effect (meaning this test can be used) for the duration of the COVID-19 declaration under Section 564(b)(1) of the Act, 21 U.S.C. section 360bbb-3(b)(1), unless the authorization is terminated or revoked sooner. Performed at [facility], [place] [address], [place], [place] [address]    Ct Angio Abd/pel W And/or Wo Contrast  Result Date: 06/18/2018 CLINICAL DATA:  Severe abdominal pain tonight. Nausea and vomiting. History of atrial fibrillation. No anticoagulation. EXAM: CTA ABDOMEN AND PELVIS wITHOUT AND WITH CONTRAST TECHNIQUE: Multidetector CT imaging of the abdomen and pelvis was performed using the standard protocol during bolus administration of intravenous contrast. Multiplanar reconstructed images and MIPs were obtained and reviewed to evaluate the vascular anatomy. CONTRAST:  114mL OMNIPAQUE IOHEXOL 350 MG/ML SOLN COMPARISON:  None. FINDINGS: VASCULAR Aorta: Rounded thrombus in the upper abdominal aorta at the origin of the celiac artery. This measures approximately 8 x 16 mm. Diffuse calcified aortic atherosclerosis. Eccentric mural thrombus in the distal aorta causing approximately 30% luminal narrowing. Celiac: Thrombus occludes the origin over  course of  5 mm there is distal reconstitution. Hepatic artery is diminutive. No opacification of the splenic artery. SMA: Completely occluded from the origin over course of 5 cm. There is faint distal reconstitution, image 51 series 5. Renals: Atherosclerotic plaque causes 50% luminal narrowing of the right renal artery. Plaque at the origin of the left renal artery causes narrowing of greater than 50%. Small accessory left renal artery. IMA: Not visualized. Inflow: Prominently tortuous. Calcified noncalcified plaque at the origin of the right common iliac artery causes 50% luminal narrowing. Noncalcified plaque causes approximately 75% luminal narrowing of the more distal common iliac artery. Sluggish flow through the right external iliac artery which is best demonstrated on the venous phase. Plaque throughout the left common iliac artery with only mild stenosis. Slow flow in the left external iliac artery. Proximal Outflow: Occlusion of the right common femoral artery. Blood flow in the left common femoral artery with no flow in the left fem bypass graft. Minimal fluid noted in the left profundus femoral artery. Veins: Contrast refluxes into the hepatic veins and IVC. No IVC thrombus. Portal vein is patent. Review of the MIP images confirms the above findings. NON-VASCULAR Lower chest: Cardiomegaly with marked right heart dilatation. Mild right lung base atelectasis. Hepatobiliary: Heterogeneous hepatic perfusion. Contrast refluxes into the hepatic veins and IVC. Gallstone within the gallbladder. No biliary dilatation. Pancreas: No ductal dilatation or inflammation. Spleen: Normal in size with decreased density. Adrenals/Urinary Tract: Hyperenhancing kidneys. Left greater than right renal cortical scarring. No hydronephrosis. Hyperenhancing adrenal glands. No bladder wall thickening. Stomach/Bowel: Bowel evaluation limited in the absence of contrast and paucity of intra-abdominal fat. No gastric wall thickening.  Fluid-filled small bowel with questionable non enhancement of small bowel loops in the pelvis, image 44 and 48 series 11. Small bowel approaches left inguinal canal. Associated mesenteric edema. Fluid-filled colon without colonic wall thickening. Distal colonic diverticulosis without diverticulitis. No pneumatosis. Lymphatic: No bulky abdominopelvic adenopathy. Reproductive: Atrophic uterus tentatively visualized. No suspicious adnexal mass. Other: Generalized edema in the lower abdominal mesentery. No evidence of free air. Musculoskeletal: There are no acute or suspicious osseous abnormalities. IMPRESSION: VASCULAR 1. Complete occlusion of the SMA from the origin over 5 cm. Faint distal reconstitution. 2. Thrombus in the upper abdominal aorta occludes the origin of the celiac artery for approximately 5 mm, there is distal reconstitution. 3. No flow visualized in the IMA. 4. Diffuse calcified and noncalcified atheromatous plaque throughout the abdominal aorta. 5. Thrombus in the right common iliac and external iliac arteries. There is sluggish flow in the right external iliac artery which is best appreciated on venous phase imaging. The right common femoral artery appears occluded. Acuity of this is uncertain. 6. Left femoral bypass graft is occluded at the origin, partially included. Moderate atherosclerosis involving the left common and external iliac arteries. NON-VASCULAR 1. Findings suspicious for small bowel ischemia involving pelvic bowel loops with wall thickening and questionable areas of nonenhancement. 2. Hyperenhancing kidneys and adrenal glands, nonspecific but can be seen and shock. 3. Marked right heart dilatation. Contrast refluxing into the hepatic veins and IVC consistent with elevated right heart pressures. 4. Heterogeneous hepatic perfusion likely due to altered hemodynamics. 5. Incidental findings of cholelithiasis, bilateral renal cortical scarring, and colonic diverticulosis. Critical  Value/emergent results were called by telephone at the time of interpretation on 06/23/2018 at 9:43 pm to [facility] , who verbally acknowledged these results. Electronically Signed   By: Keith Rake M.D.   On: 07/04/2018 21:46  Pending Labs Unresulted Labs (From admission, onward)    Start     Ordered   2018/07/13 0500  Heparin level (unfractionated)  Daily,   R     06/23/2018 2155   07/04/18 0700  Heparin level (unfractionated)  Once-Timed,   STAT     07/10/2018 2155   07/04/18 0500  CBC  Daily,   R     07/02/2018 2155   07/04/18 2563  Basic metabolic panel  Tomorrow morning,   R     07/04/18 0016   07/04/18 0016  Lactic acid, plasma  Now then every 3 hours,   R (with STAT occurrences)     07/04/18 0016          Vitals/Pain Today's Vitals   07/04/18 0000 07/04/18 0015 07/04/18 0030 07/04/18 0045  BP: (!) 192/86 (!) 184/87 (!) 199/80 (!) 183/95  Pulse:      Resp: (!) 21 (!) 22 (!) 25 (!) 25  Temp:      TempSrc:      SpO2:      Weight:      Height:        Isolation Precautions No active isolations  Medications Medications  heparin ADULT infusion 100 units/mL (25000 units/251mL sodium chloride 0.45%) (650 Units/hr Intravenous New Bag/Given 06/26/2018 2228)  piperacillin-tazobactam (ZOSYN) IVPB 3.375 g (has no administration in time range)  fentaNYL (SUBLIMAZE) injection 25 mcg (25 mcg Intravenous Given 06/27/2018 2249)  umeclidinium-vilanterol (ANORO ELLIPTA) 62.5-25 MCG/INH 1 puff (has no administration in time range)  0.9 %  sodium chloride infusion (has no administration in time range)  iohexol (OMNIPAQUE) 350 MG/ML injection 100 mL (100 mLs Intravenous Contrast Given 07/11/2018 2105)  heparin bolus via infusion 2,000 Units (2,000 Units Intravenous Bolus from Bag 06/28/2018 2228)  piperacillin-tazobactam (ZOSYN) IVPB 3.375 g (0 g Intravenous Stopped 06/25/2018 2318)  sodium chloride 0.9 % bolus 500 mL (500 mLs Intravenous New Bag/Given 06/22/2018 2250)    Mobility walks with  device High fall risk   Focused Assessments GI   R Recommendations: See Admitting Provider Note  Report given to:   Additional Notes:

## 2018-07-04 NOTE — Progress Notes (Signed)
ANTICOAGULATION CONSULT NOTE - Follow Up Consult  Pharmacy Consult for IV heparin  Indication: mesenteric ischemia  No Known Allergies  Patient Measurements: Height: 5' (152.4 cm) Weight: 90 lb (40.8 kg) IBW/kg (Calculated) : 45.5 Heparin Dosing Weight: acutal body weight   Vital Signs: Temp: 98.1 F (36.7 C) (06/21 0443) Temp Source: Oral (06/21 0443) BP: 182/83 (06/21 0443) Pulse Rate: 103 (06/21 0443)  Labs: Recent Labs    07/09/2018 1925 07/04/18 0337 07/04/18 0708  HGB 12.3 12.1  --   HCT 40.6 37.9  --   PLT 133* 145*  --   LABPROT 18.3*  --   --   INR 1.5*  --   --   HEPARINUNFRC  --   --  0.53  CREATININE 0.92 0.82  --   TROPONINI <0.03  --   --     Estimated Creatinine Clearance: 27.6 mL/min (by C-G formula based on SCr of 0.82 mg/dL).   Assessment: 85 yo female with PMH of a-fib not on anticoagulation who presented with abdominal pain. CTA showed occlusion of the SMA, thrombus in the upper abdominal aorta that occludes the origin of the celiac artery, thrombus in the right common iliac and external iliac arteries, and no flow visualized in the IMA. Pharmacy consulted for heparin dosing.   AM heparin level therapeutic. Hgb WNL, Pltc slightly low but improved. No bleeding or infusion issues per nursing.   Goal of Therapy:  Heparin level 0.3-0.7 units/ml Monitor platelets by anticoagulation protocol: Yes   Plan:   Continue heparin infusion at 650 units/hr  Heparin level this afternoon to ensure remains within therapeutic range  Daily CBC, heparin level  Monitor closely for s/sx of bleeding    Lindell Spar, PharmD, BCPS Clinical Pharmacist 07/04/2018 9:44 AM

## 2018-07-04 NOTE — Progress Notes (Signed)
Spoke with the physician on-call for [facility].  He clarified that general surgery is not planning to move forward with surgical intervention and that he was not aware of a conversation with the family to confirm comfort care.  Surgery is recommending medical management from this point and encourages involvement of the palliative care team.  Ms. Kallenbach has not been transitioned to comfort care at this time.  Matilde Haymaker, MD

## 2018-07-04 NOTE — Progress Notes (Signed)
Hypoglycemic Event  CBG:<10  Treatment: 50 ml of Dextrose 50%  Symptoms: Lethargy and diaphoresis  Follow-up CBG: Time: 1245 CBG Result:109  Possible Reasons for Event: NPO status  Comments/MD notified:Dr Aliene Altes, Rodena Piety

## 2018-07-04 NOTE — Progress Notes (Signed)
FPTS Interim Progress Note  S: Patient denies pain.  She has received 2 doses of fentanyl overnight.  Detailed history is unable to be obtained due to patient's neurocognitive decline.  O: Patient sitting up in bed and appears comfortable.  Heart rate normal, rhythm irregular.  Lungs CTAB.  Abdomen very mildly tender to palpation, although difficult to assess given patient's neurocognitive status.  A/P: We will continue heparin gtt, Zosyn, normal saline infusion, and bowel rest.  Continue fentanyl 25 mcg every hour as needed for pain. Zosyn is an appropriate antibiotic for mesenteric ischemia due to its anaerobic and gram-negative coverage, with similar outcomes to that of metronidazole combined with a third generation cephalosporin or fluoroquinolone.  Appreciate vascular surgery and general surgery recommendations.  I spoke with Ms. Radoncic's niece and primary caregiver and power of attorney, Efraim Kaufmann at this morning.  She says that she has spoken with her other siblings, who have agreed to change her to DNR and to pursue comfort care if her status deteriorates.  She is also amenable to considering SNF placement or home health if determined to be the best course of action during this hospitalization.  I will place the order for PT/OT assessment.

## 2018-07-04 NOTE — Consult Note (Signed)
Hospital Consult    Reason for Consult: Mesenteric ischemia Referring Physician: ED MRN #:  [id number]  History of Present Illness: This is a 85 y.o. female with history of atrial fibrillation not on anticoagulation, peripheral vascular disease, stroke, dementia, tobacco dependence that vascular surgery has been consulted for possible mesenteric ischemia.  Per report patient arrived to the ED tonight with acute abdominal pain as well as several episodes of emesis.  The patient cannot provide any history as it relates to her presentation.  She reportedly lives with her niece.  In discussing with the patient, I cannot get any reliable history as far as alleviating or exacerbating factors.  I do not know if she has had any chronic abdominal pain.  She cannot tell if she has had any abdominal surgery. CTA was obtained that showed long occlusion SMA, thrombus covering origin of celiac artery and that artery occluded as well, and no IMA.  Past Medical History:  Diagnosis Date  . Atherosclerotic cardiovascular disease   . Atrial fibrillation ([place])   . Carotid disease, bilateral ([place])   . CVA 03/12/2006   Qualifier: Diagnosis of  By: Eusebio Friendly    . Dyslipidemia   . Hypertrophic cardiomyopathy ([place])   . PVD (peripheral vascular disease) ([place])   . Stroke ([place])   . Systemic hypertension   . TOBACCO DEPENDENCE 03/12/2006   Qualifier: Diagnosis of  By: Eusebio Friendly      Past Surgical History:  Procedure Laterality Date  . CARDIAC CATHETERIZATION  01/27/2006   total occlusion of the mid RCA,80% stenosis of the distal left main, total occlusion of the mid LAD following the first septal & first diagonal arteries,total occlusion of the left CX followint the first oblique marginal artery, reconstitution of the distal LAD and distal RCA via collaterals  . NM MYOCAR PERF WALL MOTION  11/25/2005   intermediate risk - mild inferolateral ischemia towards the apex extending to mid LV.  Marland Kitchen [place]    No Known Allergies  Prior to Admission medications   Medication Sig Start Date End Date Taking? Authorizing Provider  albuterol (PROVENTIL) (2.5 MG/3ML) 0.083% nebulizer solution USE 1 VAL IN NEBULIZER EVERY 6 HOURS AS NEEDED FOR WHEEZING OR SHORTNESS OF BREATH 05/18/18   Rory Percy, DO  aspirin (ADULT ASPIRIN EC LOW STRENGTH) 81 MG EC tablet Take 81 mg by mouth daily.     [provider]  cetirizine (ZYRTEC) 5 MG tablet TAKE 1 TABLET (5 MG TOTAL) BY MOUTH AT BEDTIME. 06/12/16   Ronnie Doss M, DO  ergocalciferol (VITAMIN D2) 50000 UNITS capsule Take 50,000 Units by mouth once a week. Unknown day of the week    [provider]  furosemide (LASIX) 40 MG tablet Take 1 tablet (40 mg total) by mouth daily. 02/09/17   Eloise Levels, MD  metoprolol succinate (TOPROL-XL) 100 MG 24 hr tablet TAKE 1 TABLET (100 MG TOTAL) DAILY BY MOUTH. TAKE WITH OR IMMEDIATELY FOLLOWING A MEAL. 09/21/17 09/16/18  Croitoru, Mihai, MD  pantoprazole (PROTONIX) 20 MG tablet TAKE 1 TABLET BY MOUTH EVERY DAY 06/08/18   Sherene Sires, DO  simvastatin (ZOCOR) 40 MG tablet TAKE ONE-HALF TABLET BY MOUTH EVERY NIGHT AT BEDTIME 09/29/17   Bland, Scott, DO  umeclidinium-vilanterol (ANORO ELLIPTA) 62.5-25 MCG/INH AEPB Inhale 1 puff into the lungs daily. 11/10/17   Sherene Sires, DO    Social History   Socioeconomic History  . Marital status: Widowed    Spouse name: Not  on file  . Number of children: Not on file  . Years of education: Not on file  . Highest education level: Not on file  Occupational History  . Not on file  Social Needs  . Financial resource strain: Not on file  . Food insecurity    Worry: Not on file    Inability: Not on file  . Transportation needs    Medical: Not on file    Non-medical: Not on file  Tobacco Use  . Smoking status: Former Smoker    Packs/day: 0.50    Years: 50.00    Pack years: 25.00    Types: Cigarettes    Start date: 01/13/1949    Quit  date: 01/14/1999    Years since quitting: 19.4  . Smokeless tobacco: Never Used  Substance and Sexual Activity  . Alcohol use: No    Alcohol/week: 0.0 standard drinks  . Drug use: No  . Sexual activity: Never  Lifestyle  . Physical activity    Days per week: Not on file    Minutes per session: Not on file  . Stress: Not on file  Relationships  . Social Herbalist on phone: Not on file    Gets together: Not on file    Attends religious service: Not on file    Active member of club or organization: Not on file    Attends meetings of clubs or organizations: Not on file    Relationship status: Not on file  . Intimate partner violence    Fear of current or ex partner: Not on file    Emotionally abused: Not on file    Physically abused: Not on file    Forced sexual activity: Not on file  Other Topics Concern  . Not on file  Social History Narrative  . Not on file     Family History  Problem Relation Age of Onset  . Diabetes Father   . Cancer Sister   . Hypertension Sister     ROS: [x]  Positive   [ ]  Negative   [ ]  All sytems reviewed and are negative  Cardiovascular: []  chest pain/pressure []  palpitations []  SOB lying flat []  DOE []  pain in legs while walking []  pain in legs at rest []  pain in legs at night []  non-healing ulcers []  hx of DVT []  swelling in legs  Pulmonary: []  productive cough []  asthma/wheezing []  home O2  Neurologic: []  weakness in []  arms []  legs []  numbness in []  arms []  legs []  hx of CVA []  mini stroke [] difficulty speaking or slurred speech []  temporary loss of vision in one eye []  dizziness  Hematologic: []  hx of cancer []  bleeding problems []  problems with blood clotting easily  Endocrine:   []  diabetes []  thyroid disease  GI []  vomiting blood []  blood in stool X Abdominal pain  GU: []  CKD/renal failure []  HD--[]  M/W/F or []  T/T/S []  burning with urination []  blood in urine  Psychiatric: []  anxiety []   depression  Musculoskeletal: []  arthritis []  joint pain  Integumentary: []  rashes []  ulcers  Constitutional: []  fever []  chills   Physical Examination  Vitals:   06/19/2018 2030 06/16/2018 2045  BP: (!) 183/94 (!) 188/93  Pulse:    Resp: 18 19  Temp:    SpO2:     Body mass index is 17.58 kg/m.  General:  Resting in ED, frail, thin, on nasal oxygen Gait: Not observed HENT: WNL, normocephalic Pulmonary: normal non-labored breathing,  without Rales, rhonchi,  wheezing Cardiac: irregular Abdomen: soft, no rebound or guarding on my exam, no frank peritonitis Vascular Exam/Pulses:  Right Left  Radial Cannot palpate Cannot palpate  Ulnar    Femoral Cannot palpate Weak pulse 1+  Popliteal    DP    PT     Extremities: without ischemic changes, without Gangrene , without cellulitis; without open wounds;  Musculoskeletal: no muscle wasting or atrophy  Neurologic: A&O X 3; Appropriate Affect ; SENSATION: normal; MOTOR FUNCTION:  moving all extremities equally. Speech is fluent/normal   CBC    Component Value Date/Time   WBC 3.8 (L) 06/20/2018 1925   RBC 4.14 06/15/2018 1925   HGB 12.3 07/04/2018 1925   HCT 40.6 06/20/2018 1925   PLT 133 (L) 07/12/2018 1925   MCV 98.1 07/12/2018 1925   MCH 29.7 06/30/2018 1925   MCHC 30.3 06/17/2018 1925   RDW 14.2 06/19/2018 1925   LYMPHSABS 0.8 06/16/2018 1925   MONOABS 0.4 06/24/2018 1925   EOSABS 0.1 07/13/2018 1925   BASOSABS 0.0 07/12/2018 1925    BMET    Component Value Date/Time   NA 143 07/04/2018 1925   NA 141 11/10/2017 1533   K 4.1 07/11/2018 1925   CL 108 07/10/2018 1925   CO2 23 07/06/2018 1925   GLUCOSE 122 (H) 06/17/2018 1925   BUN 13 07/04/2018 1925   BUN 12 11/10/2017 1533   CREATININE 0.92 06/30/2018 1925   CREATININE 0.93 (H) 11/13/2014 1106   CALCIUM 8.9 06/24/2018 1925   GFRNONAA 54 (L) 06/21/2018 1925   GFRNONAA 54 (L) 11/13/2014 1106   GFRAA >60 07/06/2018 1925   GFRAA 63 11/13/2014 1106     COAGS: Lab Results  Component Value Date   INR 1.5 (H) 07/08/2018     Non-Invasive Vascular Imaging:    CTA abdomen pelvis: On my review there appears to be a large burden of thrombus in the aorta at the origin of the celiac artery and the ostium of the artery itself is very diseased and appears occluded on saggital imaging.  The SMA is occluded throughout its course for approximately 5 cm and is heavily calcified there is very faint distal reconstitution of a small target.  I do not see any IMA.  She has severe bilateral iliac disease and very tortuous vessels distally.   ASSESSMENT/PLAN: This is a 85 y.o. female that presents with likely acute on chronic mesenteric ischemia.  I cannot get any reliable history from the patient and she seems very frail and debilitated.  Chart indicates underlying dementia as well.  I did try and call her niece Inez Catalina who she lives with and did not get any answer on the numbers listed.  I agree with general surgery recommendations for IV fluids, IV antibiotics and IV heparin.  Also agree with palliative care consult for goals of care discussion.  Any attempt at mesenteric revascularization in her would be complex.  I do not think she has any straightforward endovascular options.  Most reliable option for her would likely be a supraceliac aorta to celiac bypass (iliacs are severely diseased for retrograde bypass) where there is a decent target.  However in her frail debilitated state with underlying dementia, I would not recommend complex mesenteric reconstruction.  Marty Heck, MD [facility] Office: [phone] Pager: [place]

## 2018-07-04 NOTE — Progress Notes (Signed)
[facility]   Subjective  -states her abdomen feels better but this is in the setting of significant dementia and neurocognitive decline.   Objective (!) 182/83 (!) 103 98.1 F (36.7 C) (Oral) 19 100%  Intake/Output Summary (Last 24 hours) at 07/04/2018 0949 Last data filed at 07/04/2018 0900 Gross per 24 hour  Intake 903.3 ml  Output 125 ml  Net 778.3 ml    Right femoral pulse nonpalpable left femoral pulse weak Abdomen soft nondistended there is no rebound or guarding no other signs of peritonitis  Laboratory Lab Results: Recent Labs    06/27/2018 1925 07/04/18 0337  WBC 3.8* 13.4*  HGB 12.3 12.1  HCT 40.6 37.9  PLT 133* 145*   BMET Recent Labs    06/15/2018 1925 07/04/18 0337  NA 143 141  K 4.1 4.1  CL 108 105  CO2 23 23  GLUCOSE 122* 115*  BUN 13 10  CREATININE 0.92 0.82  CALCIUM 8.9 8.8*    COAG Lab Results  Component Value Date   INR 1.5 (H) 06/23/2018   No results found for: PTT  Assessment/Planning:  I was able to contact Ms. Dantes niece this morning (had attempted to call her last night and did not reach her) to discuss her current condition from a vascular surgery standpoint.  I went over the findings on the CT scan and my suspicion for acute on chronic mesenteric ischemia.  Again discussed options for therapy as documented last night in my consultation note.  Again went over that I think her best surgical option would be a supraceliac aorta to celiac bypass which at 61 with dementia, significant neurocognitive decline, and multiple other medical issues probably is not in her best interest for overall quality of life.  Patient's niece is in complete agreement and states that Ms. Georgiades has been in significant decline recently.  I also discussed with general surgery who is in the same agreement and has no plans for laparotomy.  We have recommended antibiotics IV heparin and IV fluids and palliative care  engagement.  Marty Heck 07/04/2018 9:49 AM --

## 2018-07-04 NOTE — Consult Note (Signed)
Consultation Note Date: 07/04/2018   Patient Name: Bethany Cardenas  DOB: 28-Jan-1922  MRN: [id number]  Age / Sex: 85 y.o., female  PCP: Sherene Sires, DO Referring Physician: No att. providers found  Reason for Consultation: Establishing goals of care  HPI/Patient Profile: 85 y.o. female  with past medical history of HTN, CVA, hypertrophic cardiomyopathy, HLD, a fib, dementia, and CHF admitted on 07/09/2018 with abdominal pain. Diagnosed with probable mesenteric ischemia with intestinal ischemia. Surgery not recommended. Today patient with increasing lactic acid -  Up to 8.4. Also with hypoglycemic episodes. PMT consulted for [place].  Clinical Assessment and Goals of Care: I have reviewed medical records including EPIC notes, labs and imaging, received report from RN, and then spoke with patient's niece/caregiver, Efraim Kaufmann,  to discuss diagnosis prognosis, GOC, EOL wishes, disposition and options.  I introduced Palliative Medicine as specialized medical care for people living with serious illness. It focuses on providing relief from the symptoms and stress of a serious illness. The goal is to improve quality of life for both the patient and the family.  Inez Catalina tells me the patient lives with her and her sister.   As far as functional and nutritional status, she tells me the patient could complete basic ADLs but needed assistance with meals, household chores, etc. She also tells me of a decline in appetite. She has noticed cognitive decline as well - still knew her family members.    We discussed her current illness and what it means in the larger context of her on-going co-morbidities.  Natural disease trajectory and expectations at EOL were discussed. Inez Catalina has good understanding - we discussed that patient is not a surgical candidate and is likely nearing end of life. We discussed prognosis could be hours - days. She expresses understanding.   The  difference between aggressive medical intervention and comfort care was considered in light of the patient's goals of care. We discussed focusing on patient's comfort given situation and Inez Catalina agrees this is appropriate.  Hospice and Palliative Care services outpatient were explained and offered. We discussed seeing how patient is tomorrow - if stable enough for transfer, could evaluate for transfer to hospice home. Inez Catalina agrees with this.   Questions and concerns were addressed. The family was encouraged to call with questions or concerns.  Primary Decision Maker NEXT OF KIN - niece - Efraim Kaufmann    SUMMARY OF RECOMMENDATIONS   - full comfort care - discussed poor prognosis with niece - discussed possible transfer to hospice facility tomorrow depending on patient condition  Code Status/Advance Care Planning:  DNR   Symptom Management:   PRN morphine  PRN ativan  PRN robinul  Palliative Prophylaxis:   Aspiration, Delirium Protocol, Frequent Pain Assessment, Oral Care and Turn Reposition  Additional Recommendations (Limitations, Scope, Preferences):  Full Comfort Care  Psycho-social/Spiritual:   Desire for further Chaplaincy support:no  Additional Recommendations: Education on Hospice  Prognosis:   Hours - Days  Discharge Planning: To Be Determined      Primary Diagnoses: Present on Admission: **None**   I have reviewed the medical record, interviewed the patient and family, and examined the patient. The following aspects are pertinent.  Past Medical History:  Diagnosis Date  . Atherosclerotic cardiovascular disease   . Atrial fibrillation ([place])   . Carotid disease, bilateral ([place])   . CVA 03/12/2006   Qualifier: Diagnosis of  By: Eusebio Friendly    . Dyslipidemia   . Hypertrophic cardiomyopathy ([place])   . PVD (  peripheral vascular disease) ([place])   . Stroke ([place])   . Systemic hypertension   . TOBACCO DEPENDENCE 03/12/2006   Qualifier: Diagnosis of   By: Eusebio Friendly     Social History   Socioeconomic History  . Marital status: Widowed    Spouse name: Not on file  . Number of children: Not on file  . Years of education: Not on file  . Highest education level: Not on file  Occupational History  . Not on file  Social Needs  . Financial resource strain: Not on file  . Food insecurity    Worry: Not on file    Inability: Not on file  . Transportation needs    Medical: Not on file    Non-medical: Not on file  Tobacco Use  . Smoking status: Former Smoker    Packs/day: 0.50    Years: 50.00    Pack years: 25.00    Types: Cigarettes    Start date: 01/13/1949    Quit date: 01/14/1999    Years since quitting: 19.4  . Smokeless tobacco: Never Used  Substance and Sexual Activity  . Alcohol use: No    Alcohol/week: 0.0 standard drinks  . Drug use: No  . Sexual activity: Never  Lifestyle  . Physical activity    Days per week: Not on file    Minutes per session: Not on file  . Stress: Not on file  Relationships  . Social Herbalist on phone: Not on file    Gets together: Not on file    Attends religious service: Not on file    Active member of club or organization: Not on file    Attends meetings of clubs or organizations: Not on file    Relationship status: Not on file  Other Topics Concern  . Not on file  Social History Narrative  . Not on file   Family History  Problem Relation Age of Onset  . Diabetes Father   . Cancer Sister   . Hypertension Sister    Scheduled Meds: . mouth rinse  15 mL Mouth Rinse BID   Continuous Infusions: PRN Meds:.antiseptic oral rinse, glycopyrrolate **OR** glycopyrrolate **OR** glycopyrrolate, LORazepam **OR** LORazepam **OR** LORazepam, morphine injection No Known Allergies  Vital Signs: BP (!) 182/83 (BP Location: Right Arm) Comment: Md notified  Pulse (!) 103   Temp 98.1 F (36.7 C) (Oral)   Resp 19   Ht 5' (1.524 m)   Wt 40.8 kg   SpO2 100%   BMI 17.58 kg/m   Pain Scale: PAINAD   Pain Score: 1    SpO2: SpO2: 100 % O2 Device:SpO2: 100 % O2 Flow Rate: .O2 Flow Rate (L/min): 3 L/min  IO: Intake/output summary:   Intake/Output Summary (Last 24 hours) at 07/04/2018 1344 Last data filed at 07/04/2018 0900 Gross per 24 hour  Intake 903.3 ml  Output 125 ml  Net 778.3 ml    LBM: Last BM Date: 07/04/18 Baseline Weight: Weight: 40.8 kg Most recent weight: Weight: 40.8 kg     Palliative Assessment/Data: PPS 10%    The above conversation was completed via telephone due to the visitor restrictions during the COVID-19 pandemic. Thorough chart review and discussion with necessary members of the care team was completed as part of assessment. All issues were discussed and addressed but no physical exam was performed.  Time Total: 60 minutes Greater than 50%  of this time was spent counseling and coordinating care related to the above  assessment and plan.  Juel Burrow, DNP, AGNP-C Palliative Medicine Team [phone] Pager: [phone]

## 2018-07-04 NOTE — Progress Notes (Signed)
CRITICAL VALUE ALERT  Critical Value:  Lactic acid = 3.7   Date & Time Notied:  07/04/2018 0420  Provider Notified: Dr. Shan Levans  Orders Received/Actions taken: Awaiting response

## 2018-07-04 NOTE — Progress Notes (Signed)
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 Carlis Abbott Elevated lactate - increased to 5.0 this AM Rt common iliac & ext iliac thrombus & femoral art occlusion  Plans noted for DNR status and moving to comfort/palliative care measures.  Operative intervention is not anticipated or recommended.  Will sign off.  Call if general surgery can assist in any way.        Armandina Gemma, MD       [facility]       Office: [phone]   Chief Complaint: Abdominal pain, mesenteric ischemia  Subjective: Patient in bed, mumbles incoherently, pleasant.  Does not appear to be in pain.  Objective: Vital signs in last 24 hours: Temp:  [97.5 F (36.4 C)-98.1 F (36.7 C)] 98.1 F (36.7 C) (06/21 0443) Pulse Rate:  [84-108] 103 (06/21 0443) Resp:  [14-29] 19 (06/21 0443) BP: (179-199)/(80-103) 182/83 (06/21 0443) SpO2:  [95 %-100 %] 100 % (06/21 0443) Weight:  [40.8 kg] 40.8 kg (06/20 1840) Last BM Date: 07/04/18  Intake/Output from previous day: 06/20 0701 - 06/21 0700 In: 903.3 [I.V.:309.9; IV Piggyback:593.4] Out: 125 [Urine:125] Intake/Output this shift: No intake/output data recorded.  Physical Exam: HEENT - sclerae clear, mucous membranes moist Neck - soft Chest - clear bilaterally Cor - irregular, rate controlled Abdomen - soft, mild distension; non-tender Ext - no edema, non-tender  Lab Results:  Recent Labs    06/21/2018 1925 07/04/18 0337  WBC 3.8* 13.4*  HGB 12.3 12.1  HCT 40.6 37.9  PLT 133* 145*   BMET Recent Labs    07/04/2018 1925 07/04/18 0337  NA 143 141  K 4.1 4.1  CL 108 105  CO2 23 23  GLUCOSE 122* 115*  BUN 13 10  CREATININE 0.92 0.82  CALCIUM 8.9 8.8*   PT/INR Recent Labs    07/01/2018 1925  LABPROT 18.3*  INR 1.5*    Comprehensive Metabolic Panel:    Component Value Date/Time   NA 141 07/04/2018 0337   NA 143 06/24/2018 1925   NA 141 11/10/2017 1533   K 4.1 07/04/2018 0337   K 4.1 07/08/2018 1925   CL 105 07/04/2018 0337   CL 108 06/26/2018 1925   CO2 23 07/04/2018 0337   CO2 23 06/14/2018 1925   BUN 10 07/04/2018 0337   BUN 13 07/04/2018 1925   BUN 12 11/10/2017 1533   CREATININE 0.82 07/04/2018 0337   CREATININE 0.92 06/29/2018 1925   CREATININE 0.93 (H) 11/13/2014 1106   CREATININE 0.91 11/23/2013 1616   GLUCOSE 115 (H) 07/04/2018 0337   GLUCOSE 122 (H) 06/25/2018 1925   CALCIUM 8.8 (L) 07/04/2018 0337   CALCIUM 8.9 07/09/2018 1925   AST 28 07/10/2018 1925   AST 19 11/13/2014 1106   ALT 16 07/02/2018 1925   ALT 8 11/13/2014 1106   ALKPHOS 72 06/17/2018 1925   ALKPHOS 60 11/13/2014 1106   BILITOT 0.6 06/22/2018 1925   BILITOT 0.8 11/13/2014 1106   PROT 7.8 07/10/2018 1925   PROT 7.6 11/13/2014 1106   ALBUMIN 3.6 07/04/2018 1925   ALBUMIN 4.4 11/13/2014 1106    Studies/Results: Ct Angio Abd/pel W And/or Wo Contrast  Result Date: 06/15/2018 CLINICAL DATA:  Severe abdominal pain tonight. Nausea and vomiting. History of atrial fibrillation. No anticoagulation. EXAM: CTA ABDOMEN AND  PELVIS wITHOUT AND WITH CONTRAST TECHNIQUE: Multidetector CT imaging of the abdomen and pelvis was performed using the standard protocol during bolus administration of intravenous contrast. Multiplanar reconstructed images and MIPs were obtained and reviewed to evaluate the vascular anatomy. CONTRAST:  112mL OMNIPAQUE IOHEXOL 350 MG/ML SOLN COMPARISON:  None. FINDINGS: VASCULAR Aorta: Rounded thrombus in the upper abdominal aorta at the origin of the celiac artery. This measures approximately 8 x 16 mm. Diffuse calcified aortic atherosclerosis. Eccentric mural thrombus in the distal aorta causing approximately 30% luminal narrowing. Celiac: Thrombus occludes the origin over course of 5 mm there is distal  reconstitution. Hepatic artery is diminutive. No opacification of the splenic artery. SMA: Completely occluded from the origin over course of 5 cm. There is faint distal reconstitution, image 51 series 5. Renals: Atherosclerotic plaque causes 50% luminal narrowing of the right renal artery. Plaque at the origin of the left renal artery causes narrowing of greater than 50%. Small accessory left renal artery. IMA: Not visualized. Inflow: Prominently tortuous. Calcified noncalcified plaque at the origin of the right common iliac artery causes 50% luminal narrowing. Noncalcified plaque causes approximately 75% luminal narrowing of the more distal common iliac artery. Sluggish flow through the right external iliac artery which is best demonstrated on the venous phase. Plaque throughout the left common iliac artery with only mild stenosis. Slow flow in the left external iliac artery. Proximal Outflow: Occlusion of the right common femoral artery. Blood flow in the left common femoral artery with no flow in the left fem bypass graft. Minimal fluid noted in the left profundus femoral artery. Veins: Contrast refluxes into the hepatic veins and IVC. No IVC thrombus. Portal vein is patent. Review of the MIP images confirms the above findings. NON-VASCULAR Lower chest: Cardiomegaly with marked right heart dilatation. Mild right lung base atelectasis. Hepatobiliary: Heterogeneous hepatic perfusion. Contrast refluxes into the hepatic veins and IVC. Gallstone within the gallbladder. No biliary dilatation. Pancreas: No ductal dilatation or inflammation. Spleen: Normal in size with decreased density. Adrenals/Urinary Tract: Hyperenhancing kidneys. Left greater than right renal cortical scarring. No hydronephrosis. Hyperenhancing adrenal glands. No bladder wall thickening. Stomach/Bowel: Bowel evaluation limited in the absence of contrast and paucity of intra-abdominal fat. No gastric wall thickening. Fluid-filled small bowel with  questionable non enhancement of small bowel loops in the pelvis, image 44 and 48 series 11. Small bowel approaches left inguinal canal. Associated mesenteric edema. Fluid-filled colon without colonic wall thickening. Distal colonic diverticulosis without diverticulitis. No pneumatosis. Lymphatic: No bulky abdominopelvic adenopathy. Reproductive: Atrophic uterus tentatively visualized. No suspicious adnexal mass. Other: Generalized edema in the lower abdominal mesentery. No evidence of free air. Musculoskeletal: There are no acute or suspicious osseous abnormalities. IMPRESSION: VASCULAR 1. Complete occlusion of the SMA from the origin over 5 cm. Faint distal reconstitution. 2. Thrombus in the upper abdominal aorta occludes the origin of the celiac artery for approximately 5 mm, there is distal reconstitution. 3. No flow visualized in the IMA. 4. Diffuse calcified and noncalcified atheromatous plaque throughout the abdominal aorta. 5. Thrombus in the right common iliac and external iliac arteries. There is sluggish flow in the right external iliac artery which is best appreciated on venous phase imaging. The right common femoral artery appears occluded. Acuity of this is uncertain. 6. Left femoral bypass graft is occluded at the origin, partially included. Moderate atherosclerosis involving the left common and external iliac arteries. NON-VASCULAR 1. Findings suspicious for small bowel ischemia involving pelvic bowel loops with wall thickening and questionable areas  of nonenhancement. 2. Hyperenhancing kidneys and adrenal glands, nonspecific but can be seen and shock. 3. Marked right heart dilatation. Contrast refluxing into the hepatic veins and IVC consistent with elevated right heart pressures. 4. Heterogeneous hepatic perfusion likely due to altered hemodynamics. 5. Incidental findings of cholelithiasis, bilateral renal cortical scarring, and colonic diverticulosis. Critical Value/emergent results were called by  telephone at the time of interpretation on 06/28/2018 at 9:43 pm to PA [facility] , who verbally acknowledged these results. Electronically Signed   By: Keith Rake M.D.   On: 07/10/2018 21:46      Armandina Gemma 07/04/2018  Patient ID: Bethany Cardenas, female   DOB: 10-27-1922, 85 y.o.   MRN: [id number]

## 2018-07-05 ENCOUNTER — Other Ambulatory Visit: Payer: Self-pay | Admitting: Cardiovascular Disease

## 2018-07-05 DIAGNOSIS — Z515 Encounter for palliative care: Secondary | ICD-10-CM

## 2018-07-14 NOTE — Progress Notes (Signed)
Received patient expired and was awaiting for a family member to call back.  Family came to see the body and removed and took all her belongings.  Body now transported to the morgue.

## 2018-07-14 NOTE — Progress Notes (Signed)
MD notified of passing.  Called niece Efraim Kaufmann at [phone].  Voicemail answered.  Left voicemail for Ms. Walker to contact me ASAP. Waiting for call back.

## 2018-07-14 NOTE — Progress Notes (Signed)
Tried to contact family members again to provide update on patient's status (of patient passing).  Answering machine picked up again.  Left message for Ms. Efraim Kaufmann to contact the unit at [phone].

## 2018-07-14 NOTE — Death Summary Note (Signed)
[facility] Death Summary  Patient name: Bethany Cardenas Medical record number: [id number] Date of birth: 07-Sep-1922 Age: 85 y.o. Gender: female Date of Admission: 07-09-18  Date of Death: 07/11/2018 Admitting Physician: Kinnie Feil, MD  Primary Care Provider: Sherene Sires, DO Consultants: Vascular surgery, general surgery, palliative care  Indication for Hospitalization: acute abdominal pain  Discharge Diagnoses/Problem List:  Mesenteric ischemia Abdominal Pain Chronic atrial fibrillation COPD HFpEF PAD CAD CVA HLD Dementia  Disposition: Death  Brief Hospital Course:  Bethany Cardenas is a 85 y.o. female with past medical history significant for permanent atrial fibrillation not on anticoagulation, HFpEF, HLD, CAD, PAD, COPD, HTN, HLD, and deme who presented with acute abdominal pain and found to have acute on chronic mesenteric ischemia with involvement of intestine noted CT abdomen/pelvis. She was initially started on IV Zosyn, maintenance IV fluids and heparin drip. Vascular and general surgery were consulted and recommend against surgery given patient's age, frailty, and comorbidities. Palliative care was consulted and after extensive discussion with family concerning prognosis, family and patient opted for full comfort care on 6/21. Antibiotics and heparin were discontinued and full efforts were made to ensure comfort during patient's hospital stay. The patient comfortably expired at 0525.   Significant Procedures: None  Significant Labs and Imaging:  Lactic Acid: 3.3>3.0>3.7>5>6.6>8.4 COVID negative PT/INR: 18.3/1.5  Ct Angio Abd/pel W And/or Wo Contrast  Result Date: 09-Jul-2018 CLINICAL DATA:  Severe abdominal pain tonight. Nausea and vomiting. History of atrial fibrillation. No anticoagulation. EXAM: CTA ABDOMEN AND PELVIS wITHOUT AND WITH CONTRAST TECHNIQUE: Multidetector CT imaging of the abdomen and pelvis was performed using the  standard protocol during bolus administration of intravenous contrast. Multiplanar reconstructed images and MIPs were obtained and reviewed to evaluate the vascular anatomy. CONTRAST:  137mL OMNIPAQUE IOHEXOL 350 MG/ML SOLN COMPARISON:  None. FINDINGS: VASCULAR Aorta: Rounded thrombus in the upper abdominal aorta at the origin of the celiac artery. This measures approximately 8 x 16 mm. Diffuse calcified aortic atherosclerosis. Eccentric mural thrombus in the distal aorta causing approximately 30% luminal narrowing. Celiac: Thrombus occludes the origin over course of 5 mm there is distal reconstitution. Hepatic artery is diminutive. No opacification of the splenic artery. SMA: Completely occluded from the origin over course of 5 cm. There is faint distal reconstitution, image 51 series 5. Renals: Atherosclerotic plaque causes 50% luminal narrowing of the right renal artery. Plaque at the origin of the left renal artery causes narrowing of greater than 50%. Small accessory left renal artery. IMA: Not visualized. Inflow: Prominently tortuous. Calcified noncalcified plaque at the origin of the right common iliac artery causes 50% luminal narrowing. Noncalcified plaque causes approximately 75% luminal narrowing of the more distal common iliac artery. Sluggish flow through the right external iliac artery which is best demonstrated on the venous phase. Plaque throughout the left common iliac artery with only mild stenosis. Slow flow in the left external iliac artery. Proximal Outflow: Occlusion of the right common femoral artery. Blood flow in the left common femoral artery with no flow in the left fem bypass graft. Minimal fluid noted in the left profundus femoral artery. Veins: Contrast refluxes into the hepatic veins and IVC. No IVC thrombus. Portal vein is patent. Review of the MIP images confirms the above findings. NON-VASCULAR Lower chest: Cardiomegaly with marked right heart dilatation. Mild right lung base  atelectasis. Hepatobiliary: Heterogeneous hepatic perfusion. Contrast refluxes into the hepatic veins and IVC. Gallstone within the gallbladder. No biliary dilatation. Pancreas: No ductal  dilatation or inflammation. Spleen: Normal in size with decreased density. Adrenals/Urinary Tract: Hyperenhancing kidneys. Left greater than right renal cortical scarring. No hydronephrosis. Hyperenhancing adrenal glands. No bladder wall thickening. Stomach/Bowel: Bowel evaluation limited in the absence of contrast and paucity of intra-abdominal fat. No gastric wall thickening. Fluid-filled small bowel with questionable non enhancement of small bowel loops in the pelvis, image 44 and 48 series 11. Small bowel approaches left inguinal canal. Associated mesenteric edema. Fluid-filled colon without colonic wall thickening. Distal colonic diverticulosis without diverticulitis. No pneumatosis. Lymphatic: No bulky abdominopelvic adenopathy. Reproductive: Atrophic uterus tentatively visualized. No suspicious adnexal mass. Other: Generalized edema in the lower abdominal mesentery. No evidence of free air. Musculoskeletal: There are no acute or suspicious osseous abnormalities. IMPRESSION: VASCULAR 1. Complete occlusion of the SMA from the origin over 5 cm. Faint distal reconstitution. 2. Thrombus in the upper abdominal aorta occludes the origin of the celiac artery for approximately 5 mm, there is distal reconstitution. 3. No flow visualized in the IMA. 4. Diffuse calcified and noncalcified atheromatous plaque throughout the abdominal aorta. 5. Thrombus in the right common iliac and external iliac arteries. There is sluggish flow in the right external iliac artery which is best appreciated on venous phase imaging. The right common femoral artery appears occluded. Acuity of this is uncertain. 6. Left femoral bypass graft is occluded at the origin, partially included. Moderate atherosclerosis involving the left common and external iliac  arteries. NON-VASCULAR 1. Findings suspicious for small bowel ischemia involving pelvic bowel loops with wall thickening and questionable areas of nonenhancement. 2. Hyperenhancing kidneys and adrenal glands, nonspecific but can be seen and shock. 3. Marked right heart dilatation. Contrast refluxing into the hepatic veins and IVC consistent with elevated right heart pressures. 4. Heterogeneous hepatic perfusion likely due to altered hemodynamics. 5. Incidental findings of cholelithiasis, bilateral renal cortical scarring, and colonic diverticulosis. Critical Value/emergent results were called by telephone at the time of interpretation on 06/19/2018 at 9:43 pm to PA [facility] , who verbally acknowledged these results. Electronically Signed   By: Keith Rake M.D.   On: 06/21/2018 21:46    Danna Hefty, DO July 23, 2018, 6:05 AM PGY-1, [place]

## 2018-07-14 NOTE — Progress Notes (Signed)
Patient passed this morning at 0525.  Two RN's verified no heart beat present.

## 2018-07-14 DEATH — deceased

## 2018-07-22 ENCOUNTER — Encounter (HOSPITAL_COMMUNITY): Payer: Medicare Other

## 2018-08-11 ENCOUNTER — Other Ambulatory Visit: Payer: Self-pay | Admitting: Family Medicine
